# Patient Record
Sex: Female | Born: 1950 | Race: White | Hispanic: No | Marital: Single | Smoking: Never smoker
Health system: Southern US, Community
[De-identification: ages and names within clinical notes are randomized; demographics above are authoritative.]

## PROBLEM LIST (undated history)

## (undated) DIAGNOSIS — E785 Hyperlipidemia, unspecified: Secondary | ICD-10-CM

## (undated) DIAGNOSIS — R079 Chest pain, unspecified: Secondary | ICD-10-CM

## (undated) DIAGNOSIS — F79 Unspecified intellectual disabilities: Secondary | ICD-10-CM

## (undated) DIAGNOSIS — F419 Anxiety disorder, unspecified: Secondary | ICD-10-CM

## (undated) DIAGNOSIS — K2100 Gastro-esophageal reflux disease with esophagitis, without bleeding: Secondary | ICD-10-CM

## (undated) DIAGNOSIS — D649 Anemia, unspecified: Secondary | ICD-10-CM

## (undated) DIAGNOSIS — B1920 Unspecified viral hepatitis C without hepatic coma: Secondary | ICD-10-CM

## (undated) DIAGNOSIS — R0609 Other forms of dyspnea: Secondary | ICD-10-CM

## (undated) DIAGNOSIS — F410 Panic disorder [episodic paroxysmal anxiety] without agoraphobia: Secondary | ICD-10-CM

## (undated) DIAGNOSIS — N19 Unspecified kidney failure: Secondary | ICD-10-CM

## (undated) DIAGNOSIS — H269 Unspecified cataract: Secondary | ICD-10-CM

## (undated) DIAGNOSIS — F840 Autistic disorder: Secondary | ICD-10-CM

## (undated) DIAGNOSIS — K209 Esophagitis, unspecified without bleeding: Secondary | ICD-10-CM

## (undated) DIAGNOSIS — R0600 Dyspnea, unspecified: Secondary | ICD-10-CM

## (undated) DIAGNOSIS — I10 Essential (primary) hypertension: Secondary | ICD-10-CM

## (undated) DIAGNOSIS — F0390 Unspecified dementia without behavioral disturbance: Secondary | ICD-10-CM

## (undated) DIAGNOSIS — I351 Nonrheumatic aortic (valve) insufficiency: Secondary | ICD-10-CM

## (undated) DIAGNOSIS — F6381 Intermittent explosive disorder: Secondary | ICD-10-CM

## (undated) HISTORY — DX: Hyperlipidemia, unspecified: E78.5

## (undated) HISTORY — PX: ABDOMINAL HYSTERECTOMY: SHX81

## (undated) HISTORY — DX: Nonrheumatic aortic (valve) insufficiency: I35.1

## (undated) HISTORY — DX: Dyspnea, unspecified: R06.00

## (undated) HISTORY — PX: APPENDECTOMY: SHX54

## (undated) HISTORY — DX: Unspecified viral hepatitis C without hepatic coma: B19.20

## (undated) HISTORY — DX: Other forms of dyspnea: R06.09

## (undated) HISTORY — DX: Unspecified intellectual disabilities: F79

## (undated) HISTORY — DX: Anemia, unspecified: D64.9

## (undated) HISTORY — DX: Gastro-esophageal reflux disease with esophagitis: K21.0

## (undated) HISTORY — DX: Chest pain, unspecified: R07.9

## (undated) HISTORY — DX: Gastro-esophageal reflux disease with esophagitis, without bleeding: K21.00

---

## 2004-11-10 ENCOUNTER — Ambulatory Visit: Payer: Self-pay | Admitting: Internal Medicine

## 2008-03-05 ENCOUNTER — Emergency Department: Payer: Self-pay | Admitting: Emergency Medicine

## 2008-07-18 ENCOUNTER — Ambulatory Visit: Payer: Self-pay | Admitting: Unknown Physician Specialty

## 2008-09-03 ENCOUNTER — Ambulatory Visit: Payer: Self-pay | Admitting: Unknown Physician Specialty

## 2008-09-11 ENCOUNTER — Inpatient Hospital Stay: Payer: Self-pay | Admitting: Unknown Physician Specialty

## 2009-06-02 ENCOUNTER — Ambulatory Visit: Payer: Self-pay | Admitting: Internal Medicine

## 2009-06-05 ENCOUNTER — Ambulatory Visit: Payer: Self-pay | Admitting: Internal Medicine

## 2009-07-17 ENCOUNTER — Observation Stay: Payer: Self-pay | Admitting: Internal Medicine

## 2009-12-08 ENCOUNTER — Ambulatory Visit: Payer: Self-pay | Admitting: Internal Medicine

## 2010-06-09 ENCOUNTER — Ambulatory Visit: Payer: Self-pay | Admitting: Internal Medicine

## 2010-12-27 ENCOUNTER — Emergency Department: Payer: Self-pay | Admitting: Emergency Medicine

## 2011-06-30 ENCOUNTER — Ambulatory Visit: Payer: Self-pay | Admitting: Internal Medicine

## 2012-01-18 ENCOUNTER — Ambulatory Visit: Payer: Self-pay | Admitting: Internal Medicine

## 2012-12-18 ENCOUNTER — Ambulatory Visit: Payer: Self-pay | Admitting: Internal Medicine

## 2014-05-20 ENCOUNTER — Ambulatory Visit: Payer: Self-pay | Admitting: Internal Medicine

## 2014-10-07 ENCOUNTER — Emergency Department: Payer: Self-pay | Admitting: Emergency Medicine

## 2014-11-24 ENCOUNTER — Ambulatory Visit: Payer: Self-pay | Admitting: Ophthalmology

## 2015-10-01 ENCOUNTER — Other Ambulatory Visit: Payer: Self-pay | Admitting: Internal Medicine

## 2015-10-01 ENCOUNTER — Ambulatory Visit
Admission: RE | Admit: 2015-10-01 | Discharge: 2015-10-01 | Disposition: A | Discharge status: Home / Self Care | Payer: Medicare Other | Source: Ambulatory Visit | Attending: Internal Medicine | Admitting: Internal Medicine

## 2015-10-01 DIAGNOSIS — R1084 Generalized abdominal pain: Secondary | ICD-10-CM

## 2015-10-02 ENCOUNTER — Ambulatory Visit
Admission: RE | Admit: 2015-10-02 | Discharge: 2015-10-02 | Disposition: A | Discharge status: Home / Self Care | Payer: Medicare Other | Source: Ambulatory Visit | Attending: Internal Medicine | Admitting: Internal Medicine

## 2015-10-02 ENCOUNTER — Other Ambulatory Visit: Payer: Self-pay | Admitting: Internal Medicine

## 2015-10-02 DIAGNOSIS — N281 Cyst of kidney, acquired: Secondary | ICD-10-CM | POA: Insufficient documentation

## 2015-10-02 DIAGNOSIS — K760 Fatty (change of) liver, not elsewhere classified: Secondary | ICD-10-CM | POA: Insufficient documentation

## 2015-10-02 DIAGNOSIS — R0609 Other forms of dyspnea: Principal | ICD-10-CM

## 2015-10-02 DIAGNOSIS — R1084 Generalized abdominal pain: Secondary | ICD-10-CM | POA: Insufficient documentation

## 2015-10-06 ENCOUNTER — Ambulatory Visit: Payer: Medicare Other

## 2015-10-09 ENCOUNTER — Ambulatory Visit
Admission: RE | Admit: 2015-10-09 | Discharge: 2015-10-09 | Disposition: A | Discharge status: Home / Self Care | Payer: Medicare Other | Source: Ambulatory Visit | Attending: Internal Medicine | Admitting: Internal Medicine

## 2015-10-09 DIAGNOSIS — R0609 Other forms of dyspnea: Secondary | ICD-10-CM | POA: Diagnosis not present

## 2015-10-09 DIAGNOSIS — R918 Other nonspecific abnormal finding of lung field: Secondary | ICD-10-CM | POA: Insufficient documentation

## 2015-10-09 DIAGNOSIS — K449 Diaphragmatic hernia without obstruction or gangrene: Secondary | ICD-10-CM | POA: Diagnosis not present

## 2015-10-09 HISTORY — DX: Essential (primary) hypertension: I10

## 2015-10-09 LAB — POCT I-STAT CREATININE: Creatinine, Ser: 0.8 mg/dL (ref 0.44–1.00)

## 2015-10-09 MED ORDER — IOHEXOL 350 MG/ML SOLN
75.0000 mL | Freq: Once | INTRAVENOUS | Status: AC | PRN
  Administered 2015-10-09: 75 mL via INTRAVENOUS

## 2015-10-22 ENCOUNTER — Inpatient Hospital Stay: Payer: Medicare Other | Admitting: Cardiothoracic Surgery

## 2015-10-23 ENCOUNTER — Inpatient Hospital Stay: Payer: Medicare Other | Admitting: Cardiothoracic Surgery

## 2015-10-27 DIAGNOSIS — R0681 Apnea, not elsewhere classified: Secondary | ICD-10-CM | POA: Insufficient documentation

## 2015-10-29 ENCOUNTER — Inpatient Hospital Stay: Payer: Medicare Other | Attending: Cardiothoracic Surgery | Admitting: Cardiothoracic Surgery

## 2015-10-29 ENCOUNTER — Encounter: Payer: Self-pay | Admitting: *Deleted

## 2015-10-29 ENCOUNTER — Encounter: Payer: Self-pay | Admitting: Cardiothoracic Surgery

## 2015-10-29 VITALS — BP 149/73 | HR 62 | Temp 96.8°F | Resp 18 | Ht 65.35 in | Wt 180.1 lb

## 2015-10-29 DIAGNOSIS — R0602 Shortness of breath: Secondary | ICD-10-CM | POA: Insufficient documentation

## 2015-10-29 DIAGNOSIS — R635 Abnormal weight gain: Secondary | ICD-10-CM | POA: Insufficient documentation

## 2015-10-29 DIAGNOSIS — Z809 Family history of malignant neoplasm, unspecified: Secondary | ICD-10-CM | POA: Insufficient documentation

## 2015-10-29 DIAGNOSIS — K210 Gastro-esophageal reflux disease with esophagitis: Secondary | ICD-10-CM | POA: Diagnosis not present

## 2015-10-29 DIAGNOSIS — R918 Other nonspecific abnormal finding of lung field: Secondary | ICD-10-CM | POA: Insufficient documentation

## 2015-10-29 DIAGNOSIS — Z79899 Other long term (current) drug therapy: Secondary | ICD-10-CM | POA: Insufficient documentation

## 2015-10-29 DIAGNOSIS — J029 Acute pharyngitis, unspecified: Secondary | ICD-10-CM | POA: Insufficient documentation

## 2015-10-29 DIAGNOSIS — H9190 Unspecified hearing loss, unspecified ear: Secondary | ICD-10-CM | POA: Insufficient documentation

## 2015-10-29 DIAGNOSIS — Z8619 Personal history of other infectious and parasitic diseases: Secondary | ICD-10-CM | POA: Insufficient documentation

## 2015-10-29 DIAGNOSIS — F419 Anxiety disorder, unspecified: Secondary | ICD-10-CM | POA: Diagnosis not present

## 2015-10-29 DIAGNOSIS — I10 Essential (primary) hypertension: Secondary | ICD-10-CM | POA: Insufficient documentation

## 2015-10-29 DIAGNOSIS — F79 Unspecified intellectual disabilities: Secondary | ICD-10-CM | POA: Diagnosis not present

## 2015-10-29 DIAGNOSIS — Z862 Personal history of diseases of the blood and blood-forming organs and certain disorders involving the immune mechanism: Secondary | ICD-10-CM | POA: Diagnosis not present

## 2015-10-29 DIAGNOSIS — E785 Hyperlipidemia, unspecified: Secondary | ICD-10-CM | POA: Insufficient documentation

## 2015-10-29 NOTE — Progress Notes (Signed)
Patient ID: Melody Stone, female   DOB: 1950-10-24, 65 y.o.   MRN: [id number]  Chief Complaint  Patient presents with  . New Evaluation    abnormal chest CT    Referred By Dr. Bethann Punches Reason for Referral right lung mass  HPI Location, Quality, Duration, Severity, Timing, Context, Modifying Factors, Associated Signs and Symptoms.  Melody Stone is a 65 y.o. female.  Melody Stone is a very pleasant 65 year old woman with a history of developmental delay. She has been living in a assisted living facility and has noticed enlarging abdominal girth. She is accompanied today by one of the caregivers who endorses an enlarging abdominal girth for which she sought attention with Dr. Bethann Punches. The patient is weight every month or so at the facility and although there is been no significant weight gain that has been noticed that her abdominal girth appears to be increasing. A CT scan was performed by Dr. Hyacinth Meeker and this revealed some borderline mediastinal lymph nodes as well as a irregularly shaped lesion in the right lower lobe that appeared more like atelectasis than mass. The patient does not smoke and has never been exposed to secondhand smoke. There is no family history of lung cancer as we can tell.   Past Medical History  Diagnosis Date  . Hypertension   . Mental retardation   . HLD (hyperlipidemia)   . Anemia   . Reflux esophagitis   . Hepatitis C     Past Surgical History  Procedure Laterality Date  . Abdominal hysterectomy    . Appendectomy      Family History  Problem Relation Age of Onset  . Heart attack Mother   . Cancer Mother     Social History Social History  Substance Use Topics  . Smoking status: Never Smoker   . Smokeless tobacco: Never Used  . Alcohol Use: No    Allergies  Allergen Reactions  . Other Other (See Comments)    Psychotropic drugs cause kidney failure. Psychotropic drug cause renal failure. Psychotropic drugs cause kidney failure.     Current Outpatient Prescriptions  Medication Sig Dispense Refill  . Cholecalciferol (VITAMIN D3) 1000 units CAPS Take by mouth.    . clonazePAM (KLONOPIN) 0.5 MG tablet Take by mouth.    Marland Kitchen DM-APAP-CPM 15-500-2 MG TABS Take by mouth.    . fluticasone (FLONASE) 50 MCG/ACT nasal spray Place into the nose.    . hydrocortisone (ANUSOL-HC) 2.5 % rectal cream Apply topically.    Marland Kitchen labetalol (NORMODYNE) 200 MG tablet Take by mouth.    Marland Kitchen PARoxetine (PAXIL) 20 MG tablet Take by mouth.    . ranitidine (ZANTAC) 150 MG tablet Take by mouth.    . senna-docusate (SENOKOT-S) 8.6-50 MG tablet Take by mouth.     No current facility-administered medications for this visit.      Review of Systems A complete review of systems was asked and was negative except for the following positive findingsWeight gain, hearing difficulty, shortness of breath, sore throat, anxiety.  Blood pressure 149/73, pulse 62, temperature 96.8 F (36 C), temperature source Tympanic, resp. rate 18, height 5' 5.35" (1.66 m), weight 180 lb 1.9 oz (81.7 kg), SpO2 99 %.  Physical Exam CONSTITUTIONAL:  Pleasant, well-developed, well-nourished, and in no acute distress. EYES: Pupils equal and reactive to light, Sclera non-icteric EARS, NOSE, MOUTH AND THROAT:  The oropharynx was clear.  Dentition is poor repair.  Oral mucosa pink and moist. LYMPH NODES:  Lymph nodes in  the neck and axillae were normal RESPIRATORY:  Lungs were clear.  Normal respiratory effort without pathologic use of accessory muscles of respiration CARDIOVASCULAR: Heart was regular without murmurs.  There were no carotid bruits. GI: The abdomen was soft, nontender, and nondistended. There were no palpable masses. There was no hepatosplenomegaly. There were normal bowel sounds in all quadrants. GU:  Rectal deferred.   MUSCULOSKELETAL:  Normal muscle strength and tone.  No clubbing or cyanosis.   SKIN:  There were no pathologic skin lesions.  There were no nodules  on palpation.    Data Reviewed CT scan  I have personally reviewed the patient's imaging, laboratory findings and medical records.    Assessment    I have independently reviewed the patient's CT scan. There are some borderline mediastinal lymph nodes as well as a irregular right middle lobe mass. My index of suspicion is quite low for any significant pathology.    Plan    I would like to repeat a chest CT in 3 months time. We've ordered that and she will come back to see me and further follow-up of her mediastinal lymph nodes and her right lung mass. Did discuss this with the caregiver who is present and she is in agreement.      Hulda Marin, MD 10/29/2015, 5:41 PM

## 2015-10-29 NOTE — Progress Notes (Signed)
Patient is accompanied Kriste Basque a caretaker at the group home, [facility] where she lives.  Patient has new SOBr and abdominal bloating so she is receiving a work up from PCP.  During this work-up she had a chest CT performed that did show an abnormality that her PCP would like her to see Dr. Thelma Barge regarding.

## 2015-10-30 NOTE — Progress Notes (Signed)
Met with patient at initial thoracic surgery appointment. Introduced navigation program and reviewed plan of care. Will follow. 

## 2016-01-28 ENCOUNTER — Ambulatory Visit: Payer: Medicare Other

## 2016-01-28 ENCOUNTER — Ambulatory Visit: Payer: Medicare Other | Admitting: Cardiothoracic Surgery

## 2016-01-29 ENCOUNTER — Ambulatory Visit: Payer: Self-pay | Admitting: Cardiothoracic Surgery

## 2016-02-04 ENCOUNTER — Ambulatory Visit
Admission: RE | Admit: 2016-02-04 | Discharge: 2016-02-04 | Disposition: A | Discharge status: Home / Self Care | Payer: Medicare Other | Source: Ambulatory Visit | Attending: Cardiothoracic Surgery | Admitting: Cardiothoracic Surgery

## 2016-02-04 DIAGNOSIS — R918 Other nonspecific abnormal finding of lung field: Secondary | ICD-10-CM | POA: Diagnosis present

## 2016-02-04 DIAGNOSIS — K449 Diaphragmatic hernia without obstruction or gangrene: Secondary | ICD-10-CM | POA: Diagnosis not present

## 2016-02-04 DIAGNOSIS — R911 Solitary pulmonary nodule: Secondary | ICD-10-CM | POA: Diagnosis not present

## 2016-02-04 DIAGNOSIS — I2510 Atherosclerotic heart disease of native coronary artery without angina pectoris: Secondary | ICD-10-CM | POA: Diagnosis not present

## 2016-02-19 ENCOUNTER — Ambulatory Visit (INDEPENDENT_AMBULATORY_CARE_PROVIDER_SITE_OTHER): Payer: Medicare Other | Admitting: Cardiothoracic Surgery

## 2016-02-19 ENCOUNTER — Encounter: Payer: Self-pay | Admitting: Cardiothoracic Surgery

## 2016-02-19 ENCOUNTER — Telehealth: Payer: Self-pay

## 2016-02-19 VITALS — BP 130/52 | HR 72 | Temp 97.5°F | Wt 181.0 lb

## 2016-02-19 DIAGNOSIS — B1920 Unspecified viral hepatitis C without hepatic coma: Secondary | ICD-10-CM | POA: Insufficient documentation

## 2016-02-19 DIAGNOSIS — I10 Essential (primary) hypertension: Secondary | ICD-10-CM | POA: Insufficient documentation

## 2016-02-19 DIAGNOSIS — R911 Solitary pulmonary nodule: Secondary | ICD-10-CM

## 2016-02-19 DIAGNOSIS — K2100 Gastro-esophageal reflux disease with esophagitis, without bleeding: Secondary | ICD-10-CM | POA: Insufficient documentation

## 2016-02-19 DIAGNOSIS — F79 Unspecified intellectual disabilities: Secondary | ICD-10-CM | POA: Insufficient documentation

## 2016-02-19 DIAGNOSIS — E785 Hyperlipidemia, unspecified: Secondary | ICD-10-CM | POA: Insufficient documentation

## 2016-02-19 NOTE — Telephone Encounter (Signed)
I sent a letter to patient's caregiver with patient's CT Scan appointment and her lab slip. I will call patient back once it gets closer to mid December so she could follow up with Dr. Thelma Bargeaks.

## 2016-02-19 NOTE — Addendum Note (Signed)
Addended by: Adela PortsBONICHE, Currie Dennin on: 02/19/2016 09:29 AM   Modules accepted: Orders

## 2016-02-19 NOTE — Progress Notes (Signed)
[facility] Inpatient Post-Op Note  Patient ID: Melody Stone, female   DOB: 1951-03-06, 66 y.o.   MRN: [id number]  HISTORY: Patient is a 65 year old woman who is accompanied today by her caregiver from the nursing facility. She is mentally handicapped and is unable to provide a meaningful history. According to the caregiver who I have met in the past and has an excellent fund of knowledge regarding her care she has been complaining of more shortness of breath and also some abdominal distention. She sees gastroenterology and cardiology and no further workup or evaluation has been entertained. An attempt a pulmonary function studies were unsuccessful due to the patient's inability to cooperate. She did have a CT scan the chest made 2 weeks ago. And I have independently reviewed that and shared that with the caregiver.   Filed Vitals:   02/19/16 0900  BP: 130/52  Pulse: 72  Temp: 97.5 F (36.4 C)     EXAM: Resp: Lungs are clear bilaterally.  No respiratory distress, normal effort. Heart:  Regular without murmurs Abd:  Abdomen is soft, non distended and non tender. No masses are palpable.  There is no rebound and no guarding.   Skin: Skin is warm and dry. No rash noted. No diaphoretic. No erythema. No pallor.     ASSESSMENT: I have independently reviewed the patient's CT scan. The nodule in the right lower lobe is stable from February. I do not see any other additional pathology within the chest or upper abdomen to explain the patient's symptoms of shortness of breath.   PLAN:   We will bring the patient back again in 6 months with a repeat CT of chest. I have also asked the caregiver to record the patient's abdominal girth every day and to keep a diary of this. 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 She is scheduled to follow-up with her primary caregiver Dr. Emily Filbert.    Nestor Lewandowsky, MD

## 2016-02-19 NOTE — Patient Instructions (Signed)
Can you please measure her abdomen regularly so you see if it shows any difference through time. Please try to measure it at either the beginning of the day or the end of the day. Please show this log to Dr. Hyacinth MeekerMiller.  We will ask for you to have a repeat of your CT Scan Chest in 6 months.  We will see you in six months.

## 2016-03-30 DIAGNOSIS — I351 Nonrheumatic aortic (valve) insufficiency: Secondary | ICD-10-CM | POA: Insufficient documentation

## 2016-03-30 DIAGNOSIS — R911 Solitary pulmonary nodule: Secondary | ICD-10-CM | POA: Insufficient documentation

## 2016-06-03 ENCOUNTER — Other Ambulatory Visit: Payer: Self-pay | Admitting: Family Medicine

## 2016-06-03 DIAGNOSIS — R1310 Dysphagia, unspecified: Secondary | ICD-10-CM

## 2016-06-21 ENCOUNTER — Ambulatory Visit: Payer: Medicare Other

## 2016-07-04 ENCOUNTER — Ambulatory Visit
Admission: RE | Admit: 2016-07-04 | Discharge: 2016-07-04 | Disposition: A | Discharge status: Home / Self Care | Payer: Medicare Other | Source: Ambulatory Visit | Attending: Family Medicine | Admitting: Family Medicine

## 2016-07-04 DIAGNOSIS — R1310 Dysphagia, unspecified: Secondary | ICD-10-CM | POA: Insufficient documentation

## 2016-07-04 DIAGNOSIS — R1311 Dysphagia, oral phase: Secondary | ICD-10-CM

## 2016-07-04 NOTE — Therapy (Signed)
[facility] DI[facility]STIC RADIOLOGY [address] [place], [place], [address] Phone: [phone]   Fax:     Modified Barium Swallow  Patient Details  Name: Melody BatmanSandra Kay Radu MRN: [id number] Date of Birth: Nov 15, 1950 No Data Recorded  Encounter Date: 07/04/2016      End of Session - 07/04/16 1350    Visit Number 1   Number of Visits 1   Date for SLP Re-Evaluation 07/04/16   SLP Start Time 1300   SLP Stop Time  1345   SLP Time Calculation (min) 45 min   Activity Tolerance Patient tolerated treatment well      Past Medical History:  Diagnosis Date  . Anemia   . Hepatitis C   . HLD (hyperlipidemia)   . Hypertension   . Mental retardation   . Reflux esophagitis     Past Surgical History:  Procedure Laterality Date  . ABDOMINAL HYSTERECTOMY    . APPENDECTOMY      There were no vitals filed for this visit.   Subjective: Patient behavior: (alertness, ability to follow instructions, etc.): Patient is intellectually disabled but able to follow simple directions  Chief complaint" "she does not chew her food, just swallows it"   Objective:  Radiological Procedure: A videoflouroscopic evaluation of oral-preparatory, reflex initiation, and pharyngeal phases of the swallow was performed; as well as a screening of the upper esophageal phase.  I. POSTURE: Upright in MBS chair; patient is restless and moves a lot.  Most of each swallow was successfully captured  II. VIEW: Lateral  III. COMPENSATORY STRATEGIES: N/A  IV. BOLUSES ADMINISTERED:   Thin Liquid: 3 rapid consecutive   Nectar-thick Liquid: 1 moderate size    Puree: 2 teaspoon presentations   Mechanical Soft: 1/4 graham cracker in applesauce  V. RESULTS OF EVALUATION: A. ORAL PREPARATORY PHASE: (The lips, tongue, and velum are observed for strength and coordination)       **Overall Severity Rating: Mild; disorganized oral management and no chewing.  Patient used tongue to  push cracker in applesauce to roof of her mouth.  The applesauce softened the cracker and the tongue movement broke it into 2 pieces, which were swallowed whole.  B. SWALLOW INITIATION/REFLEX: (The reflex is normal if "triggered" by the time the bolus reached the base of the tongue)  **Overall Severity Rating: WNL  C. PHARYNGEAL PHASE: (Pharyngeal function is normal if the bolus shows rapid, smooth, and continuous transit through the pharynx and there is no pharyngeal residue after the swallow)  **Overall Severity Rating: WNL  D. LARYNGEAL PENETRATION: (Material entering into the laryngeal inlet/vestibule but not aspirated) None  E. ASPIRATION: None  F. ESOPHAGEAL PHASE: (Screening of the upper esophagus): Aerophagia, otherwise no abnormality within the observable esopohagus  ASSESSMENT: This 65 year old woman, with intellectual disability and observation that "she does not chew her food, she just swallows it", is presenting with mild oral dysphagia characterized by disorganized oral management and absent mastication of a graham cracker.  Timing of the pharyngeal swallow is within normal limits.  Aspects of the pharyngeal stage of swallowing including tongue base retraction, hyolaryngeal excursion, epiglottic inversion, duration/amplitude of UES opening, and laryngeal vestibule closure at the height of the swallow are within normal limits.  There is no vallecular residue.  There was no observed laryngeal penetration or aspiration.  In view of lack of mastication, the patient is at risk for choking.  Recommend home health speech therapy consult to determine appropriate diet consistency and feeding  guidelines.  In the meantime, offer foods that do not require chewing and are inherently cohesive.  PLAN/RECOMMENDATIONS:   A. Diet: Dysphagia III with extra sauces/gravies; thin liquid   B. Swallowing Precautions: offer foods that do not require chewing and are inherently cohesive   C. Recommended  consultation to: behavioral specialist to implement appropriate feeding program   D. Therapy recommendations: home health speech therapy consult to determine appropriate diet consistency and feeding guidelines   E. Results and recommendations were discussed with the patient's caregiver and final report routed to Dr. Hyacinth MeekerMiller.  Patient will benefit from skilled therapeutic intervention in order to improve the following deficits and impairments:   Oral phase dysphagia - Plan: DG OP Swallowing Func-Medicare/Speech Path, DG OP Swallowing Func-Medicare/Speech Path      G-Codes - 07/04/16 1351    Functional Assessment Tool Used MBS, clinical judgment   Functional Limitations Swallowing   Swallow Current Status ([id number](G8996) At least 20 percent but less than 40 percent impaired, limited or restricted   Swallow Goal Status (U0454(G8997) At least 20 percent but less than 40 percent impaired, limited or restricted   Swallow Discharge Status [phone]([id number]) At least 20 percent but less than 40 percent impaired, limited or restricted          Problem List Patient Active Problem List   Diagnosis Date Noted  . HCV (hepatitis C virus) 02/19/2016  . HLD (hyperlipidemia) 02/19/2016  . BP (high blood pressure) 02/19/2016  . Intellectual disability 02/19/2016  . Esophagitis, reflux 02/19/2016  . Breathlessness on exertion 10/27/2015   Dollene PrimroseSusan G Kaelyn Nauta, MS/CCC- SLP  Leandrew KoyanagiAbernathy, Susie 07/04/2016, 1:52 PM  [facility] [address] [place], [place], [address] Phone: [phone]   Fax:     Name: Melody BatmanSandra Kay Wild MRN: [id number] Date of Birth: October 26, 1950

## 2016-07-08 DIAGNOSIS — R1312 Dysphagia, oropharyngeal phase: Secondary | ICD-10-CM | POA: Insufficient documentation

## 2016-07-14 ENCOUNTER — Other Ambulatory Visit: Payer: Self-pay | Admitting: Internal Medicine

## 2016-07-14 DIAGNOSIS — Z1231 Encounter for screening mammogram for malignant neoplasm of breast: Secondary | ICD-10-CM

## 2016-08-10 ENCOUNTER — Telehealth: Payer: Self-pay

## 2016-08-10 NOTE — Telephone Encounter (Signed)
Called patient and spoke to her caregiver. She wanted to make sure of all the dates that the patient had for appointments. I gave her the appointment for CT Scan and follow up appointment with Dr. Thelma Bargeaks. I also reminded her of her lab. They did not have any further questions.

## 2016-08-10 NOTE — Telephone Encounter (Signed)
Spoke with patients caregiver at this time in regards to Labs, Ct and [facility]  Caregiver stated someone was going to notify them and they haven't received a phone call. I told patient there is  A note in the chart regarding Ct and labs and that a letter was mailed with the appointment times and dates. I let the caregiver know  would call Meritza so she could reach out to the patient and caregiver to confirm appointments.   Spoke with Meritza at this time and Meritza stated she would call patient to confirm everything.

## 2016-08-12 ENCOUNTER — Other Ambulatory Visit
Admission: RE | Admit: 2016-08-12 | Discharge: 2016-08-12 | Disposition: A | Discharge status: Home / Self Care | Payer: Medicare Other | Source: Ambulatory Visit | Attending: Cardiothoracic Surgery | Admitting: Cardiothoracic Surgery

## 2016-08-12 DIAGNOSIS — R911 Solitary pulmonary nodule: Secondary | ICD-10-CM | POA: Diagnosis present

## 2016-08-12 LAB — BASIC METABOLIC PANEL
ANION GAP: 3 — AB (ref 5–15)
BUN: 15 mg/dL (ref 6–20)
CHLORIDE: 108 mmol/L (ref 101–111)
CO2: 24 mmol/L (ref 22–32)
Calcium: 9.7 mg/dL (ref 8.9–10.3)
Creatinine, Ser: 0.79 mg/dL (ref 0.44–1.00)
GFR calc non Af Amer: >60 mL/min (ref >60)
Glucose, Bld: 119 mg/dL — ABNORMAL HIGH (ref 65–99)
POTASSIUM: 3.8 mmol/L (ref 3.5–5.1)
Sodium: 135 mmol/L (ref 135–145)

## 2016-08-15 ENCOUNTER — Ambulatory Visit: Payer: Medicare Other

## 2016-08-19 ENCOUNTER — Ambulatory Visit: Payer: Medicare Other | Admitting: Cardiothoracic Surgery

## 2016-08-26 ENCOUNTER — Ambulatory Visit: Payer: Medicare Other | Attending: Internal Medicine

## 2016-08-31 ENCOUNTER — Telehealth: Payer: Self-pay

## 2016-08-31 NOTE — Telephone Encounter (Signed)
Called patient's guardian and was not able to leave a voicemail.  Patient was to have a CT Scan and then needs to follow up with Dr. Thelma Bargeaks. Patient's caregiver had called and cancelled patient's CT Scan and appointment due to patient being sick. These two things have to be rescheduled in case caregiver calls us back.  For CT Scan, she will have to call [phone].

## 2016-09-05 NOTE — Telephone Encounter (Signed)
Called patient and spoke with her caregiver Kriste BasqueBecky at [phone]. I told Kriste BasqueBecky that I was calling her to reschedule Ardeth's CT Scan appointment and appointment with Dr. Thelma Bargeaks. Becky wanted me to give her the number for Central Scheduling so she could call. This was provided. I then told her to call me once she had scheduled it so I could schedule an appointment with Dr. Thelma Bargeaks. I told Kriste BasqueBecky that Dr. Thelma Bargeaks only worked on Friday's. She understood and stated that she would call me back.

## 2016-09-05 NOTE — Telephone Encounter (Signed)
-----   Message from StuartMaritza Zachory [place], [place] sent at 08/26/2016  9:05 AM EST ----- Regarding: RE: CT and Appt. Look and see if patient will reschedule to have her CT Scan done and then reschedule her appointment with Dr. Thelma Bargeaks after CT Scan.   ----- Message ----- From: Adela PortsMaritza Guiseppe Flanagan, CMA Sent: 08/15/2016 To: Adela PortsMaritza Shalayna Ornstein, CMA Subject: CT and Appt.                                   Schedule patient a CT Scan Chest and then schedule a follow up with Dr. Thelma Bargeaks to go over her results.

## 2016-09-09 NOTE — Telephone Encounter (Signed)
Looked into the patient's chart and saw that Kriste BasqueBecky (patient's caregiver) had not scheduled patient's CT Scan and follow up appointment with Dr. Thelma Bargeaks. I will send Kriste BasqueBecky a letter to remind her again and hopefully she would call to reschedule both appointments.

## 2016-09-12 ENCOUNTER — Ambulatory Visit
Admission: RE | Admit: 2016-09-12 | Discharge: 2016-09-12 | Disposition: A | Discharge status: Home / Self Care | Payer: Medicare Other | Source: Ambulatory Visit | Attending: Cardiothoracic Surgery | Admitting: Cardiothoracic Surgery

## 2016-09-12 DIAGNOSIS — R911 Solitary pulmonary nodule: Secondary | ICD-10-CM | POA: Diagnosis not present

## 2016-09-12 DIAGNOSIS — I700 Atherosclerosis of aorta: Secondary | ICD-10-CM | POA: Insufficient documentation

## 2016-09-12 DIAGNOSIS — I2510 Atherosclerotic heart disease of native coronary artery without angina pectoris: Secondary | ICD-10-CM | POA: Insufficient documentation

## 2016-09-16 ENCOUNTER — Ambulatory Visit: Payer: Medicare Other | Admitting: Cardiothoracic Surgery

## 2016-09-22 ENCOUNTER — Ambulatory Visit: Payer: Self-pay | Admitting: Cardiothoracic Surgery

## 2016-10-12 ENCOUNTER — Ambulatory Visit
Admission: RE | Admit: 2016-10-12 | Discharge: 2016-10-12 | Disposition: A | Discharge status: Home / Self Care | Payer: Medicare Other | Source: Ambulatory Visit | Attending: Internal Medicine | Admitting: Internal Medicine

## 2016-10-12 DIAGNOSIS — Z1231 Encounter for screening mammogram for malignant neoplasm of breast: Secondary | ICD-10-CM

## 2017-01-05 ENCOUNTER — Encounter: Payer: Self-pay | Admitting: *Deleted

## 2017-01-05 ENCOUNTER — Emergency Department: Payer: Medicare Other

## 2017-01-05 ENCOUNTER — Emergency Department
Admission: EM | Admit: 2017-01-05 | Discharge: 2017-01-05 | Disposition: A | Discharge status: Home / Self Care | Payer: Medicare Other | Attending: Emergency Medicine | Admitting: Emergency Medicine

## 2017-01-05 DIAGNOSIS — I10 Essential (primary) hypertension: Secondary | ICD-10-CM | POA: Insufficient documentation

## 2017-01-05 DIAGNOSIS — Z79899 Other long term (current) drug therapy: Secondary | ICD-10-CM | POA: Insufficient documentation

## 2017-01-05 DIAGNOSIS — R079 Chest pain, unspecified: Secondary | ICD-10-CM | POA: Diagnosis present

## 2017-01-05 LAB — BASIC METABOLIC PANEL
ANION GAP: 6 (ref 5–15)
BUN: 17 mg/dL (ref 6–20)
CALCIUM: 9.5 mg/dL (ref 8.9–10.3)
CO2: 25 mmol/L (ref 22–32)
Chloride: 107 mmol/L (ref 101–111)
Creatinine, Ser: 0.68 mg/dL (ref 0.44–1.00)
GFR calc Af Amer: >60 mL/min (ref >60)
GLUCOSE: 106 mg/dL — AB (ref 65–99)
Potassium: 3.9 mmol/L (ref 3.5–5.1)
Sodium: 138 mmol/L (ref 135–145)

## 2017-01-05 LAB — CBC
HCT: 32.9 % — ABNORMAL LOW (ref 35.0–47.0)
HEMOGLOBIN: 11.8 g/dL — AB (ref 12.0–16.0)
MCH: 30.8 pg (ref 26.0–34.0)
MCHC: 35.7 g/dL (ref 32.0–36.0)
MCV: 86.1 fL (ref 80.0–100.0)
Platelets: 250 10*3/uL (ref 150–440)
RBC: 3.83 MIL/uL (ref 3.80–5.20)
RDW: 15.2 % — AB (ref 11.5–14.5)
WBC: 7.8 10*3/uL (ref 3.6–11.0)

## 2017-01-05 LAB — TROPONIN I

## 2017-01-05 MED ORDER — ACETAMINOPHEN 500 MG PO TABS
1000.0000 mg | ORAL_TABLET | Freq: Once | ORAL | Status: AC
  Administered 2017-01-05: 1000 mg via ORAL
  Filled 2017-01-05: qty 2

## 2017-01-05 NOTE — ED Provider Notes (Signed)
[facility] Emergency Department Provider Note       Time seen: ----------------------------------------- 1:25 PM on 01/05/2017 -----------------------------------------  L5 caveat: Review of systems and history is limited by MR.   I have reviewed the triage vital signs and the nursing notes.   HISTORY   Chief Complaint Chest Pain    HPI Melody Stone is a 66 y.o. female who presents to the ED for chest pain. Patient arrived via EMS from her work while doing a crossword, she reports she was having some chest pain. She is unable to describe the pain as she has a history of mental retardation. No other symptoms are known. She did describe it as an ache.   Past Medical History:  Diagnosis Date  . Anemia   . Hepatitis C   . HLD (hyperlipidemia)   . Hypertension   . Mental retardation   . Reflux esophagitis     Patient Active Problem List   Diagnosis Date Noted  . HCV (hepatitis C virus) 02/19/2016  . HLD (hyperlipidemia) 02/19/2016  . BP (high blood pressure) 02/19/2016  . Intellectual disability 02/19/2016  . Esophagitis, reflux 02/19/2016  . Breathlessness on exertion 10/27/2015    Past Surgical History:  Procedure Laterality Date  . ABDOMINAL HYSTERECTOMY    . APPENDECTOMY      Allergies Other  Social History Social History  Substance Use Topics  . Smoking status: Never Smoker  . Smokeless tobacco: Never Used  . Alcohol use No    Review of Systems Constitutional: Negative for fever. Eyes: Negative for vision changes ENT:  Negative for congestion, sore throat Cardiovascular: Positive for chest pain Respiratory: Negative for shortness of breath. Gastrointestinal: Negative for abdominal pain, vomiting and diarrhea. Genitourinary: Negative for dysuria. Musculoskeletal: Negative for back pain. Skin: Negative for rash. Neurological: Negative for headaches, focal weakness or numbness.  All systems negative/normal/unremarkable  except as stated in the HPI  ____________________________________________   PHYSICAL EXAM:  VITAL SIGNS: ED Triage Vitals  Enc Vitals Group     BP 01/05/17 1319 (!) 154/62     Pulse Rate 01/05/17 1319 (!) 58     Resp 01/05/17 1319 16     Temp 01/05/17 1319 98.2 F (36.8 C)     Temp Source 01/05/17 1319 Oral     SpO2 01/05/17 1319 100 %     Weight 01/05/17 1320 200 lb (90.7 kg)     Height 01/05/17 1320 5\' 3"  (1.6 m)     Head Circumference --      Peak Flow --      Pain Score --      Pain Loc --      Pain Edu? --      Excl. in GC? --     Constitutional: Alert, Well appearing and in no distress. Eyes: Conjunctivae are normal. Normal extraocular movements. ENT   Head: Normocephalic and atraumatic.   Nose: No congestion/rhinnorhea.   Mouth/Throat: Mucous membranes are moist.   Neck: No stridor. Cardiovascular: Normal rate, regular rhythm. No murmurs, rubs, or gallops. Respiratory: Normal respiratory effort without tachypnea nor retractions. Breath sounds are clear and equal bilaterally. No wheezes/rales/rhonchi. Gastrointestinal: Soft and nontender. Normal bowel sounds Musculoskeletal: Nontender with normal range of motion in extremities. No lower extremity tenderness nor edema. Neurologic:  Normal speech and language. No gross focal neurologic deficits are appreciated.  Skin:  Skin is warm, dry and intact. No rash noted. Psychiatric: Mood is elevated but at her baseline according to family  ____________________________________________  EKG: Interpreted by me. Sinus rhythm with rate of 56 bpm, normal PR interval, normal QRS, borderline long QT  ____________________________________________  ED COURSE:  Pertinent labs & imaging results that were available during my care of the patient were reviewed by me and considered in my medical decision making (see chart for details). Patient presents for chest pain, we will assess with labs and imaging as indicated.    Procedures ____________________________________________   LABS (pertinent positives/negatives)  Labs Reviewed  BASIC METABOLIC PANEL - Abnormal; Notable for the following:       Result Value   Glucose, Bld 106 (*)    All other components within normal limits  CBC - Abnormal; Notable for the following:    Hemoglobin 11.8 (*)    HCT 32.9 (*)    RDW 15.2 (*)    All other components within normal limits  TROPONIN I  TROPONIN I    RADIOLOGY Images were viewed by me Chest x-ray IMPRESSION: No active disease.  ____________________________________________  FINAL ASSESSMENT AND PLAN  Chest pain  Plan: Patient's labs and imaging were dictated above. Patient had presented for Chest pain of uncertain etiology. Labs and EKG were reassuring. This likely noncardiac in origin. He'll be referred to cardiology for outpatient follow-up.   Emily Filbert, MD   Note: This note was generated in part or whole with voice recognition software. Voice recognition is usually quite accurate but there are transcription errors that can and very often do occur. I apologize for any typographical errors that were not detected and corrected.     Emily Filbert, MD 01/05/17 [phone]

## 2017-01-05 NOTE — ED Triage Notes (Signed)
Pt arrives via EMS from her work study when while doing a crossoword she told them her chest was hurting, pt arrives awake and alert, lives in a group home, pt has hx of MR and is unable to describe pain, caregivers at bedside

## 2017-01-09 ENCOUNTER — Emergency Department: Payer: Medicare Other

## 2017-01-09 ENCOUNTER — Emergency Department
Admission: EM | Admit: 2017-01-09 | Discharge: 2017-01-09 | Disposition: A | Discharge status: Home / Self Care | Payer: Medicare Other | Attending: Emergency Medicine | Admitting: Emergency Medicine

## 2017-01-09 ENCOUNTER — Telehealth: Payer: Self-pay

## 2017-01-09 ENCOUNTER — Encounter: Payer: Self-pay | Admitting: Emergency Medicine

## 2017-01-09 DIAGNOSIS — I10 Essential (primary) hypertension: Secondary | ICD-10-CM | POA: Diagnosis not present

## 2017-01-09 DIAGNOSIS — Z79899 Other long term (current) drug therapy: Secondary | ICD-10-CM | POA: Diagnosis not present

## 2017-01-09 DIAGNOSIS — H109 Unspecified conjunctivitis: Secondary | ICD-10-CM | POA: Insufficient documentation

## 2017-01-09 DIAGNOSIS — R42 Dizziness and giddiness: Secondary | ICD-10-CM | POA: Diagnosis present

## 2017-01-09 LAB — CBC
HCT: 33.7 % — ABNORMAL LOW (ref 35.0–47.0)
Hemoglobin: 12.2 g/dL (ref 12.0–16.0)
MCH: 31.4 pg (ref 26.0–34.0)
MCHC: 36.2 g/dL — AB (ref 32.0–36.0)
MCV: 86.8 fL (ref 80.0–100.0)
PLATELETS: 245 10*3/uL (ref 150–440)
RBC: 3.88 MIL/uL (ref 3.80–5.20)
RDW: 14.9 % — AB (ref 11.5–14.5)
WBC: 9.5 10*3/uL (ref 3.6–11.0)

## 2017-01-09 LAB — URINALYSIS, COMPLETE (UACMP) WITH MICROSCOPIC
BILIRUBIN URINE: NEGATIVE
Bacteria, UA: NONE SEEN
GLUCOSE, UA: NEGATIVE mg/dL
HGB URINE DIPSTICK: NEGATIVE
KETONES UR: NEGATIVE mg/dL
Leukocytes, UA: NEGATIVE
Nitrite: NEGATIVE
PH: 5 (ref 5.0–8.0)
Protein, ur: NEGATIVE mg/dL
SPECIFIC GRAVITY, URINE: 1.008 (ref 1.005–1.030)
SQUAMOUS EPITHELIAL / LPF: NONE SEEN

## 2017-01-09 LAB — COMPREHENSIVE METABOLIC PANEL
ALK PHOS: 93 U/L (ref 38–126)
ALT: 14 U/L (ref 14–54)
ANION GAP: 10 (ref 5–15)
AST: 21 U/L (ref 15–41)
Albumin: 4.1 g/dL (ref 3.5–5.0)
BILIRUBIN TOTAL: 0.7 mg/dL (ref 0.3–1.2)
BUN: 20 mg/dL (ref 6–20)
CALCIUM: 9.6 mg/dL (ref 8.9–10.3)
CO2: 22 mmol/L (ref 22–32)
Chloride: 104 mmol/L (ref 101–111)
Creatinine, Ser: 0.65 mg/dL (ref 0.44–1.00)
GFR calc non Af Amer: >60 mL/min (ref >60)
Glucose, Bld: 122 mg/dL — ABNORMAL HIGH (ref 65–99)
Potassium: 3.8 mmol/L (ref 3.5–5.1)
Sodium: 136 mmol/L (ref 135–145)
TOTAL PROTEIN: 7.6 g/dL (ref 6.5–8.1)

## 2017-01-09 LAB — TROPONIN I: Troponin I: <0.03 ng/mL (ref <0.03)

## 2017-01-09 MED ORDER — MECLIZINE HCL 25 MG PO TABS: 25.0000 mg | ORAL_TABLET | Freq: Three times a day (TID) | ORAL | 0 refills | Status: DC | PRN

## 2017-01-09 MED ORDER — ERYTHROMYCIN 5 MG/GM OP OINT: TOPICAL_OINTMENT | Freq: Three times a day (TID) | OPHTHALMIC | 0 refills | Status: AC

## 2017-01-09 NOTE — ED Notes (Signed)
Pt ambulated without difficulty, denies dizziness

## 2017-01-09 NOTE — Discharge Instructions (Signed)
Please seek medical attention for any high fevers, chest pain, shortness of breath, change in behavior, persistent vomiting, bloody stool or any other new or concerning symptoms.  

## 2017-01-09 NOTE — ED Triage Notes (Signed)
[facility] arrives with caregiver. States patient has complained of dizziness x 4 days. During triage when questioned if she has any pain patient points to chest.

## 2017-01-09 NOTE — ED Provider Notes (Signed)
[facility] Emergency Department Provider Note   ____________________________________________   I have reviewed the triage vital signs and the nursing notes.   HISTORY  Chief Complaint Dizziness   History limited by: Intelectual disability - most history obtained from caregiver   HPI Melody Stone is a 66 y.o. female who presents to the emergency department today accompanied by caregiver because of concern for instability with ambulation. Caregiver states that it was first noticed about four days ago. She thinks it started shortly after the patient was seen in the emergency department for chest pain. That work up was negative and caregiver states that she has not been complaining of any chest pain since then. The patient however has been unsteady on her feet. The caregiver states she has been leaning to her left. The caregiver denies any recent fevers, nausea, vomiting or change in urinary habbits. The patient cannot give any meaningful history.   Past Medical History:  Diagnosis Date  . Anemia   . Hepatitis C   . HLD (hyperlipidemia)   . Hypertension   . Mental retardation   . Reflux esophagitis     Patient Active Problem List   Diagnosis Date Noted  . HCV (hepatitis C virus) 02/19/2016  . HLD (hyperlipidemia) 02/19/2016  . BP (high blood pressure) 02/19/2016  . Intellectual disability 02/19/2016  . Esophagitis, reflux 02/19/2016  . Breathlessness on exertion 10/27/2015    Past Surgical History:  Procedure Laterality Date  . ABDOMINAL HYSTERECTOMY    . APPENDECTOMY      Prior to Admission medications   Medication Sig Start Date End Date Taking? Authorizing Provider  Cholecalciferol (VITAMIN D3) 1000 units CAPS Take 2 capsules by mouth daily.  03/20/15   [provider]  clonazePAM (KLONOPIN) 0.5 MG tablet Take 0.25 mg by mouth daily.     [provider]  hydrocortisone (ANUSOL-HC) 2.5 % rectal cream Apply topically.     [provider]  PARoxetine (PAXIL) 30 MG tablet Take 30 mg by mouth at bedtime.     [provider]  ranitidine (ZANTAC) 150 MG tablet Take 150 mg by mouth 2 (two) times daily.     [provider]  senna (SENOKOT) 8.6 MG TABS tablet Take 1 tablet by mouth.    [provider]  verapamil (CALAN-SR) 180 MG CR tablet Take 1 tablet by mouth 2 (two) times daily.    [provider]    Allergies Other  Family History  Problem Relation Age of Onset  . Heart attack Mother   . Cancer Mother     Social History Social History  Substance Use Topics  . Smoking status: Never Smoker  . Smokeless tobacco: Never Used  . Alcohol use No    Review of Systems Unable to obtain secondary to intellectual disability  ____________________________________________   PHYSICAL EXAM:  VITAL SIGNS: ED Triage Vitals [01/09/17 1010]  Enc Vitals Group     BP 105/81     Pulse Rate 60     Resp 18     Temp 98.4 F (36.9 C)     Temp Source Oral     SpO2 96 %     Weight 200 lb (90.7 kg)     Height 5\' 3"  (1.6 m)   Constitutional: Awake and alert. No acute distress.  Eyes: Left eye with injection and some discharge. Right eye with mild injection. ENT   Head: Normocephalic and atraumatic.   Nose: No congestion/rhinnorhea.   Mouth/Throat:  Mucous membranes are moist.   Neck: No stridor. Hematological/Lymphatic/Immunilogical: No cervical lymphadenopathy. Cardiovascular: Normal rate, regular rhythm.  No murmurs, rubs, or gallops. Respiratory: Normal respiratory effort without tachypnea nor retractions. Breath sounds are clear and equal bilaterally. No wheezes/rales/rhonchi. Gastrointestinal: Soft and non tender. No rebound. No guarding.  Genitourinary: Deferred Musculoskeletal: Normal range of motion in all extremities. No lower extremity edema. Neurologic:  Awake and alert. [place] of disability.  No gross focal neurologic deficits are appreciated.   Skin:  Skin is warm, dry and intact. No rash noted. Psychiatric: Mood and affect are normal. Speech and behavior are normal. Patient exhibits appropriate insight and judgment.  ____________________________________________    LABS (pertinent positives/negatives)  Labs Reviewed  CBC - Abnormal; Notable for the following:       Result Value   HCT 33.7 (*)    MCHC 36.2 (*)    RDW 14.9 (*)    All other components within normal limits  URINALYSIS, COMPLETE (UACMP) WITH MICROSCOPIC - Abnormal; Notable for the following:    Color, Urine YELLOW (*)    APPearance CLEAR (*)    All other components within normal limits  COMPREHENSIVE METABOLIC PANEL - Abnormal; Notable for the following:    Glucose, Bld 122 (*)    All other components within normal limits  TROPONIN I     ____________________________________________   EKG  I, Phineas Semen, attending physician, personally viewed and interpreted this EKG  EKG Time: 1024 Rate: 60 Rhythm: sinus rhythm with 1st degree av block Axis: normal Intervals: qtc 426, 1st degree av block QRS: narrow ST changes: no st elevation Impression: sinus rhythm with 1st degree av block, otherwise normal   ____________________________________________    RADIOLOGY  CT head IMPRESSION:  1. Atrophy and small vessel disease.  2. No evidence for acute intracranial abnormality.     ___________________________________________   PROCEDURES  Procedures  ____________________________________________   INITIAL IMPRESSION / ASSESSMENT AND PLAN / ED COURSE  Pertinent labs & imaging results that were available during my care of the patient were reviewed by me and considered in my medical decision making (see chart for details).  Patient presented to the emergency department today because of concerns for dizziness and instability with walking. On exam patient in no acute distress. We did try to ambulate the Patient and she is able to ambulate  with only minimal help from the nursing staff. She appeared slightly unsteady. Blood work and urinalysis here under concerning. We'll get MRI to evaluate for posterior cervical relation stroke. ----------------------------------------- 7:15 PM on 01/09/2017 -----------------------------------------  Talked to the patient's brother and power of attorney. Unfortunately the patient's medical history is not well known and thus MRI would not be able to proceed without further testing. At this point the patient's brother felt comfortable treating the patient for peripheral cause of dizziness. We discussed that we would get a CT scan to evaluate for large bleed or mass however it would likely miss a small posterior stroke. Patient's brother felt comfortable with this.   CT head without any acute findings. We'll try outpatient on meclizine. In addition we'll give patient erythromycin ointment for left eye conjunctivitis. ____________________________________________   FINAL CLINICAL IMPRESSION(S) / ED DIAGNOSES  Final diagnoses:  Dizziness  Conjunctivitis, unspecified conjunctivitis type, unspecified laterality     Note: This dictation was prepared with Dragon dictation. Any transcriptional errors that result from this process are unintentional     Phineas Semen, MD 01/09/17 2028

## 2017-01-09 NOTE — ED Notes (Signed)
Assisted with in and out cath. 

## 2017-01-09 NOTE — ED Notes (Signed)
Patient asked to try to ambulate and patient refused.

## 2017-01-09 NOTE — ED Notes (Signed)
Patient transported to MRI 

## 2017-01-09 NOTE — Telephone Encounter (Signed)
Lmov for patient to call back seen in ED for CP on 01/05/17 °Will try again at a later time  °

## 2017-01-10 NOTE — Telephone Encounter (Signed)
Lmov for patient to call back seen in ED for CP on 01/05/17 Will try again at a later time

## 2017-01-16 NOTE — Telephone Encounter (Signed)
Call number on file number was not correct Will send letter to patient

## 2017-01-17 NOTE — Telephone Encounter (Signed)
Unable to contact  Sent letter to patient to contact us

## 2017-02-14 ENCOUNTER — Telehealth: Payer: Self-pay | Admitting: *Deleted

## 2017-02-14 ENCOUNTER — Telehealth: Payer: Self-pay | Admitting: Nurse Practitioner

## 2017-02-14 ENCOUNTER — Encounter: Payer: Self-pay | Admitting: Nurse Practitioner

## 2017-02-14 ENCOUNTER — Ambulatory Visit (INDEPENDENT_AMBULATORY_CARE_PROVIDER_SITE_OTHER): Payer: Medicare Other | Admitting: Nurse Practitioner

## 2017-02-14 VITALS — BP 128/54 | HR 53 | Ht 66.0 in | Wt 181.8 lb

## 2017-02-14 DIAGNOSIS — I10 Essential (primary) hypertension: Secondary | ICD-10-CM | POA: Diagnosis not present

## 2017-02-14 DIAGNOSIS — R0609 Other forms of dyspnea: Secondary | ICD-10-CM

## 2017-02-14 DIAGNOSIS — I351 Nonrheumatic aortic (valve) insufficiency: Secondary | ICD-10-CM

## 2017-02-14 DIAGNOSIS — R0789 Other chest pain: Secondary | ICD-10-CM

## 2017-02-14 NOTE — Telephone Encounter (Signed)
Patient lives in group home. Notified patient's brother, ok per DPR, that patient scheduled for Lexiscan Myoview on 02/17/17 at 0800am. He verbalized understanding of procedure and pre-procedural instructions. He will contact group home to make sure transportation is arranged.

## 2017-02-14 NOTE — Patient Instructions (Addendum)
Medication Instructions:  Your physician recommends that you continue on your current medications as directed. Please refer to the Current Medication list given to you today.   Labwork: none  Testing/Procedures: Your physician has requested that you have an echocardiogram. Echocardiography is a painless test that uses sound waves to create images of your heart. It provides your doctor with information about the size and shape of your heart and how well your heart's chambers and valves are working. This procedure takes approximately one hour. There are no restrictions for this procedure.   Your physician has requested that you have a lexiscan myoview. For further information please visit [email or web address]. Please follow instruction sheet, as given.  [facility] LEXISCAN MYOVIEW  Your caregiver has ordered a Stress Test with nuclear imaging. The purpose of this test is to evaluate the blood supply to your heart muscle. This procedure is referred to as a "Non-Invasive Stress Test." This is because other than having an IV started in your vein, nothing is inserted or "invades" your body. Cardiac stress tests are done to find areas of poor blood flow to the heart by determining the extent of coronary artery disease (CAD). Some patients exercise on a treadmill, which naturally increases the blood flow to your heart, while others who are  unable to walk on a treadmill due to physical limitations have a pharmacologic/chemical stress agent called Lexiscan . This medicine will mimic walking on a treadmill by temporarily increasing your coronary blood flow.   Please note: these test may take anywhere between 2-4 hours to complete  [facility]  THE VOLUNTEERS AT THE FIRST DESK WILL DIRECT YOU WHERE TO GO  Date of Procedure:_____06/22/18_____________  Arrival Time for Procedure:_____07:45 am_______  Instructions regarding medication:   You may take your usual morning  medications with a small sip of water.    PLEASE NOTIFY THE OFFICE AT LEAST 24 HOURS IN ADVANCE IF YOU ARE UNABLE TO KEEP YOUR APPOINTMENT.  [phone] AND  PLEASE NOTIFY NUCLEAR MEDICINE AT [facility] AT LEAST 24 HOURS IN ADVANCE IF YOU ARE UNABLE TO KEEP YOUR APPOINTMENT. [phone]  How to prepare for your Myoview test:  1. Do not eat or drink after midnight 2. No caffeine for 24 hours prior to test 3. No smoking 24 hours prior to test. 4. Your medication may be taken with water.  If your doctor stopped a medication because of this test, do not take that medication. 5. Ladies, please do not wear dresses.  Skirts or pants are appropriate. Please wear a short sleeve shirt. 6. No perfume, cologne or lotion. 7. Wear comfortable walking shoes. No heels!    Follow-Up: Your physician recommends that you schedule a follow-up appointment in: 1 MONTH WITH CHRIS BERGE.    If you need a refill on your cardiac medications before your next appointment, please call your pharmacy.   Cardiac Nuclear Scan A cardiac nuclear scan is a test that measures blood flow to the heart when a person is resting and when he or she is exercising. The test looks for problems such as:  Not enough blood reaching a portion of the heart.  The heart muscle not working normally.  You may need this test if:  You have heart disease.  You have had abnormal lab results.  You have had heart surgery or angioplasty.  You have chest pain.  You have shortness of breath.  In this test, a radioactive dye (tracer) is injected into your bloodstream. After  the tracer has traveled to your heart, an imaging device is used to measure how much of the tracer is absorbed by or distributed to various areas of your heart. This procedure is usually done at a hospital and takes 2-4 hours. Tell a health care provider about:  Any allergies you have.  All medicines you are taking, including vitamins, herbs, eye drops, creams,  and over-the-counter medicines.  Any problems you or family members have had with the use of anesthetic medicines.  Any blood disorders you have.  Any surgeries you have had.  Any medical conditions you have.  Whether you are pregnant or may be pregnant. What are the risks? Generally, this is a safe procedure. However, problems may occur, including:  Serious chest pain and heart attack. This is only a risk if the stress portion of the test is done.  Rapid heartbeat.  Sensation of warmth in your chest. This usually passes quickly.  What happens before the procedure?  Ask your health care provider about changing or stopping your regular medicines. This is especially important if you are taking diabetes medicines or blood thinners.  Remove your jewelry on the day of the procedure. What happens during the procedure?  An IV tube will be inserted into one of your veins.  Your health care provider will inject a small amount of radioactive tracer through the tube.  You will wait for 20-40 minutes while the tracer travels through your bloodstream.  Your heart activity will be monitored with an electrocardiogram (ECG).  You will lie down on an exam table.  Images of your heart will be taken for about 15-20 minutes.  You may be asked to exercise on a treadmill or stationary bike. While you exercise, your heart's activity will be monitored with an ECG, and your blood pressure will be checked. If you are unable to exercise, you may be given a medicine to increase blood flow to parts of your heart.  When blood flow to your heart has peaked, a tracer will again be injected through the IV tube.  After 20-40 minutes, you will get back on the exam table and have more images taken of your heart.  When the procedure is over, your IV tube will be removed. The procedure may vary among health care providers and hospitals. Depending on the type of tracer used, scans may need to be repeated 3-4  hours later. What happens after the procedure?  Unless your health care provider tells you otherwise, you may return to your normal schedule, including diet, activities, and medicines.  Unless your health care provider tells you otherwise, you may increase your fluid intake. This will help flush the contrast dye from your body. Drink enough fluid to keep your urine clear or pale yellow.  It is up to you to get your test results. Ask your health care provider, or the department that is doing the test, when your results will be ready. Summary  A cardiac nuclear scan measures the blood flow to the heart when a person is resting and when he or she is exercising.  You may need this test if you are at risk for heart disease.  Tell your health care provider if you are pregnant.  Unless your health care provider tells you otherwise, increase your fluid intake. This will help flush the contrast dye from your body. Drink enough fluid to keep your urine clear or pale yellow. This information is not intended to replace advice given to you by  your health care provider. Make sure you discuss any questions you have with your health care provider. Document Released: 09/09/2004 Document Revised: 08/17/2016 Document Reviewed: 07/24/2013 Elsevier Interactive Patient Education  2017 [employer]   Echocardiogram An echocardiogram, or echocardiography, uses sound waves (ultrasound) to produce an image of your heart. The echocardiogram is simple, painless, obtained within a short period of time, and offers valuable information to your health care provider. The images from an echocardiogram can provide information such as:  Evidence of coronary artery disease (CAD).  Heart size.  Heart muscle function.  Heart valve function.  Aneurysm detection.  Evidence of a past heart attack.  Fluid buildup around the heart.  Heart muscle thickening.  Assess heart valve function.  Tell a health care provider  about:  Any allergies you have.  All medicines you are taking, including vitamins, herbs, eye drops, creams, and over-the-counter medicines.  Any problems you or family members have had with anesthetic medicines.  Any blood disorders you have.  Any surgeries you have had.  Any medical conditions you have.  Whether you are pregnant or may be pregnant. What happens before the procedure? No special preparation is needed. Eat and drink normally. What happens during the procedure?  In order to produce an image of your heart, gel will be applied to your chest and a wand-like tool (transducer) will be moved over your chest. The gel will help transmit the sound waves from the transducer. The sound waves will harmlessly bounce off your heart to allow the heart images to be captured in real-time motion. These images will then be recorded.  You may need an IV to receive a medicine that improves the quality of the pictures. What happens after the procedure? You may return to your normal schedule including diet, activities, and medicines, unless your health care provider tells you otherwise. This information is not intended to replace advice given to you by your health care provider. Make sure you discuss any questions you have with your health care provider. Document Released: 08/12/2000 Document Revised: 04/02/2016 Document Reviewed: 04/22/2013 Elsevier Interactive Patient Education  2017 [employer].

## 2017-02-14 NOTE — Progress Notes (Signed)
Cardiology Clinic Note   Patient Name: Melody Stone Date of Encounter: 02/14/2017  Primary Care Provider:  Danella PentonMiller, Mark Stone, Stone Primary Cardiologist:  Melody Stone   Patient Profile    66 year old female with a prior history of hypertension, hyperlipidemia, mental retardation, GERD, chest pain, dyspnea on exertion, and mild to moderate aortic insufficiency, who presents for evaluation related to recurrent chest pain and ongoing dyspnea.  Past Medical History    Past Medical History:  Diagnosis Date  . Anemia   . Aortic insufficiency    a. 10/2015 Echo: Nl LV fxn, mild to moderate AI.  Marland Kitchen. Chest pain    a. 10/2015 Ex MV Melody Stone(Melody Stone): EF 69%, no ischemia.  Marland Kitchen. Dyspnea on exertion   . Hepatitis C   . HLD (hyperlipidemia)   . Hypertension   . Mental retardation   . Reflux esophagitis    Past Surgical History:  Procedure Laterality Date  . ABDOMINAL HYSTERECTOMY    . APPENDECTOMY      Allergies  Allergies  Allergen Reactions  . Other Other (See Comments)    Psychotropic drugs cause kidney failure. Psychotropic drug cause renal failure. Psychotropic drugs cause kidney failure.    History of Present Illness    66 year old female with the above past medical history including mental retardation since birth, hypertension, hyperlipidemia, hepatitis C, and GERD. Patient stays at a group home locally and does participate in activities there. Her caregiver is with her today. The patient is unable to provide any significant history. I also spoke with her brother, who is her power of attorney, over the phone today. Per the patient's caregiver, Melody Stone has always previously been active. A year ago, she would walk regularly without any significant limitations. In February 2014, she had an episode of chest pain and also dyspnea. She was evaluated with stress testing, which was low risk. Echo showed normal LV function with mild to moderate aortic insufficiency. Over the past year, she has had  progression of dyspnea on exertion and per her caregiver, she currently now walk short distances prior to being quite winded. She has not been experiencing, or at least not reporting PND, orthopnea, edema, or early satiety. In early May, she developed chest pain during an activity at the group home and was taken to the [facility] ED. There, ECG was nonacute and labs were within normal limits. She was discharged home with recommendation for cardiology follow-up. 4 days later, due to unsteadiness on her feet, she was taken back to the emergency department. After discussion with her brother, a CT scan was performed and did not show any acute issues. She was discharged and prescribed meclizine.  Since then, unsteadiness has improved some. Her caregiver continues to note dyspnea on exertion. She has not been complaining of any chest pain.  Home Medications    Prior to Admission medications   Medication Sig Start Date End Date Taking? Authorizing Provider  Cholecalciferol (VITAMIN D3) 1000 units CAPS Take 2 capsules by mouth daily.  03/20/15  Yes Melody Stone  clonazePAM (KLONOPIN) 0.5 MG tablet Take 0.25-0.5 mg by mouth 2 (two) times daily. Take 0.25 mg (1/2 tablet) by mouth in the morning and take 0.5 mg (1 tablet) by mouth in the evening.   Yes Melody Stone  ibuprofen (ADVIL,MOTRIN) 200 MG tablet Take 500 mg by mouth every 6 (six) hours as needed.   Yes Melody Stone  meclizine (ANTIVERT) 25 MG tablet Take 1 tablet (25 mg total) by mouth  3 (three) times daily as needed for dizziness. 01/09/17  Yes Melody Semen, Stone  PARoxetine (PAXIL) 30 MG tablet Take 30 mg by mouth at bedtime.    Yes Melody Stone  ranitidine (ZANTAC) 150 MG tablet Take 150 mg by mouth 2 (two) times daily.    Yes Melody Stone  verapamil (CALAN-SR) 180 MG CR tablet Take 1 tablet by mouth 2 (two) times daily.   Yes Melody Stone    Family History    Family History    Problem Relation Age of Onset  . Heart attack Mother        CABG in her 31's  . Cancer Mother   . CAD Brother        51  . Hypertension Brother        67    Social History    Social History   Social History  . Marital status: Single    Spouse name: N/A  . Number of children: N/A  . Years of education: N/A   Occupational History  . Not on file.   Social History Main Topics  . Smoking status: Never Smoker  . Smokeless tobacco: Never Used  . Alcohol use No  . Drug use: No  . Sexual activity: Not on file   Other Topics Concern  . Not on file   Social History Narrative   Patient lives in a group home.  She participates in activities regularly.  Her exercise tolerance has fallen off over the past year.  She is no longer routinely exercising/going for walks.     Review of Systems    General:  No chills, fever, night sweats or weight changes.  Cardiovascular:  +++ one episode of chest pain last month, +++ dyspnea on exertion, no edema, orthopnea, palpitations, paroxysmal nocturnal dyspnea. Dermatological: No rash, lesions/masses Respiratory: No cough, +++ dyspnea Urologic: No hematuria, dysuria Abdominal:   No nausea, vomiting, diarrhea, bright red blood per rectum, melena, or hematemesis Neurologic:  No visual changes, wkns, changes in mental status. All other systems reviewed and are otherwise negative except as noted above.  Physical Exam    VS:  BP (!) 128/54 (BP Location: Left Arm, Patient Position: Sitting, Cuff Size: Normal)   Pulse (!) 53   Ht 5\' 6"  (1.676 m)   Wt 181 lb 12 oz (82.4 kg)   BMI 29.34 kg/m  , BMI Body mass index is 29.34 kg/m. GEN: Well nourished, well developed, in no acute distress.  HEENT: normal.  Neck: Supple, no JVD, carotid bruits, or masses. Cardiac: RRR, 2/6 syst murmur. I do not appreciate a diastolic murmur.  No rubs, or gallops. No clubbing, cyanosis, edema.  Radials/DP/PT 2+ and equal bilaterally.  Respiratory:  Respirations  regular and unlabored, clear to auscultation bilaterally. GI: Soft, nontender, nondistended, BS + x 4. MS: no deformity or atrophy. Skin: warm and dry, no rash. Neuro:  Strength and sensation are intact. Pt with limited yes/no responses.  Unable to ascertain orientation. Psych: Normal affect.  Accessory Clinical Findings    ECG - Sinus bradycardia, 53, no acute ST or T changes.  Assessment & Plan   1.  Midsternal chest pain: Patient had an episode of chest discomfort prompting ER evaluation on May 10. EKG and troponins were normal at the time. She has not had any recurrence of chest discomfort. She has had ongoing dyspnea on exertion which has worsened over the past 6+ months. She had previous evaluation with stress testing and  echocardiogram in the winter/spring of 2017. Stress testing at that time was normal. I have discussed her case with her brother over the phone today. He is the power of attorney. Given progression of symptoms and risk factors for coronary disease including hypertension and family history, I will pursue a Lexiscan Myoview to rule out ischemia.  2. Dyspnea on exertion: Patient has had progressive dyspnea on exertion over the past 6+ months. Echocardiogram in March 2017 showed normal LV function with mild to moderate aortic insufficiency. I will plan on follow-up echocardiogram. Her brother is in agreement with this plan.  3. Essential hypertension: Blood pressure is stable at 120/54. She remains on verapamil therapy which she has been on for many years per her caregiver.  4. Mild to moderate aortic insufficiency: Question contribution to symptoms. Follow-up echocardiogram as above.  5. Disposition: Follow-up stress testing and echocardiography as outlined above. Follow up in clinic in one month or sooner if necessary.  Nicolasa Ducking, NP 02/14/2017, 11:30 AM

## 2017-02-14 NOTE — Telephone Encounter (Signed)
lmov to notify facility patient medicaid is ineligible.  Unable to verify via epic nor passport .

## 2017-02-17 ENCOUNTER — Encounter
Admission: RE | Admit: 2017-02-17 | Discharge: 2017-02-17 | Disposition: A | Discharge status: Home / Self Care | Payer: Medicare Other | Source: Ambulatory Visit | Attending: Nurse Practitioner | Admitting: Nurse Practitioner

## 2017-02-17 DIAGNOSIS — R0609 Other forms of dyspnea: Secondary | ICD-10-CM | POA: Diagnosis not present

## 2017-02-17 LAB — NM MYOCAR MULTI W/SPECT W/WALL MOTION / EF
CHL CUP NUCLEAR SSS: 5
CHL CUP RESTING HR STRESS: 70 {beats}/min
CSEPED: 0 min
CSEPEDS: 0 s
CSEPEW: 1 METS
CSEPHR: 48 %
CSEPPHR: 73 {beats}/min
LV dias vol: 74 mL (ref 46–106)
LV sys vol: 17 mL
MPHR: 152 {beats}/min
NUC STRESS TID: 1.33
SDS: 0
SRS: 5

## 2017-02-17 MED ORDER — TECHNETIUM TC 99M TETROFOSMIN IV KIT
12.0000 | PACK | Freq: Once | INTRAVENOUS | Status: AC | PRN
  Administered 2017-02-17: 12 via INTRAVENOUS

## 2017-02-17 MED ORDER — REGADENOSON 0.4 MG/5ML IV SOLN
0.4000 mg | Freq: Once | INTRAVENOUS | Status: AC
  Administered 2017-02-17: 0.4 mg via INTRAVENOUS

## 2017-02-17 MED ORDER — TECHNETIUM TC 99M TETROFOSMIN IV KIT
31.4100 | PACK | Freq: Once | INTRAVENOUS | Status: AC | PRN
  Administered 2017-02-17: 31.41 via INTRAVENOUS

## 2017-03-06 ENCOUNTER — Other Ambulatory Visit: Payer: Self-pay

## 2017-03-06 ENCOUNTER — Ambulatory Visit (INDEPENDENT_AMBULATORY_CARE_PROVIDER_SITE_OTHER): Payer: Medicare Other

## 2017-03-06 DIAGNOSIS — R0609 Other forms of dyspnea: Secondary | ICD-10-CM

## 2017-03-22 ENCOUNTER — Ambulatory Visit (INDEPENDENT_AMBULATORY_CARE_PROVIDER_SITE_OTHER): Payer: Medicare Other | Admitting: Nurse Practitioner

## 2017-03-22 ENCOUNTER — Encounter: Payer: Self-pay | Admitting: Nurse Practitioner

## 2017-03-22 VITALS — BP 143/68 | HR 61 | Ht 65.0 in | Wt 183.0 lb

## 2017-03-22 DIAGNOSIS — R072 Precordial pain: Secondary | ICD-10-CM

## 2017-03-22 DIAGNOSIS — I10 Essential (primary) hypertension: Secondary | ICD-10-CM

## 2017-03-22 NOTE — Patient Instructions (Signed)
Medication Instructions:  Please continue your current medications  Labwork: None  Testing/Procedures: None  Follow-Up: Call or return to clinic prn if these symptoms worsen or fail to improve as anticipated.  If you need a refill on your cardiac medications before your next appointment, please call your pharmacy.   

## 2017-03-22 NOTE — Progress Notes (Signed)
Office Visit    Patient Name: Melody RidgesSandra Kay Rogness Date of Encounter: 03/22/2017  Primary Care Provider:  Danella PentonMiller, Mark F, MD Primary Cardiologist:  unassigned  Chief Complaint    66 year old female with a history of hypertension, hyperlipidemia, mental retardation, GERD, chest pain, dyspnea on exertion, mild to moderate aortic insufficiency, who was recently evaluated for chest pain and presents for follow-up.  Past Medical History    Past Medical History:  Diagnosis Date  . Anemia   . Aortic insufficiency    a. 10/2015 Echo: Nl LV fxn, mild to moderate AI; b. 02/2017 Echo: EF 60-65%, no rwma, mild AI.  Marland Kitchen. Chest pain    a. 10/2015 Ex MV Gavin Potters([facility]): EF 69%, no ischemia; b. 01/2017 Lexiscan MV: EF 45%, no ischemia/infarct-->Low risk.  Marland Kitchen. Dyspnea on exertion   . Hepatitis C   . HLD (hyperlipidemia)   . Hypertension   . Mental retardation   . Reflux esophagitis    Past Surgical History:  Procedure Laterality Date  . ABDOMINAL HYSTERECTOMY    . APPENDECTOMY      Allergies  Allergies  Allergen Reactions  . Other Other (See Comments)    Psychotropic drugs cause kidney failure. Psychotropic drug cause renal failure. Psychotropic drugs cause kidney failure.    History of Present Illness    66 year old female with the above past medical history including mental retardation since birth, hypertension, hyperlipidemia, hepatitis C, and GERD. I recently saw her in June secondary to complaints of intermittent chest discomfort and dyspnea.  She was seen in the emergency department in early May for similar symptoms and evaluation was unrevealing. After discussion with her brother, who is her power of attorney, I arranged for stress testing. This took place in June and showed no evidence of ischemia or infarct and overall was a low risk study. That said, EF was mildly depressed. An echocardiogram was performed and this showed normal LV function.  Since her last visit, she has felt well. A  caregiver from the group home is present with her today and notes that she has not been complaining of chest pain or dyspnea. She has done all of her usual activities without limitations. The patient says she has been feeling well and has "no issues."  Home Medications    Prior to Admission medications   Medication Sig Start Date End Date Taking? Authorizing Provider  Cholecalciferol (VITAMIN D3) 1000 units CAPS Take 2 capsules by mouth daily.  03/20/15  Yes [provider]  clonazePAM (KLONOPIN) 0.5 MG tablet Take 0.25-0.5 mg by mouth 2 (two) times daily. Take 0.25 mg (1/2 tablet) by mouth in the morning and take 0.5 mg (1 tablet) by mouth in the evening.   Yes [provider]  meclizine (ANTIVERT) 25 MG tablet Take 1 tablet (25 mg total) by mouth 3 (three) times daily as needed for dizziness. 01/09/17  Yes Phineas SemenGoodman, Graydon, MD  PARoxetine (PAXIL) 30 MG tablet Take 30 mg by mouth at bedtime.    Yes [provider]  senna (SENOKOT) 8.6 MG tablet Take 2 tablets by mouth daily.   Yes [provider]  verapamil (CALAN-SR) 180 MG CR tablet Take 1 tablet by mouth 2 (two) times daily.   Yes [provider]    Review of Systems    As above, she has been feeling well without chest pain or dyspnea. Patient says she has "no issues.".  All other systems reviewed and are otherwise negative except as noted above.  Physical Exam    VS:  BP (!) 143/68 (BP Location: Right Arm, Patient Position: Sitting, Cuff Size: Large)   Pulse 61   Ht 5\' 5"  (1.651 m)   Wt 183 lb (83 kg)   BMI 30.45 kg/m  , BMI Body mass index is 30.45 kg/m.Repeat blood pressure 130/58.  GEN: Well nourished, well developed, in no acute distress.  HEENT: normal.  Neck: Supple, no JVD, carotid bruits, or masses. Cardiac: RRR 2/6 systolic murmur at the upper sternal border. No rubs, or gallops. No clubbing, cyanosis, edema.  Radials/DP/PT 2+ and equal bilaterally.  Respiratory:  Respirations  regular and unlabored, clear to auscultation bilaterally. GI: Soft, nontender, nondistended, BS + x 4. MS: no deformity or atrophy. Skin: warm and dry, no rash. Neuro:  Strength and sensation are intact. unable to ascertain orientation.  Psych: Normal affect.  Accessory Clinical Findings    Lexiscan Myoview in June showed no evidence of ischemia or infarct with an EF of 45%. This was followed by echocardiogram in July which showed normal LV function. Both tests are outlined above in the past medical history.  Assessment & Plan    1.  Midsternal chest pain: Patient was seen in June secondary to complaints of chest pain, prompting an ER visit. Stress testing was undertaken and showed no evidence of ischemia. EF was artifactually low and echocardiogram confirmed that she does have normal LV function. Her caregiver says she has not been complaining of any chest pain recently and patient says that she has "no issues." In that setting, no further workup is required.  2. Essential hypertension: Blood pressure was initially elevated at 143/68 but on repeat, she was at 130/58. Caregiver tells me that pressures are typically in the 120s when checked once weekly at the group home. Continue current dose of verapamil.  3. Mild aortic insufficiency: Echocardiogram in July showed only mild AI.  4. Disposition: Follow-up 1 year.  Nicolasa Duckinghristopher Odile Veloso, NP 03/22/2017, 12:35 PM

## 2017-08-14 ENCOUNTER — Other Ambulatory Visit: Payer: Self-pay

## 2017-08-14 DIAGNOSIS — R911 Solitary pulmonary nodule: Secondary | ICD-10-CM

## 2017-08-25 ENCOUNTER — Telehealth: Payer: Self-pay

## 2017-08-25 NOTE — Telephone Encounter (Signed)
Patient's appointments for CT Scan and follow up with Dr. Thelma Bargeaks were mailed to her facility.

## 2017-09-13 ENCOUNTER — Ambulatory Visit: Payer: Medicare Other | Attending: Cardiothoracic Surgery

## 2017-09-18 ENCOUNTER — Other Ambulatory Visit: Payer: Self-pay

## 2017-09-18 ENCOUNTER — Telehealth: Payer: Self-pay

## 2017-09-18 NOTE — Telephone Encounter (Signed)
Called patient's sister Okey DupreRose at [phone] and was not able to leave a message. Patient had an appointment to have her CT Scan done on 09/13/2017 and she did not show up. I will mail her another letter to remind her that she needs to contact central scheduling to reschedule her CT Scan and then to call us back to reschedule Dr. Thelma Bargeaks appointment.

## 2017-09-22 ENCOUNTER — Ambulatory Visit: Payer: Self-pay | Admitting: Cardiothoracic Surgery

## 2018-07-12 ENCOUNTER — Ambulatory Visit (INDEPENDENT_AMBULATORY_CARE_PROVIDER_SITE_OTHER): Payer: Medicare Other | Admitting: Podiatry

## 2018-07-12 ENCOUNTER — Encounter: Payer: Self-pay | Admitting: Podiatry

## 2018-07-12 DIAGNOSIS — B351 Tinea unguium: Secondary | ICD-10-CM

## 2018-07-12 DIAGNOSIS — M79675 Pain in left toe(s): Secondary | ICD-10-CM | POA: Diagnosis not present

## 2018-07-12 DIAGNOSIS — M79674 Pain in right toe(s): Secondary | ICD-10-CM | POA: Diagnosis not present

## 2018-07-12 NOTE — Progress Notes (Addendum)
Complaint:  Visit Type: Patient returns to my office for continued preventative foot care services. Complaint: Patient states" my nails have grown long and thick and become painful to walk and wear shoes" The patient presents for preventative foot care services. Patient presents to the office with representative from home.  Podiatric Exam: Vascular: dorsalis pedis and posterior tibial pulses are palpable bilateral. Capillary return is immediate. Temperature gradient is WNL. Skin turgor WNL  Sensorium: Normal Semmes Weinstein monofilament test. Normal tactile sensation bilaterally. Nail Exam: Pt has thick disfigured discolored nails with subungual debris noted bilateral entire nail second through fifth toenails Ulcer Exam: There is no evidence of ulcer or pre-ulcerative changes or infection. Orthopedic Exam: Muscle tone and strength are WNL. No limitations in general ROM. No crepitus or effusions noted. Foot type and digits show no abnormalities. Bony prominences are unremarkable. Skin: No Porokeratosis. No infection or ulcers  Diagnosis:  Onychomycosis, , Pain in right toe, pain in left toes  Treatment & Plan Procedures and Treatment: Consent by patient was obtained for treatment procedures.   Debridement of mycotic and hypertrophic toenails, 1 through 5 bilateral and clearing of subungual debris. No ulceration, no infection noted. ABN signed for 2019. Return Visit-Office Procedure: Patient instructed to return to the office for a follow up visit 4 months for continued evaluation and treatment.    Helane GuntherGregory Maisy Newport DPM

## 2018-08-14 DIAGNOSIS — I10 Essential (primary) hypertension: Secondary | ICD-10-CM | POA: Insufficient documentation

## 2018-09-29 ENCOUNTER — Encounter: Payer: Self-pay | Admitting: Emergency Medicine

## 2018-09-29 ENCOUNTER — Emergency Department: Payer: Medicare Other

## 2018-09-29 ENCOUNTER — Emergency Department
Admission: EM | Admit: 2018-09-29 | Discharge: 2018-09-29 | Disposition: A | Discharge status: Home / Self Care | Payer: Medicare Other | Attending: Student in an Organized Health Care Education/Training Program | Admitting: Student in an Organized Health Care Education/Training Program

## 2018-09-29 ENCOUNTER — Other Ambulatory Visit: Payer: Self-pay

## 2018-09-29 DIAGNOSIS — Z79899 Other long term (current) drug therapy: Secondary | ICD-10-CM | POA: Diagnosis not present

## 2018-09-29 DIAGNOSIS — W0110XA Fall on same level from slipping, tripping and stumbling with subsequent striking against unspecified object, initial encounter: Secondary | ICD-10-CM | POA: Diagnosis not present

## 2018-09-29 DIAGNOSIS — S0990XA Unspecified injury of head, initial encounter: Secondary | ICD-10-CM | POA: Insufficient documentation

## 2018-09-29 DIAGNOSIS — Y998 Other external cause status: Secondary | ICD-10-CM | POA: Insufficient documentation

## 2018-09-29 DIAGNOSIS — Y92121 Bathroom in nursing home as the place of occurrence of the external cause: Secondary | ICD-10-CM | POA: Insufficient documentation

## 2018-09-29 DIAGNOSIS — W19XXXA Unspecified fall, initial encounter: Secondary | ICD-10-CM

## 2018-09-29 DIAGNOSIS — F79 Unspecified intellectual disabilities: Secondary | ICD-10-CM | POA: Diagnosis not present

## 2018-09-29 DIAGNOSIS — Y9389 Activity, other specified: Secondary | ICD-10-CM | POA: Insufficient documentation

## 2018-09-29 DIAGNOSIS — I10 Essential (primary) hypertension: Secondary | ICD-10-CM | POA: Diagnosis not present

## 2018-09-29 DIAGNOSIS — I2510 Atherosclerotic heart disease of native coronary artery without angina pectoris: Secondary | ICD-10-CM | POA: Diagnosis not present

## 2018-09-29 NOTE — ED Provider Notes (Signed)
[facility] Emergency Department Provider Note    First MD Initiated Contact with Patient 09/29/18 0830     (approximate)  I have reviewed the triage vital signs and the nursing notes.   HISTORY  Chief Complaint Fall  Level V Caveat:  MR  HPI Melody Stone is a 68 y.o. female below listed past medical history presents the ER for evaluation after mechanical fall.  Patient unable to provide much additional history.  Denies any pain at this time.  There is no prolonged LOC.  Did reportedly hit the back of her head and was complaining of headache.  She denies any blood thinners.  Exam somewhat limited due to cooperation but patient pleasant moving all extremities and conversive with staff.  According to the group home staff that is with her right now she is currently acting at her baseline.   Past Medical History:  Diagnosis Date  . Anemia   . Aortic insufficiency    a. 10/2015 Echo: Nl LV fxn, mild to moderate AI; b. 02/2017 Echo: EF 60-65%, no rwma, mild AI.  Marland Kitchen Chest pain    a. 10/2015 Ex MV Gavin Potters): EF 69%, no ischemia; b. 01/2017 Lexiscan MV: EF 45%, no ischemia/infarct-->Low risk.  Marland Kitchen Dyspnea on exertion   . Hepatitis C   . HLD (hyperlipidemia)   . Hypertension   . Mental retardation   . Reflux esophagitis    Family History  Problem Relation Age of Onset  . Heart attack Mother        CABG in her 39's  . Cancer Mother   . CAD Brother        83  . Hypertension Brother        40   Past Surgical History:  Procedure Laterality Date  . ABDOMINAL HYSTERECTOMY    . APPENDECTOMY     Patient Active Problem List   Diagnosis Date Noted  . Dysphagia, oropharyngeal 07/08/2016  . Lung nodule, solitary 03/30/2016  . Moderate aortic insufficiency 03/30/2016  . HCV (hepatitis C virus) 02/19/2016  . HLD (hyperlipidemia) 02/19/2016  . BP (high blood pressure) 02/19/2016  . Intellectual disability 02/19/2016  . Esophagitis, reflux 02/19/2016  .  Breathlessness on exertion 10/27/2015      Prior to Admission medications   Medication Sig Start Date End Date Taking? Authorizing Provider  Cholecalciferol (VITAMIN D3) 1000 units CAPS Take 2,000 Units by mouth daily.    Yes [provider]  clonazePAM (KLONOPIN) 0.5 MG tablet Take 0.25-0.5 mg by mouth See admin instructions. Take  tablet (0.25mg ) by mouth every morning and 1 tablet (1mg ) by mouth every evening   Yes [provider]  ibuprofen (ADVIL,MOTRIN) 200 MG tablet Take 600 mg by mouth 2 (two) times daily.   Yes [provider]  PARoxetine (PAXIL) 30 MG tablet Take 30 mg by mouth at bedtime.    Yes [provider]  ranitidine (ZANTAC) 150 MG tablet Take 150 mg by mouth 2 (two) times daily.    Yes [provider]  senna (SENOKOT) 8.6 MG tablet Take 2 tablets by mouth daily.   Yes [provider]  verapamil (CALAN-SR) 180 MG CR tablet Take 180 mg by mouth 2 (two) times daily.    Yes [provider]  meclizine (ANTIVERT) 25 MG tablet Take 1 tablet (25 mg total) by mouth 3 (three) times daily as needed for dizziness. 01/09/17   Phineas Semen, MD    Allergies Other    Social  History Social History   Tobacco Use  . Smoking status: Never Smoker  . Smokeless tobacco: Never Used  Substance Use Topics  . Alcohol use: No  . Drug use: No    Review of Systems Patient denies headaches, rhinorrhea, blurry vision, numbness, shortness of breath, chest pain, edema, cough, abdominal pain, nausea, vomiting, diarrhea, dysuria, fevers, rashes or hallucinations unless otherwise stated above in HPI. ____________________________________________   PHYSICAL EXAM:  VITAL SIGNS: Vitals:   09/29/18 0821 09/29/18 0900  BP: (!) 105/51 135/76  Pulse: 74 (!) 59  Resp: 18 (!) 21  Temp: (!) 97.5 F (36.4 C)   SpO2: 100% 98%    Constitutional: Alert. Well appearing and in no acute distress. Eyes: Conjunctivae are normal.  Head:  Atraumatic. Nose: No congestion/rhinnorhea. Mouth/Throat: Mucous membranes are moist.   Neck: Painless ROM.  Cardiovascular:   Good peripheral circulation. Respiratory: Normal respiratory effort.  No retractions.  Gastrointestinal: Soft and nontender.  Musculoskeletal: No lower extremity tenderness .  No joint effusions. Neurologic: No gross focal neurologic deficits are appreciated.  Skin:  Skin is warm, dry and intact. No rash noted. Psychiatric: appropriate and at baseline per staff member ____________________________________________   LABS (all labs ordered are listed, but only abnormal results are displayed)  No results found for this or any previous visit (from the past 24 hour(s)). ____________________________________________  EKG My review and personal interpretation at Time: 8:27   Indication: fall  Rate: 60  Rhythm: sinus Axis: normal Other: normal intervals, no stemi ____________________________________________  RADIOLOGY  I personally reviewed all radiographic images ordered to evaluate for the above acute complaints and reviewed radiology reports and findings.  These findings were personally discussed with the patient.  Please see medical record for radiology report.  ____________________________________________   PROCEDURES  Procedure(s) performed:  Procedures    Critical Care performed: no ____________________________________________   INITIAL IMPRESSION / ASSESSMENT AND PLAN / ED COURSE  Pertinent labs & imaging results that were available during my care of the patient were reviewed by me and considered in my medical decision making (see chart for details).  DDX: contusion, concussion, dehydration, sah, sdh, iph  Melody Stone is a 68 y.o. who presents to the ED with minor head injury after fall as described above.  Patient otherwise asymptomatic.  Will reach out to patient's legal guardian regarding goals of care as she is otherwise asymptomatic  and hemodynamically stable.  Remainder of her exam is reassuring.  Clinical Course as of Sep 29 928  Sat Sep 29, 2018  5170 CT imaging fortunately shows no evidence of acute intracranial abnormality.  Discussed case with the patient's legal guardian, her brother Melody Stone "who agrees with plan as work-up thus far is reassuring does not want any invasive testing, needlesticks or any additional testing that would cause some discomfort to her she is otherwise well-appearing and asymptomatic.  At this point do believe she stable and appropriate for discharge back to facility.   [PR]    Clinical Course User Index [PR] Willy Eddy, MD     ____________________________________________   FINAL CLINICAL IMPRESSION(S) / ED DIAGNOSES  Final diagnoses:  Fall, initial encounter  Minor head injury, initial encounter      NEW MEDICATIONS STARTED DURING THIS VISIT:  New Prescriptions   No medications on file     Note:  This document was prepared using Dragon voice recognition software and may include unintentional dictation errors.     Willy Eddy, MD 09/29/18 0930

## 2018-09-29 NOTE — ED Triage Notes (Signed)
Pt here for unwitnessed fall.  Caregiver from [facility] thinks she got dizzy and fell in bathroom. Pt has MR and is unable to give accurate details of fall.  Did speak with legal guardian tommy.  Pt ambulatory to triage.

## 2018-09-29 NOTE — ED Notes (Signed)
Returned from CT.

## 2018-11-08 ENCOUNTER — Ambulatory Visit: Payer: Medicare Other | Admitting: Podiatry

## 2019-02-27 DIAGNOSIS — Z0000 Encounter for general adult medical examination without abnormal findings: Secondary | ICD-10-CM | POA: Insufficient documentation

## 2019-02-28 IMAGING — CT CT HEAD W/O CM
3 series · 16 of 47 positions shown, 19 images · non-contrast
Comparison: 12/28/2010

CLINICAL DATA: [facility] of [facility] arrives with caregiver.
States patient has complained of dizziness x 4 days.

EXAM:
CT HEAD WITHOUT CONTRAST
TECHNIQUE: Contiguous axial images were obtained from the base of the skull
through the vertex without intravenous contrast.

[Series 3: head wo · axial · 0.44mm/px · z∈[+24,+149]mm · 10 of 31 slices shown, 13 images]
[im 3/31  brain]
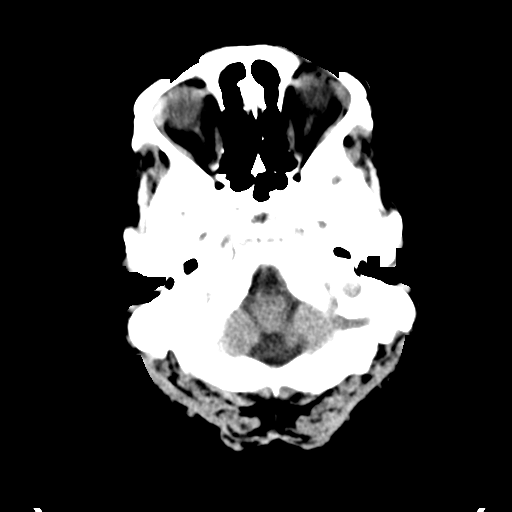
[im 3/31  bone]
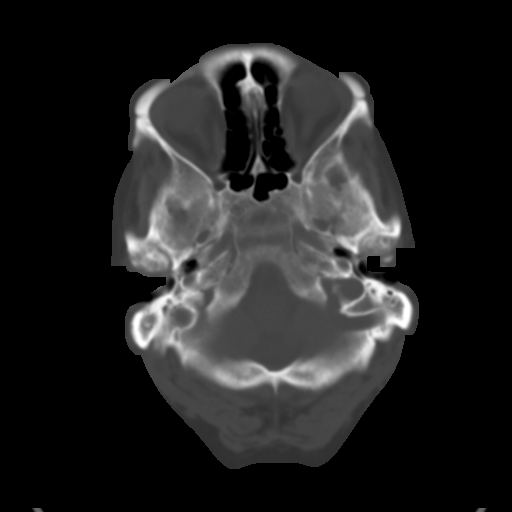
[im 6/31  brain]
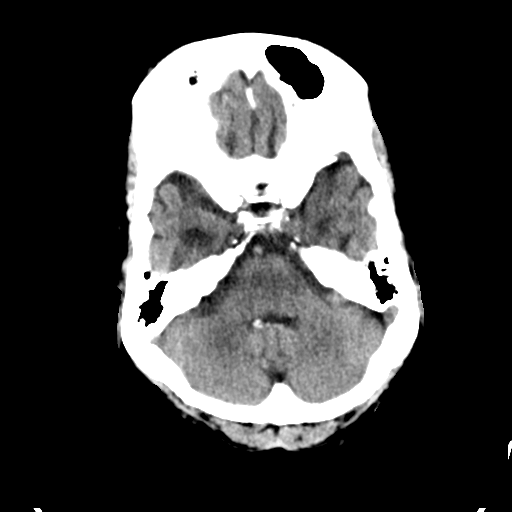
[im 9/31  brain]
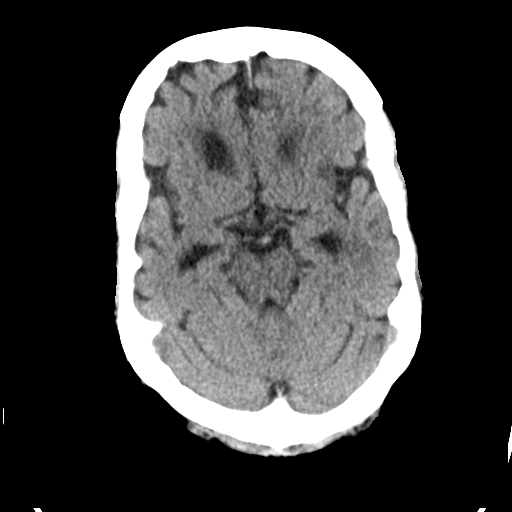
[im 11/31  brain]
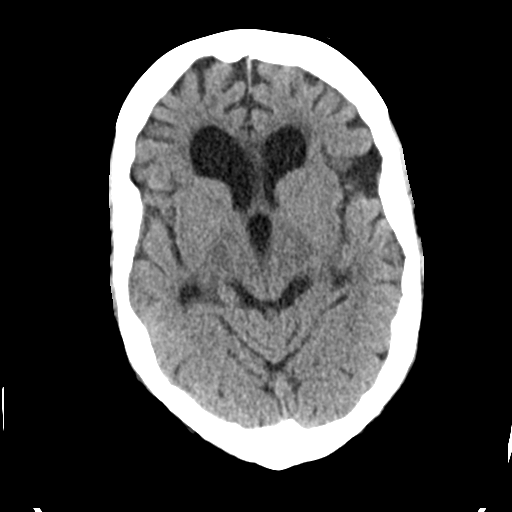
[im 14/31  brain]
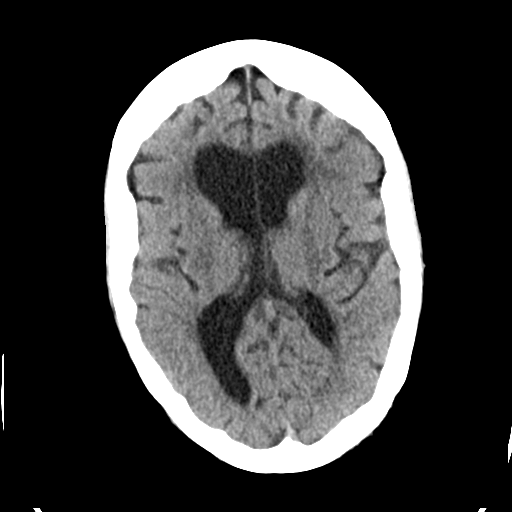
[im 14/31  bone]
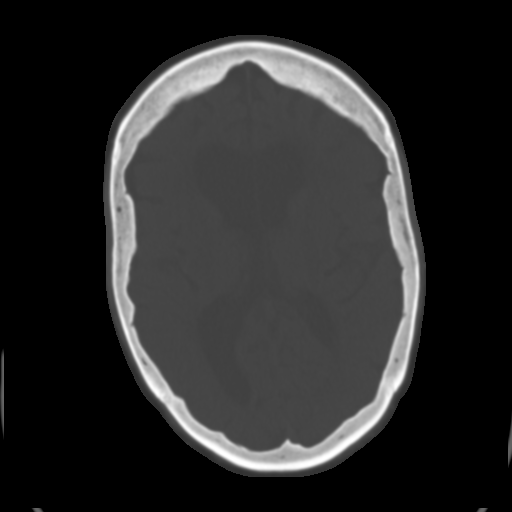
[im 17/31  brain]
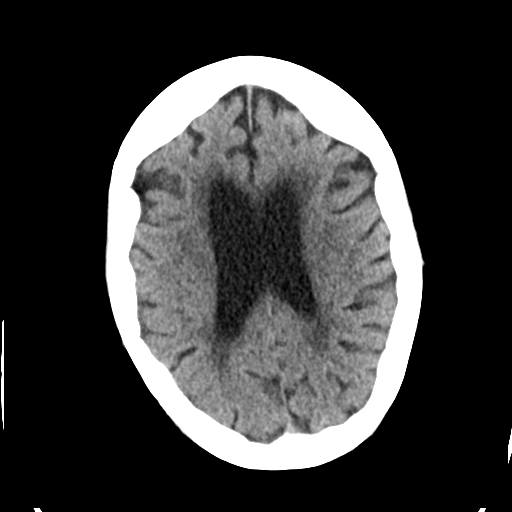
[im 20/31  brain]
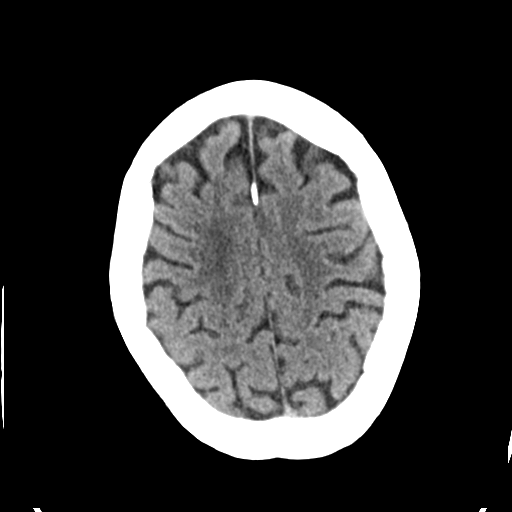
[im 23/31  brain]
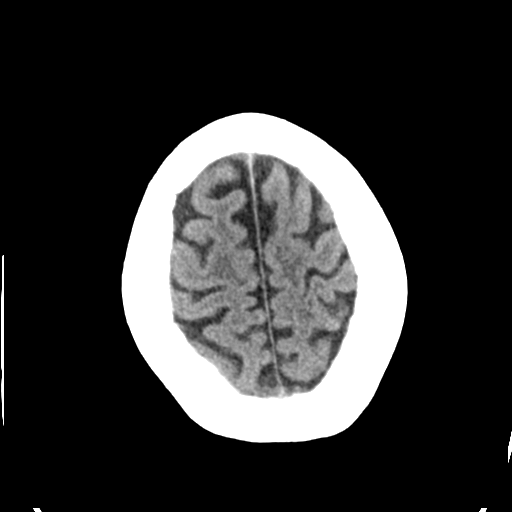
[im 25/31  brain]
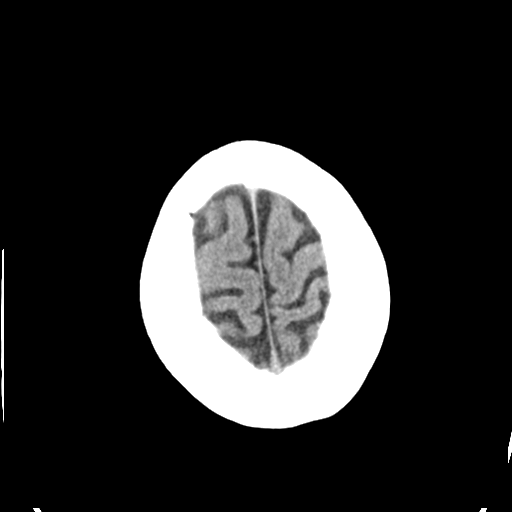
[im 25/31  bone]
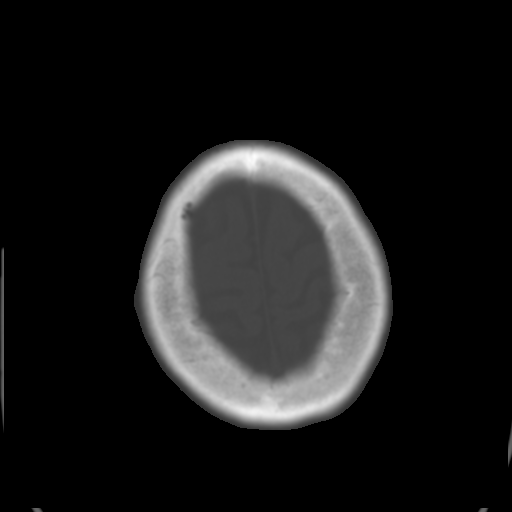
[im 28/31  brain]
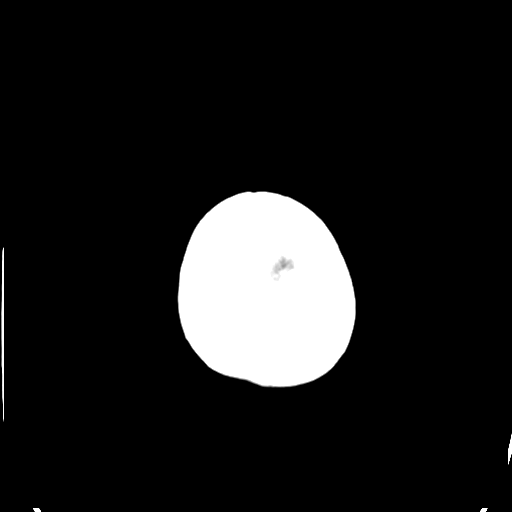

[Series 4: coronal soft tissue · coronal · 0.30mm/px · 3 of 67 slices shown]
[im 23/67  brain]
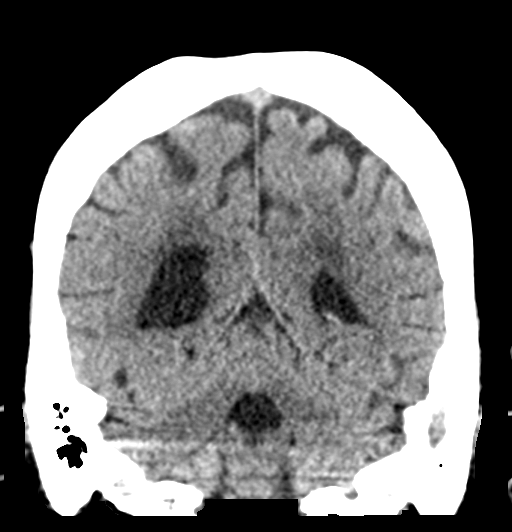
[im 30/67  brain]
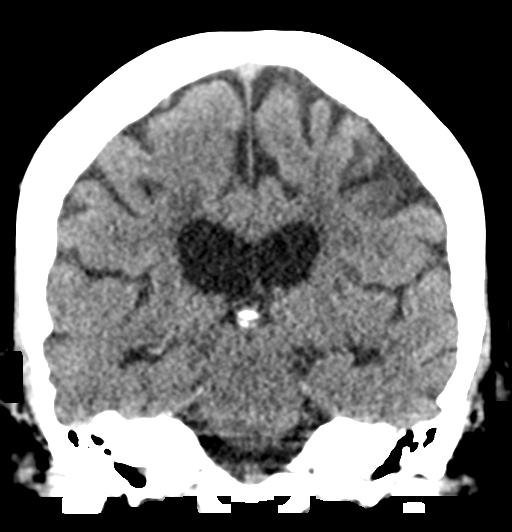
[im 37/67  brain]
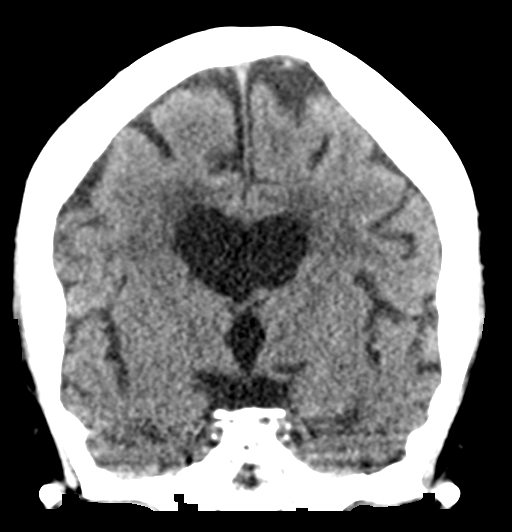

[Series 5: sagittal soft tissue · sagittal · 0.31mm/px · 3 of 51 slices shown]
[im 17/51  brain]
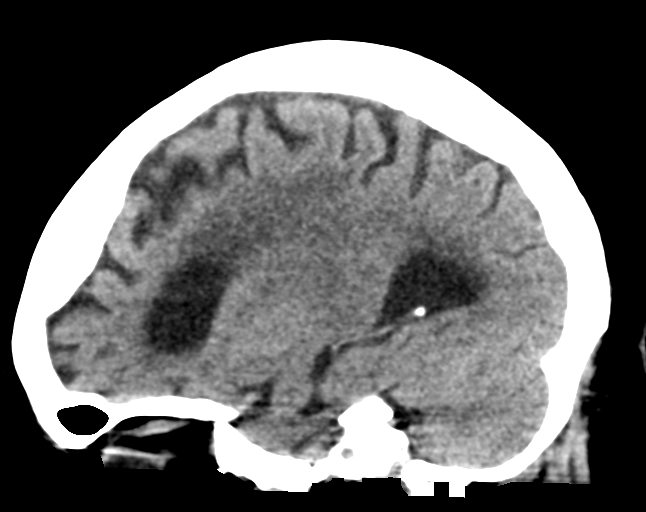
[im 26/51  brain]
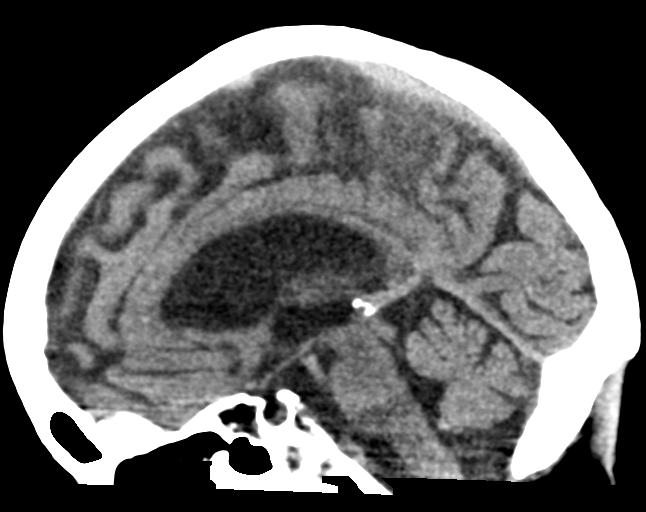
[im 34/51  brain]
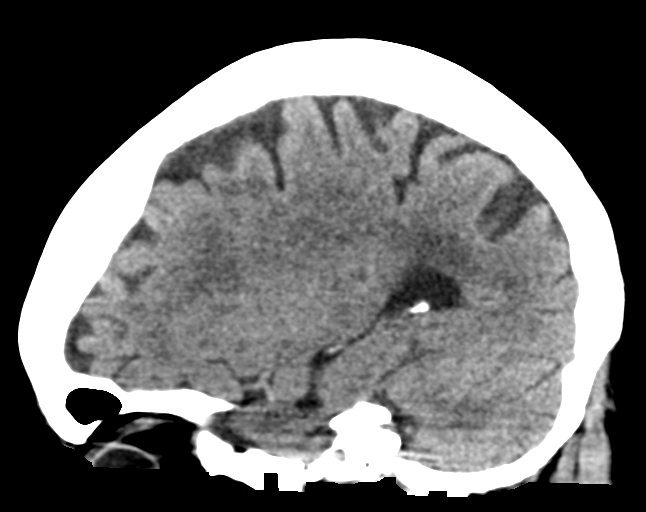

[16 of 47 positions shown; findings below may reference images not displayed]

FINDINGS: Brain: There is central and cortical atrophy. Periventricular white
matter changes are consistent with small vessel disease. There is no
intra or extra-axial fluid collection or mass lesion. The basilar
cisterns and ventricles have a normal appearance. There is no CT
evidence for acute infarction or hemorrhage.

Vascular: There is atherosclerotic calcification of the carotid
siphons.

Skull: Normal. Negative for fracture or focal lesion.

Sinuses/Orbits: No acute finding.

Other: None
IMPRESSION: 1. Atrophy and small vessel disease.
2.  No evidence for acute intracranial abnormality.

## 2019-08-09 ENCOUNTER — Emergency Department
Admission: EM | Admit: 2019-08-09 | Discharge: 2019-08-09 | Disposition: A | Discharge status: Home / Self Care | Payer: Medicare Other | Attending: Emergency Medicine | Admitting: Emergency Medicine

## 2019-08-09 ENCOUNTER — Encounter: Payer: Self-pay | Admitting: Emergency Medicine

## 2019-08-09 DIAGNOSIS — R0789 Other chest pain: Secondary | ICD-10-CM | POA: Diagnosis not present

## 2019-08-09 DIAGNOSIS — R079 Chest pain, unspecified: Secondary | ICD-10-CM

## 2019-08-09 DIAGNOSIS — I10 Essential (primary) hypertension: Secondary | ICD-10-CM | POA: Diagnosis not present

## 2019-08-09 LAB — CBC
HCT: 35.4 % — ABNORMAL LOW (ref 36.0–46.0)
Hemoglobin: 12.5 g/dL (ref 12.0–15.0)
MCH: 29.8 pg (ref 26.0–34.0)
MCHC: 35.3 g/dL (ref 30.0–36.0)
MCV: 84.3 fL (ref 80.0–100.0)
Platelets: 242 10*3/uL (ref 150–400)
RBC: 4.2 MIL/uL (ref 3.87–5.11)
RDW: 14.1 % (ref 11.5–15.5)
WBC: 9 10*3/uL (ref 4.0–10.5)
nRBC: 0 % (ref 0.0–0.2)

## 2019-08-09 LAB — BASIC METABOLIC PANEL
Anion gap: 12 (ref 5–15)
BUN: 18 mg/dL (ref 8–23)
CO2: 20 mmol/L — ABNORMAL LOW (ref 22–32)
Calcium: 9.6 mg/dL (ref 8.9–10.3)
Chloride: 108 mmol/L (ref 98–111)
Creatinine, Ser: 0.64 mg/dL (ref 0.44–1.00)
GFR calc Af Amer: >60 mL/min (ref >60)
GFR calc non Af Amer: >60 mL/min (ref >60)
Glucose, Bld: 103 mg/dL — ABNORMAL HIGH (ref 70–99)
Potassium: 3.6 mmol/L (ref 3.5–5.1)
Sodium: 140 mmol/L (ref 135–145)

## 2019-08-09 LAB — TROPONIN I (HIGH SENSITIVITY)
Troponin I (High Sensitivity): 3 ng/L (ref <18)
Troponin I (High Sensitivity): 4 ng/L (ref <18)

## 2019-08-09 NOTE — ED Provider Notes (Signed)
[facility] Emergency Department Provider Note ____________________________________________   First MD Initiated Contact with Patient 08/09/19 [phone]     (approximate)  I have reviewed the triage vital signs and the nursing notes.   HISTORY  Chief Complaint Chest Pain  Level 5 caveat: History of present illness limited due to cognitive impairment  HPI Melody Stone is a 68 y.o. female with PMH as noted below who presents with chest pain.  Per the caregiver, the patient awoke around 5 AM complaining of chest pain.  When I asked the patient whether she has any pain, she initially said no, but then when I specifically asked about pain in the chest she said that she still was hurting.  She has had episodes like this in the past with a negative work-up.  Past Medical History:  Diagnosis Date  . Anemia   . Aortic insufficiency    a. 10/2015 Echo: Nl LV fxn, mild to moderate AI; b. 02/2017 Echo: EF 60-65%, no rwma, mild AI.  Marland Kitchen Chest pain    a. 10/2015 Ex MV Jefm Bryant): EF 69%, no ischemia; b. 01/2017 Lexiscan MV: EF 45%, no ischemia/infarct-->Low risk.  Marland Kitchen Dyspnea on exertion   . Hepatitis C   . HLD (hyperlipidemia)   . Hypertension   . Mental retardation   . Reflux esophagitis     Patient Active Problem List   Diagnosis Date Noted  . Dysphagia, oropharyngeal 07/08/2016  . Lung nodule, solitary 03/30/2016  . Moderate aortic insufficiency 03/30/2016  . HCV (hepatitis C virus) 02/19/2016  . HLD (hyperlipidemia) 02/19/2016  . BP (high blood pressure) 02/19/2016  . Intellectual disability 02/19/2016  . Esophagitis, reflux 02/19/2016  . Breathlessness on exertion 10/27/2015    Past Surgical History:  Procedure Laterality Date  . ABDOMINAL HYSTERECTOMY    . APPENDECTOMY      Prior to Admission medications   Medication Sig Start Date End Date Taking? Authorizing Provider  Cholecalciferol (VITAMIN D3) 1000 units CAPS Take 2,000 Units by mouth daily.      [provider]  clonazePAM (KLONOPIN) 0.5 MG tablet Take 0.25-0.5 mg by mouth See admin instructions. Take  tablet (0.25mg ) by mouth every morning and 1 tablet (1mg ) by mouth every evening    [provider]  ibuprofen (ADVIL,MOTRIN) 200 MG tablet Take 600 mg by mouth 2 (two) times daily.    [provider]  meclizine (ANTIVERT) 25 MG tablet Take 1 tablet (25 mg total) by mouth 3 (three) times daily as needed for dizziness. 01/09/17   Nance Pear, MD  PARoxetine (PAXIL) 30 MG tablet Take 30 mg by mouth at bedtime.     [provider]  ranitidine (ZANTAC) 150 MG tablet Take 150 mg by mouth 2 (two) times daily.     [provider]  senna (SENOKOT) 8.6 MG tablet Take 2 tablets by mouth daily.    [provider]  verapamil (CALAN-SR) 180 MG CR tablet Take 180 mg by mouth 2 (two) times daily.     [provider]    Allergies Other  Family History  Problem Relation Age of Onset  . Heart attack Mother        CABG in her 62's  . Cancer Mother   . CAD Brother        9  . Hypertension Brother        100    Social History Social History   Tobacco Use  . Smoking status: Never Smoker  .  Smokeless tobacco: Never Used  Substance Use Topics  . Alcohol use: No  . Drug use: No    Review of Systems Level 5 caveat: Unable to obtain review of systems due to cognitive impairment   ____________________________________________   PHYSICAL EXAM:  VITAL SIGNS: ED Triage Vitals [08/09/19 0635]  Enc Vitals Group     BP (!) 148/98     Pulse Rate (!) 102     Resp 18     Temp 98.7 F (37.1 C)     Temp Source Oral     SpO2 98 %     Weight      Height      Head Circumference      Peak Flow      Pain Score      Pain Loc      Pain Edu?      Excl. in GC?     Constitutional: Alert, at baseline mental status per caregiver.  Comfortable appearing. Eyes: Conjunctivae are normal.  Head: Atraumatic. Nose: No  congestion/rhinnorhea. Mouth/Throat: Mucous membranes are moist.   Neck: Normal range of motion.  Cardiovascular: Normal rate, regular rhythm. Grossly normal heart sounds.  Good peripheral circulation.  No chest wall tenderness. Respiratory: Normal respiratory effort.  No retractions. Lungs CTAB. Gastrointestinal: Soft and nontender. No distention.  Genitourinary: No flank tenderness. Musculoskeletal: No lower extremity edema.  Extremities warm and well perfused.  Neurologic: Motor intact in all extremities. Skin:  Skin is warm and dry. No rash noted. Psychiatric: Calm and cooperative.  ____________________________________________   LABS (all labs ordered are listed, but only abnormal results are displayed)  Labs Reviewed  BASIC METABOLIC PANEL - Abnormal; Notable for the following components:      Result Value   CO2 20 (*)    Glucose, Bld 103 (*)    All other components within normal limits  CBC - Abnormal; Notable for the following components:   HCT 35.4 (*)    All other components within normal limits  TROPONIN I (HIGH SENSITIVITY)  TROPONIN I (HIGH SENSITIVITY)   ____________________________________________  EKG  ED ECG REPORT I, Dionne Bucy, the attending physician, personally viewed and interpreted this ECG.  Date: 08/09/2019 EKG Time: 0636 Rate: 97 Rhythm: normal sinus rhythm QRS Axis: normal Intervals: normal ST/T Wave abnormalities: normal Narrative Interpretation: no evidence of acute ischemia; no significant change when compared to EKG of 09/29/2018  ____________________________________________  RADIOLOGY    ____________________________________________   PROCEDURES  Procedure(s) performed: No  Procedures  Critical Care performed: No ____________________________________________   INITIAL IMPRESSION / ASSESSMENT AND PLAN / ED COURSE  Pertinent labs & imaging results that were available during my care of the patient were reviewed by me  and considered in my medical decision making (see chart for details).  68 year old female with PMH as noted above including a history of cognitive impairment presents with chest pain since around 5 AM this morning.  She is not really able to give much history and initially stated she had no pain although then on further questioning reported that she does have chest pain right now.    I reviewed the past medical records in Epic; the patient was previously evaluated in ED in May 2018 for chest pain with a negative work-up.  She subsequently followed up with cardiology and had an echocardiogram that showed no significant findings and appears to have self resolved.  On exam today the patient is very well-appearing.  Her vital signs are normal except for borderline  tachycardia when she arrived.  The remainder of the physical exam is unremarkable.  Her EKG shows no ischemic findings.  Overall given the prior history of similar symptoms with a negative work-up, the nonexertional nature of the pain, and the patient's well appearance and normal EKG, I have a very low suspicion for ACS.  There are no signs or symptoms of DVT and the patient has no specific VTE risk factors, so I do not suspect PE.  Given the well appearance and reassuring vital signs, there is also is no evidence of aortic dissection or other vascular etiology.  We will obtain basic labs, cardiac enzymes x2, and reassess.  ----------------------------------------- 10:52 AM on 08/09/2019 -----------------------------------------  The lab work-up is unremarkable.  Initial and repeat troponins were both negative.  The patient continues to appear comfortable and her heart rate has normalized.  She has no active chest pain at this time.  She is stable for discharge.  I informed the caregiver about the results of the work-up and the follow-up plan.  Return precautions given, and the caregiver expressed  understanding.  ____________________________________________   FINAL CLINICAL IMPRESSION(S) / ED DIAGNOSES  Final diagnoses:  Chest pain, unspecified type      NEW MEDICATIONS STARTED DURING THIS VISIT:  New Prescriptions   No medications on file     Note:  This document was prepared using Dragon voice recognition software and may include unintentional dictation errors.   Dionne BucySiadecki, [place] Wanner, MD 08/09/19 1053

## 2019-08-09 NOTE — ED Notes (Signed)
[facility] from group [facility]

## 2019-08-09 NOTE — ED Triage Notes (Signed)
Pt arrived via POV with care giver from group home. Caregiver reports pt woke this AM screaming that her chest hurt. Pt brought for further evaluation. Pt denies SOB, N/V.

## 2019-08-09 NOTE — Discharge Instructions (Addendum)
Melody Stone should return to the emergency department for new, worsening, or persistent severe chest pain or any other new or worsening symptoms that concern her caregivers or facility staff.  She should follow-up with her primary care doctor within 1 to 2 weeks.

## 2019-08-09 NOTE — ED Notes (Signed)
Attempted to call pt's legal guardian, left HIPAA complaint voice message

## 2020-04-09 ENCOUNTER — Other Ambulatory Visit: Payer: Self-pay | Admitting: Internal Medicine

## 2020-04-09 DIAGNOSIS — Z1231 Encounter for screening mammogram for malignant neoplasm of breast: Secondary | ICD-10-CM

## 2020-04-14 ENCOUNTER — Encounter: Payer: Self-pay | Admitting: Emergency Medicine

## 2020-04-14 ENCOUNTER — Ambulatory Visit
Admission: EM | Admit: 2020-04-14 | Discharge: 2020-04-14 | Disposition: A | Discharge status: Home / Self Care | Payer: Medicare Other | Attending: Family Medicine | Admitting: Family Medicine

## 2020-04-14 ENCOUNTER — Other Ambulatory Visit: Payer: Self-pay

## 2020-04-14 ENCOUNTER — Ambulatory Visit (INDEPENDENT_AMBULATORY_CARE_PROVIDER_SITE_OTHER): Payer: Medicare Other

## 2020-04-14 DIAGNOSIS — J690 Pneumonitis due to inhalation of food and vomit: Secondary | ICD-10-CM

## 2020-04-14 HISTORY — DX: Unspecified dementia, unspecified severity, without behavioral disturbance, psychotic disturbance, mood disturbance, and anxiety: F03.90

## 2020-04-14 MED ORDER — AMOXICILLIN-POT CLAVULANATE 875-125 MG PO TABS: 1.0000 | ORAL_TABLET | Freq: Two times a day (BID) | ORAL | 0 refills | Status: DC

## 2020-04-14 NOTE — ED Provider Notes (Addendum)
[facility]    CSN: [id number] Arrival date & time: 04/14/20  1250      History   Chief Complaint Chief Complaint  Patient presents with  . choked on food   HPI  69 year old female who resides in a group home presents for evaluation the above.  She is accompanied by a caseworker at her group home.  She states that the patient choked on sausage and aborted yesterday morning.  Per her report, back thrusts were done.  This dislodged the food.  Per their protocol, the patient has to be evaluated.  She states that she seems to be doing fine.  No fever.  The patient has no complaints at this time.  No other reported symptoms.  No other complaints.  Past Medical History:  Diagnosis Date  . Anemia   . Aortic insufficiency    a. 10/2015 Echo: Nl LV fxn, mild to moderate AI; b. 02/2017 Echo: EF 60-65%, no rwma, mild AI.  Marland Kitchen Chest pain    a. 10/2015 Ex MV [facility]): EF 69%, no ischemia; b. 01/2017 Lexiscan MV: EF 45%, no ischemia/infarct-->Low risk.  . Dementia (HCC)   . Dyspnea on exertion   . Hepatitis C   . HLD (hyperlipidemia)   . Hypertension   . Mental retardation   . Reflux esophagitis     Patient Active Problem List   Diagnosis Date Noted  . Dysphagia, oropharyngeal 07/08/2016  . Lung nodule, solitary 03/30/2016  . Moderate aortic insufficiency 03/30/2016  . HCV (hepatitis C virus) 02/19/2016  . HLD (hyperlipidemia) 02/19/2016  . BP (high blood pressure) 02/19/2016  . Intellectual disability 02/19/2016  . Esophagitis, reflux 02/19/2016  . Breathlessness on exertion 10/27/2015    Past Surgical History:  Procedure Laterality Date  . ABDOMINAL HYSTERECTOMY    . APPENDECTOMY      OB History   No obstetric history on file.      Home Medications    Prior to Admission medications   Medication Sig Start Date End Date Taking? Authorizing Provider  Cholecalciferol (VITAMIN D3) 1000 units CAPS Take 2,000 Units by mouth daily.    Yes [provider]   clonazePAM (KLONOPIN) 0.5 MG tablet Take 0.25-0.5 mg by mouth See admin instructions. Take  tablet (0.25mg ) by mouth every morning and 1 tablet (1mg ) by mouth every evening   Yes [provider]  donepezil (ARICEPT) 5 MG tablet Take 5 mg by mouth daily. 02/28/20  Yes [provider]  hydrochlorothiazide (HYDRODIURIL) 25 MG tablet Take 25 mg by mouth daily. 02/21/20  Yes [provider]  ibuprofen (ADVIL,MOTRIN) 200 MG tablet Take 600 mg by mouth 2 (two) times daily.   Yes [provider]  loratadine (CLARITIN) 10 MG tablet Take by mouth. 06/25/19 06/24/20 Yes [provider]  memantine (NAMENDA) 10 MG tablet Take 10 mg by mouth 2 (two) times daily. 02/21/20  Yes [provider]  polyethylene glycol powder (GLYCOLAX/MIRALAX) 17 GM/SCOOP powder Take by mouth.   Yes [provider]  senna (SENOKOT) 8.6 MG tablet Take 2 tablets by mouth daily.   Yes [provider]  venlafaxine XR (EFFEXOR-XR) 37.5 MG 24 hr capsule Take 37.5 mg by mouth daily. 02/21/20  Yes [provider]  verapamil (CALAN-SR) 180 MG CR tablet Take 180 mg by mouth 2 (two) times daily.    Yes [provider]  amoxicillin-clavulanate (AUGMENTIN) 875-125 MG tablet Take 1 tablet by mouth 2 (two) times daily. 04/14/20   04/16/20,  DO  PARoxetine (PAXIL) 30 MG tablet Take 30 mg by mouth at bedtime.   04/14/20  [provider]    Family History Family History  Problem Relation Age of Onset  . Heart attack Mother        CABG in her 2's  . Cancer Mother   . CAD Brother        28  . Hypertension Brother        44    Social History Social History   Tobacco Use  . Smoking status: Never Smoker  . Smokeless tobacco: Never Used  Vaping Use  . Vaping Use: Never used  Substance Use Topics  . Alcohol use: No  . Drug use: No     Allergies   Other   Review of Systems Review of Systems  Constitutional: Negative for fever.  HENT:        Recent choking. Concern for aspiration.   Physical Exam Triage Vital Signs ED Triage Vitals  Enc Vitals Group     BP 04/14/20 1311 (!) 179/88     Pulse Rate 04/14/20 1311 97     Resp 04/14/20 1311 18     Temp 04/14/20 1311 (!) 97.4 F (36.3 C)     Temp Source 04/14/20 1311 Temporal     SpO2 04/14/20 1311 97 %     Weight 04/14/20 1312 160 lb (72.6 kg)     Height 04/14/20 1312 5\' 5"  (1.651 m)     Head Circumference --      Peak Flow --      Pain Score 04/14/20 1312 0     Pain Loc --      Pain Edu? --      Excl. in GC? --    Updated Vital Signs BP (!) 179/88 (BP Location: Left Arm)   Pulse 97   Temp (!) 97.4 F (36.3 C) (Temporal)   Resp 18   Ht 5\' 5"  (1.651 m)   Wt 72.6 kg   SpO2 97%   BMI 26.63 kg/m   Visual Acuity Right Eye Distance:   Left Eye Distance:   Bilateral Distance:    Right Eye Near:   Left Eye Near:    Bilateral Near:     Physical Exam Constitutional:      General: She is not in acute distress.    Appearance: Normal appearance.  HENT:     Head: Normocephalic and atraumatic.  Eyes:     General:        Right eye: No discharge.        Left eye: No discharge.     Conjunctiva/sclera: Conjunctivae normal.  Cardiovascular:     Rate and Rhythm: Normal rate and regular rhythm.  Pulmonary:     Effort: Pulmonary effort is normal.     Comments: No appreciable adventitious breath sounds. Neurological:     Mental Status: She is alert.    UC Treatments / Results  Labs (all labs ordered are listed, but only abnormal results are displayed) Labs Reviewed - No data to display  EKG   Radiology DG Chest 2 View  Result Date: 04/14/2020 CLINICAL DATA:  Concern for aspiration. EXAM: CHEST - 2 VIEW COMPARISON:  01/05/2017. FINDINGS: Mediastinum hilar structures normal. Mild left mid lung infiltrate. Mild right base subsegmental atelectasis. No pleural effusion or pneumothorax. Heart size normal. Degenerative changes scoliosis thoracic spine.  IMPRESSION: Mild left mid lung field infiltrate. Mild right base subsegmental atelectasis. Electronically Signed   By:  Thomas  Register   On: 04/14/2020 13:45    Procedures Procedures (including critical care time)  Medications Ordered in UC Medications - No data to display  Initial Impression / Assessment and Plan / UC Course  I have reviewed the triage vital signs and the nursing notes.  Pertinent labs & imaging results that were available during my care of the patient were reviewed by me and considered in my medical decision making (see chart for details).    69 year old female presents for evaluation after choking on food.  Chest x-ray obtained and independently reviewed by me.  Interpretation: Infiltrate, left lung noted.  Given her history, there is concern for aspiration pneumonia.  Treating with Augmentin.  Final Clinical Impressions(s) / UC Diagnoses   Final diagnoses:  Aspiration pneumonia of left lung, unspecified aspiration pneumonia type, unspecified part of lung [facility])   Discharge Instructions   None    ED Prescriptions    Medication Sig Dispense Auth. Provider   amoxicillin-clavulanate (AUGMENTIN) 875-125 MG tablet Take 1 tablet by mouth 2 (two) times daily. 20 tablet Tommie Sams, DO     PDMP not reviewed this encounter.    Tommie Sams, [place] 04/14/20 1456

## 2020-04-14 NOTE — ED Triage Notes (Signed)
Patient in today with a case worker from [facility] who states that patient choked on sausage and a boiled egg yesterday morning at breakfast. Case worker states patient has been doing fine since, but their protocol requires patient to be evaluated for aspiration.

## 2020-04-17 ENCOUNTER — Encounter: Payer: Self-pay | Admitting: Emergency Medicine

## 2020-04-17 ENCOUNTER — Ambulatory Visit
Admission: EM | Admit: 2020-04-17 | Discharge: 2020-04-17 | Disposition: A | Discharge status: Home / Self Care | Payer: Medicare Other | Attending: Family Medicine | Admitting: Family Medicine

## 2020-04-17 ENCOUNTER — Other Ambulatory Visit: Payer: Self-pay

## 2020-04-17 DIAGNOSIS — R079 Chest pain, unspecified: Secondary | ICD-10-CM

## 2020-04-17 NOTE — ED Provider Notes (Signed)
[facility]    CSN: [id number] Arrival date & time: 04/17/20  1715      History   Chief Complaint Chief Complaint  Patient presents with  . Chest Pain  . Hypertension   HPI  69 year old female presents for evaluation of chest pain.  Patient recently seen by me.  Seen for evaluation for possible aspiration.  X-ray revealed an infiltrate.  Patient was placed on Augmentin.  Patient is in a group home.  She has dementia.  She is brought in by a caregiver from her group home.  Per the caregiver, she has been complaining of chest pain since about 3 PM.  Patient puts her hand on the left side of her chest to indicate the location of her pain.  No reports of shortness of breath.  She has no respiratory symptoms.  No fever.  She has eaten today without difficulty.  Caregiver also notes that her blood pressure has been markedly elevated.  BP elevated currently.  No other complaints at this time.  Past Medical History:  Diagnosis Date  . Anemia   . Aortic insufficiency    a. 10/2015 Echo: Nl LV fxn, mild to moderate AI; b. 02/2017 Echo: EF 60-65%, no rwma, mild AI.  Marland Kitchen Chest pain    a. 10/2015 Ex MV Gavin Potters): EF 69%, no ischemia; b. 01/2017 Lexiscan MV: EF 45%, no ischemia/infarct-->Low risk.  . Dementia (HCC)   . Dyspnea on exertion   . Hepatitis C   . HLD (hyperlipidemia)   . Hypertension   . Mental retardation   . Reflux esophagitis     Patient Active Problem List   Diagnosis Date Noted  . Dysphagia, oropharyngeal 07/08/2016  . Lung nodule, solitary 03/30/2016  . Moderate aortic insufficiency 03/30/2016  . HCV (hepatitis C virus) 02/19/2016  . HLD (hyperlipidemia) 02/19/2016  . BP (high blood pressure) 02/19/2016  . Intellectual disability 02/19/2016  . Esophagitis, reflux 02/19/2016  . Breathlessness on exertion 10/27/2015    Past Surgical History:  Procedure Laterality Date  . ABDOMINAL HYSTERECTOMY    . APPENDECTOMY      OB History   No obstetric history  on file.      Home Medications    Prior to Admission medications   Medication Sig Start Date End Date Taking? Authorizing Provider  amoxicillin-clavulanate (AUGMENTIN) 875-125 MG tablet Take 1 tablet by mouth 2 (two) times daily. 04/14/20  Yes Deena Shaub G, DO  Cholecalciferol (VITAMIN D3) 1000 units CAPS Take 2,000 Units by mouth daily.    Yes [provider]  clonazePAM (KLONOPIN) 0.5 MG tablet Take 0.25-0.5 mg by mouth See admin instructions. Take  tablet (0.25mg ) by mouth every morning and 1 tablet (1mg ) by mouth every evening   Yes [provider]  donepezil (ARICEPT) 5 MG tablet Take 5 mg by mouth daily. 02/28/20  Yes [provider]  hydrochlorothiazide (HYDRODIURIL) 25 MG tablet Take 25 mg by mouth daily. 02/21/20  Yes [provider]  ibuprofen (ADVIL,MOTRIN) 200 MG tablet Take 600 mg by mouth 2 (two) times daily.   Yes [provider]  loratadine (CLARITIN) 10 MG tablet Take by mouth. 06/25/19 06/24/20 Yes [provider]  memantine (NAMENDA) 10 MG tablet Take 10 mg by mouth 2 (two) times daily. 02/21/20  Yes [provider]  polyethylene glycol powder (GLYCOLAX/MIRALAX) 17 GM/SCOOP powder Take by mouth.   Yes [provider]  senna (SENOKOT) 8.6 MG tablet Take 2 tablets by mouth daily.   Yes  [provider]  venlafaxine XR (EFFEXOR-XR) 37.5 MG 24 hr capsule Take 37.5 mg by mouth daily. 02/21/20  Yes [provider]  verapamil (CALAN-SR) 180 MG CR tablet Take 180 mg by mouth 2 (two) times daily.    Yes [provider]  PARoxetine (PAXIL) 30 MG tablet Take 30 mg by mouth at bedtime.   04/14/20  [provider]    Family History Family History  Problem Relation Age of Onset  . Heart attack Mother        CABG in her 98's  . Cancer Mother   . CAD Brother        39  . Hypertension Brother        27    Social History Social History   Tobacco Use  . Smoking status: Never  Smoker  . Smokeless tobacco: Never Used  Vaping Use  . Vaping Use: Never used  Substance Use Topics  . Alcohol use: No  . Drug use: No     Allergies   Other   Review of Systems Review of Systems  Constitutional: Negative for appetite change and fever.  Cardiovascular: Positive for chest pain.   Physical Exam Triage Vital Signs ED Triage Vitals  Enc Vitals Group     BP 04/17/20 1739 (!) 174/68     Pulse Rate 04/17/20 1739 63     Resp 04/17/20 1739 18     Temp 04/17/20 1739 98.8 F (37.1 C)     Temp Source 04/17/20 1739 Oral     SpO2 04/17/20 1739 98 %     Weight 04/17/20 1737 160 lb 0.9 oz (72.6 kg)     Height 04/17/20 1737 5\' 5"  (1.651 m)     Head Circumference --      Peak Flow --      Pain Score --      Pain Loc --      Pain Edu? --      Excl. in GC? --    Updated Vital Signs BP (!) 174/68 (BP Location: Right Arm)   Pulse 63   Temp 98.8 F (37.1 C) (Oral)   Resp 18   Ht 5\' 5"  (1.651 m)   Wt 72.6 kg   SpO2 98%   BMI 26.63 kg/m   Visual Acuity Right Eye Distance:   Left Eye Distance:   Bilateral Distance:    Right Eye Near:   Left Eye Near:    Bilateral Near:     Physical Exam Vitals and nursing note reviewed.  Constitutional:      General: She is not in acute distress.    Appearance: Normal appearance. She is not ill-appearing.  HENT:     Head: Normocephalic and atraumatic.  Eyes:     General:        Right eye: No discharge.        Left eye: No discharge.     Conjunctiva/sclera: Conjunctivae normal.  Cardiovascular:     Rate and Rhythm: Normal rate and regular rhythm.  Pulmonary:     Effort: Pulmonary effort is normal.     Breath sounds: Normal breath sounds. No wheezing or rales.  Abdominal:     General: There is no distension.     Palpations: Abdomen is soft.     Tenderness: There is no abdominal tenderness.  Neurological:     Mental Status: She is alert.    UC Treatments / Results  Labs (all labs ordered are listed, but only  abnormal results are displayed) Labs Reviewed - No data to display  EKG Patient: Normal sinus rhythm with rate of 69.  Normal axis.  Nonspecific T wave changes.  No appreciable ST elevation.  Radiology No results found.  Procedures Procedures (including critical care time)  Medications Ordered in UC Medications - No data to display  Initial Impression / Assessment and Plan / UC Course  I have reviewed the triage vital signs and the nursing notes.  Pertinent labs & imaging results that were available during my care of the patient were reviewed by me and considered in my medical decision making (see chart for details).    69 year old female presents with chest pain.  Given the acute nature of her chest pain and the inability to get a thorough and accurate history from the patient, I feel that she needs serial cardiac enzymes and monitoring.  We did not do this here at urgent care.  Sending to the hospital for further evaluation.  Patient is stable to go via private vehicle.  Caregiver is taking her to [facility] as she does not prefer to take her to [facility].  Final Clinical Impressions(s) / UC Diagnoses   Final diagnoses:  Chest pain, unspecified type     Discharge Instructions     Patient experiencing chest pain.  Patient cannot provide reliable history.  Chest pain started at 3 pm. Needs serial cardiac enzymes.  Everlene Other DO [facility]    ED Prescriptions    None     PDMP not reviewed this encounter.   Tommie Sams, [place] 04/17/20 [id number]

## 2020-04-17 NOTE — Discharge Instructions (Signed)
Patient experiencing chest pain.  Patient cannot provide reliable history.  Chest pain started at 3 pm. Needs serial cardiac enzymes.  Everlene Other DO [facility]

## 2020-04-17 NOTE — ED Triage Notes (Signed)
Pt c/o chest pain on her left side and center. Started today about 3 pm. Caregiver states she does not seem to breathing any different. She recently had aspirated pneumonia.

## 2020-04-26 ENCOUNTER — Ambulatory Visit
Admission: EM | Admit: 2020-04-26 | Discharge: 2020-04-26 | Disposition: A | Discharge status: Home / Self Care | Payer: Medicare Other

## 2020-04-26 ENCOUNTER — Other Ambulatory Visit: Payer: Self-pay

## 2020-04-26 DIAGNOSIS — R079 Chest pain, unspecified: Secondary | ICD-10-CM | POA: Diagnosis not present

## 2020-04-26 NOTE — ED Provider Notes (Signed)
[facility]    CSN: [id number] Arrival date & time: 04/26/20  1138      History   Chief Complaint Chief Complaint  Patient presents with  . Chest Pain    HPI Melody Stone is a 69 y.o. female.   Accompanied by a caregiver, patient presents with chest pain since this morning when she choked while eating her breakfast.  Patient is unable to provide a reliable report of her symptoms; she has dementia and mental retardation per her medical record.  Caregiver reports patient frequently complains of chest pain.  Patient was seen at [facility] on 04/14/2020 after she choked on her morning breakfast; diagnosed with aspiration pneumonia; treated with Augmentin.  She was seen again at [facility] on 04/17/2020; diagnosed with chest pain; sent to [facility].  Cardiac work-up was negative on 04/17/2020.  The history is provided by the patient, a caregiver and medical records.    Past Medical History:  Diagnosis Date  . Anemia   . Aortic insufficiency    a. 10/2015 Echo: Nl LV fxn, mild to moderate AI; b. 02/2017 Echo: EF 60-65%, no rwma, mild AI.  Marland Kitchen Chest pain    a. 10/2015 Ex MV Gavin Potters): EF 69%, no ischemia; b. 01/2017 Lexiscan MV: EF 45%, no ischemia/infarct-->Low risk.  . Dementia (HCC)   . Dyspnea on exertion   . Hepatitis C   . HLD (hyperlipidemia)   . Hypertension   . Mental retardation   . Reflux esophagitis     Patient Active Problem List   Diagnosis Date Noted  . Dysphagia, oropharyngeal 07/08/2016  . Lung nodule, solitary 03/30/2016  . Moderate aortic insufficiency 03/30/2016  . HCV (hepatitis C virus) 02/19/2016  . HLD (hyperlipidemia) 02/19/2016  . BP (high blood pressure) 02/19/2016  . Intellectual disability 02/19/2016  . Esophagitis, reflux 02/19/2016  . Breathlessness on exertion 10/27/2015    Past Surgical History:  Procedure Laterality Date  . ABDOMINAL HYSTERECTOMY    . APPENDECTOMY      OB History   No obstetric history on  file.      Home Medications    Prior to Admission medications   Medication Sig Start Date End Date Taking? Authorizing Provider  Cholecalciferol (VITAMIN D3) 1000 units CAPS Take 2,000 Units by mouth daily.    Yes [provider]  clonazePAM (KLONOPIN) 0.5 MG tablet Take 0.25-0.5 mg by mouth See admin instructions. Take  tablet (0.25mg ) by mouth every morning and 1 tablet (1mg ) by mouth every evening   Yes [provider]  docusate sodium (COLACE) 100 MG capsule Take 100 mg by mouth 2 (two) times daily.   Yes [provider]  donepezil (ARICEPT) 5 MG tablet Take 5 mg by mouth daily. 02/28/20  Yes [provider]  hydrochlorothiazide (HYDRODIURIL) 25 MG tablet Take 25 mg by mouth daily. 02/21/20  Yes [provider]  ibuprofen (ADVIL,MOTRIN) 200 MG tablet Take 600 mg by mouth 2 (two) times daily.   Yes [provider]  loratadine (CLARITIN) 10 MG tablet Take by mouth. 06/25/19 06/24/20 Yes [provider]  memantine (NAMENDA) 10 MG tablet Take 10 mg by mouth 2 (two) times daily. 02/21/20  Yes [provider]  polyethylene glycol powder (GLYCOLAX/MIRALAX) 17 GM/SCOOP powder Take by mouth.   Yes [provider]  venlafaxine XR (EFFEXOR-XR) 37.5 MG 24 hr capsule Take 37.5 mg by mouth daily. 02/21/20  Yes [provider]  verapamil (CALAN-SR) 180 MG CR tablet Take  180 mg by mouth 2 (two) times daily.    Yes [provider]  amoxicillin-clavulanate (AUGMENTIN) 875-125 MG tablet Take 1 tablet by mouth 2 (two) times daily. 04/14/20   Tommie Sams, DO  senna (SENOKOT) 8.6 MG tablet Take 2 tablets by mouth daily.    [provider]  PARoxetine (PAXIL) 30 MG tablet Take 30 mg by mouth at bedtime.   04/14/20  [provider]    Family History Family History  Problem Relation Age of Onset  . Heart attack Mother        CABG in her 42's  . Cancer Mother   . CAD Brother        74  .  Hypertension Brother        26    Social History Social History   Tobacco Use  . Smoking status: Never Smoker  . Smokeless tobacco: Never Used  Vaping Use  . Vaping Use: Never used  Substance Use Topics  . Alcohol use: No  . Drug use: No     Allergies   Other   Review of Systems Review of Systems  Constitutional: Negative for appetite change and fever.  HENT: Negative for congestion, ear pain, sore throat and trouble swallowing.   Eyes: Negative for pain and visual disturbance.  Respiratory: Negative for cough and shortness of breath.   Cardiovascular: Positive for chest pain. Negative for palpitations and leg swelling.  Gastrointestinal: Negative for abdominal pain, diarrhea and vomiting.  Genitourinary: Negative for dysuria and hematuria.  Musculoskeletal: Negative for arthralgias and back pain.  Skin: Negative for color change and rash.  Neurological: Negative for seizures and syncope.  All other systems reviewed and are negative.    Physical Exam Triage Vital Signs ED Triage Vitals [04/26/20 1157]  Enc Vitals Group     BP 99/79     Pulse Rate 68     Resp 18     Temp 98.8 F (37.1 C)     Temp Source Oral     SpO2 100 %     Weight      Height      Head Circumference      Peak Flow      Pain Score      Pain Loc      Pain Edu?      Excl. in GC?    No data found.  Updated Vital Signs BP 99/79 (BP Location: Left Arm)   Pulse 68   Temp 98.8 F (37.1 C) (Oral)   Resp 18   SpO2 100%   Visual Acuity Right Eye Distance:   Left Eye Distance:   Bilateral Distance:    Right Eye Near:   Left Eye Near:    Bilateral Near:     Physical Exam Vitals and nursing note reviewed.  Constitutional:      General: She is not in acute distress.    Appearance: She is well-developed. She is not ill-appearing.  HENT:     Head: Normocephalic and atraumatic.     Mouth/Throat:     Mouth: Mucous membranes are moist.  Eyes:     Conjunctiva/sclera: Conjunctivae  normal.  Cardiovascular:     Rate and Rhythm: Normal rate and regular rhythm.     Heart sounds: Normal heart sounds. No murmur heard.   Pulmonary:     Effort: Pulmonary effort is normal. No respiratory distress.     Breath sounds: Normal breath sounds.  Abdominal:  Palpations: Abdomen is soft.     Tenderness: There is no abdominal tenderness. There is no guarding or rebound.  Musculoskeletal:     Cervical back: Neck supple.     Right lower leg: No edema.     Left lower leg: No edema.  Skin:    General: Skin is warm and dry.  Neurological:     Mental Status: She is alert. Mental status is at baseline.      UC Treatments / Results  Labs (all labs ordered are listed, but only abnormal results are displayed) Labs Reviewed - No data to display  EKG   Radiology No results found.  Procedures Procedures (including critical care time)  Medications Ordered in UC Medications - No data to display  Initial Impression / Assessment and Plan / UC Course  I have reviewed the triage vital signs and the nursing notes.  Pertinent labs & imaging results that were available during my care of the patient were reviewed by me and considered in my medical decision making (see chart for details).   Chest pain.  EKG shows sinus rhythm, rate 76, no ST elevation, compared to previous from December 2020.  Instructed caregiver to schedule an appointment for this patient with her PCP tomorrow or as soon as possible.  Instructed her to downgrade her diet to pured until she is evaluated by her PCP due to her recent choking episodes.  Instructed her to go to the ED if she has acute chest pain or other concerning symptoms.  Caregiver acknowledges plan of care.   Final Clinical Impressions(s) / UC Diagnoses   Final diagnoses:  Chest pain, unspecified type     Discharge Instructions     Schedule an appointment for this patient to see her primary care provider as soon as possible to review her  recent episodes of choking while eating.  Her diet consistency may need to be modified.  Downgrade her diet to pured until she is evaluated by her primary care provider.  Go to the emergency department if she has acute chest pain or other concerning symptoms.        ED Prescriptions    None     PDMP not reviewed this encounter.   Mickie Bail, NP 04/26/20 1243

## 2020-04-26 NOTE — ED Triage Notes (Signed)
Patient caregiver is present with patient. Reports that this morning while have breakfast at 9am she choked while having a boiled egg, sausage biscuit and fruit. States that she has been complaining of right side chest pain since. Caregiver states that she often complains of chest pain. States that she is on a chopped diet and all of her food is chopped up.

## 2020-04-26 NOTE — Discharge Instructions (Addendum)
Schedule an appointment for this patient to see her primary care provider as soon as possible to review her recent episodes of choking while eating.  Her diet consistency may need to be modified.  Downgrade her diet to pured until she is evaluated by her primary care provider.  Go to the emergency department if she has acute chest pain or other concerning symptoms.

## 2020-04-30 ENCOUNTER — Other Ambulatory Visit: Payer: Self-pay | Admitting: Internal Medicine

## 2020-04-30 ENCOUNTER — Ambulatory Visit
Admission: RE | Admit: 2020-04-30 | Discharge: 2020-04-30 | Disposition: A | Discharge status: Home / Self Care | Payer: Medicare Other | Source: Ambulatory Visit | Attending: Internal Medicine | Admitting: Internal Medicine

## 2020-04-30 DIAGNOSIS — R1312 Dysphagia, oropharyngeal phase: Secondary | ICD-10-CM

## 2020-04-30 DIAGNOSIS — Z1231 Encounter for screening mammogram for malignant neoplasm of breast: Secondary | ICD-10-CM | POA: Diagnosis not present

## 2020-04-30 DIAGNOSIS — T17308A Unspecified foreign body in larynx causing other injury, initial encounter: Secondary | ICD-10-CM

## 2020-04-30 DIAGNOSIS — T17908A Unspecified foreign body in respiratory tract, part unspecified causing other injury, initial encounter: Secondary | ICD-10-CM

## 2020-05-06 ENCOUNTER — Other Ambulatory Visit: Payer: Self-pay

## 2020-05-06 ENCOUNTER — Ambulatory Visit
Admission: RE | Admit: 2020-05-06 | Discharge: 2020-05-06 | Disposition: A | Discharge status: Home / Self Care | Payer: Medicare Other | Source: Ambulatory Visit | Attending: Internal Medicine | Admitting: Internal Medicine

## 2020-05-06 DIAGNOSIS — T17308A Unspecified foreign body in larynx causing other injury, initial encounter: Secondary | ICD-10-CM | POA: Insufficient documentation

## 2020-05-06 DIAGNOSIS — R1312 Dysphagia, oropharyngeal phase: Secondary | ICD-10-CM | POA: Diagnosis present

## 2020-05-06 DIAGNOSIS — T17908A Unspecified foreign body in respiratory tract, part unspecified causing other injury, initial encounter: Secondary | ICD-10-CM | POA: Diagnosis present

## 2020-05-06 NOTE — Therapy (Signed)
[facility] DIAGNOSTIC RADIOLOGY [address] [place], [place], [address] Phone: [phone]   Fax:     Modified Barium Swallow  Patient Details  Name: Melody Stone MRN: [id number] Date of Birth: 22-Jul-1951 No data recorded  Encounter Date: 05/06/2020   End of Session - 05/06/20 1419    Visit Number 1    Number of Visits 1    Date for SLP Re-Evaluation 05/06/20           Past Medical History:  Diagnosis Date  . Anemia   . Aortic insufficiency    a. 10/2015 Echo: Nl LV fxn, mild to moderate AI; b. 02/2017 Echo: EF 60-65%, no rwma, mild AI.  Marland Kitchen Chest pain    a. 10/2015 Ex MV Gavin Potters): EF 69%, no ischemia; b. 01/2017 Lexiscan MV: EF 45%, no ischemia/infarct-->Low risk.  . Dementia (HCC)   . Dyspnea on exertion   . Hepatitis C   . HLD (hyperlipidemia)   . Hypertension   . Mental retardation   . Reflux esophagitis     Past Surgical History:  Procedure Laterality Date  . ABDOMINAL HYSTERECTOMY    . APPENDECTOMY      There were no vitals filed for this visit.   Subjective: Patient behavior: (alertness, ability to follow instructions, etc.): Patient is intellectually disabled but able to follow simple directions   Chief complaint: Recent choking event; prior MBS in 2017              Objective:  Radiological Procedure: A videoflouroscopic evaluation of oral-preparatory, reflex initiation, and pharyngeal phases of the swallow was performed; as well as a screening of the upper esophageal phase.   I. POSTURE: Upright in MBS chair; patient is restless and moves a lot.  Most of each swallow was successfully captured   II. VIEW: Lateral  III. COMPENSATORY STRATEGIES: N/A IV. BOLUSES ADMINISTERED:         Thin Liquid: 3 consecutive         Nectar-thick Liquid:  2 moderate size              Puree: 2 teaspoon presentations         Mechanical Soft: 1/4 graham cracker in applesauce   V. RESULTS OF EVALUATION: A. ORAL PREPARATORY  PHASE: (The lips, tongue, and velum are observed for strength and coordination)       **Overall Severity Rating: Mild; disorganized oral management and no chewing.  Patient used tongue to push cracker in applesauce to roof of her mouth.  The applesauce softened the cracker, and the tongue movement broke it into pieces.    B. SWALLOW INITIATION/REFLEX: (The reflex is normal if "triggered" by the time the bolus reached the base of the tongue)       **Overall Severity Rating: Mild; triggers at the valleculae. One episode of distraction with bolus within the valleculae until prompted to swallow   C. PHARYNGEAL PHASE: (Pharyngeal function is normal if the bolus shows rapid, smooth, and continuous transit through the pharynx and there is no pharyngeal residue after the swallow)       **Overall Severity Rating: WNL   D. LARYNGEAL PENETRATION: (Material entering into the laryngeal inlet/vestibule but not aspirated) None   E. ASPIRATION: None   F. ESOPHAGEAL PHASE: (Screening of the upper esophagus): Aerophagia, otherwise no abnormality within the observable cervical esophagus   ASSESSMENT: This 69 year old woman, with intellectual disability, recent choking event, and chest X-ray suggestive of aspiration; is presenting with mild  oral dysphagia characterized by easily distracted from task of swallowing, disorganized oral management, and minimal mastication of a graham cracker.  Timing of the pharyngeal swallow is minimally delayed.  Aspects of the pharyngeal stage of swallowing including tongue base retraction, hyolaryngeal excursion, epiglottic inversion, duration/amplitude of UES opening, and laryngeal vestibule closure at the height of the swallow are within normal limits.  There is no pharyngeal residue.  There was no observed laryngeal penetration or aspiration.  There is minimal risk for prandial aspiration. However, in view of inadequate mastication and poor attention, the patient is at risk for  aspirating during a choking or distracted event.  Additionally, if the patient has GERD, she may be at risk for aspiration of refluxed material.  Recommend close supervision and a dysphagia II diet (foods that do not require chewing and are inherently cohesive).   PLAN/RECOMMENDATIONS:               A. Diet: Dysphagia II with extra sauces/gravies; thin liquid   [Dysphagia II:  Foods are moist, soft, easy to chew, and easily form into a cohesive bolus.  The diet is intended to be easy to chew foods.  Moistened ground meats (pieces should not exceed 1/4 inch cube), vegetables cooked to a soft mashable texture, soft cooked or canned fruits, and mashed bananas are included.  Some mixed textures are expected to be tolerated.  More frequent feedings may find to be beneficial.  Food and fluid intake should be closely monitored.]                B. Swallowing Precautions: Provide supervision to ensure attention to swallowing and reduce risk for choking               C. Recommended consultation to: behavioral specialist to implement appropriate feeding program if needed               D. Therapy recommendations: home health speech therapy consult to determine appropriate diet consistency and feeding guidelines               E. Results and recommendations were discussed with the patient's caregiver and final report routed to the referring MD and the patient's group home.  Patient will benefit from skilled therapeutic intervention in order to improve the following deficits and impairments:   Oropharyngeal dysphagia - Plan: DG SWALLOW FUNC OP MEDICARE SPEECH PATH, DG SWALLOW FUNC OP MEDICARE SPEECH PATH  Choking, initial encounter - Plan: DG SWALLOW FUNC OP MEDICARE SPEECH PATH, DG SWALLOW FUNC OP MEDICARE SPEECH PATH  Aspiration into airway, initial encounter - Plan: DG SWALLOW FUNC OP MEDICARE SPEECH PATH, DG SWALLOW FUNC OP MEDICARE SPEECH PATH        Problem List Patient Active Problem List    Diagnosis Date Noted  . Dysphagia, oropharyngeal 07/08/2016  . Lung nodule, solitary 03/30/2016  . Moderate aortic insufficiency 03/30/2016  . HCV (hepatitis C virus) 02/19/2016  . HLD (hyperlipidemia) 02/19/2016  . BP (high blood pressure) 02/19/2016  . Intellectual disability 02/19/2016  . Esophagitis, reflux 02/19/2016  . Breathlessness on exertion 10/27/2015    Dollene Primrose, MS/CCC- SLP  Leandrew Koyanagi 05/06/2020, 2:20 PM  [facility] DIAGNOSTIC RADIOLOGY [address] [place], [place], [address] Phone: [phone]   Fax:     Name: Melody Stone MRN: [id number] Date of Birth: 22-Aug-1951

## 2020-06-29 ENCOUNTER — Emergency Department: Payer: Medicare Other

## 2020-06-29 ENCOUNTER — Emergency Department
Admission: EM | Admit: 2020-06-29 | Discharge: 2020-06-29 | Disposition: A | Discharge status: Home / Self Care | Payer: Medicare Other | Attending: Emergency Medicine | Admitting: Emergency Medicine

## 2020-06-29 ENCOUNTER — Other Ambulatory Visit: Payer: Self-pay

## 2020-06-29 DIAGNOSIS — I10 Essential (primary) hypertension: Secondary | ICD-10-CM | POA: Insufficient documentation

## 2020-06-29 DIAGNOSIS — R109 Unspecified abdominal pain: Secondary | ICD-10-CM | POA: Insufficient documentation

## 2020-06-29 DIAGNOSIS — F0390 Unspecified dementia without behavioral disturbance: Secondary | ICD-10-CM | POA: Diagnosis not present

## 2020-06-29 DIAGNOSIS — Z79899 Other long term (current) drug therapy: Secondary | ICD-10-CM | POA: Insufficient documentation

## 2020-06-29 DIAGNOSIS — E876 Hypokalemia: Secondary | ICD-10-CM | POA: Insufficient documentation

## 2020-06-29 DIAGNOSIS — Z20822 Contact with and (suspected) exposure to covid-19: Secondary | ICD-10-CM | POA: Diagnosis not present

## 2020-06-29 DIAGNOSIS — R531 Weakness: Secondary | ICD-10-CM | POA: Diagnosis not present

## 2020-06-29 DIAGNOSIS — I7090 Unspecified atherosclerosis: Secondary | ICD-10-CM | POA: Diagnosis not present

## 2020-06-29 LAB — URINALYSIS, COMPLETE (UACMP) WITH MICROSCOPIC
Bacteria, UA: NONE SEEN
Bilirubin Urine: NEGATIVE
Glucose, UA: NEGATIVE mg/dL
Hgb urine dipstick: NEGATIVE
Ketones, ur: NEGATIVE mg/dL
Leukocytes,Ua: NEGATIVE
Nitrite: NEGATIVE
Protein, ur: NEGATIVE mg/dL
Specific Gravity, Urine: 1.009 (ref 1.005–1.030)
Squamous Epithelial / HPF: NONE SEEN (ref 0–5)
pH: 7 (ref 5.0–8.0)

## 2020-06-29 LAB — RESPIRATORY PANEL BY RT PCR (FLU A&B, COVID)
Influenza A by PCR: NEGATIVE
Influenza B by PCR: NEGATIVE
SARS Coronavirus 2 by RT PCR: NEGATIVE

## 2020-06-29 LAB — CBC
HCT: 33.8 % — ABNORMAL LOW (ref 36.0–46.0)
Hemoglobin: 11.6 g/dL — ABNORMAL LOW (ref 12.0–15.0)
MCH: 30.2 pg (ref 26.0–34.0)
MCHC: 34.3 g/dL (ref 30.0–36.0)
MCV: 88 fL (ref 80.0–100.0)
Platelets: 235 10*3/uL (ref 150–400)
RBC: 3.84 MIL/uL — ABNORMAL LOW (ref 3.87–5.11)
RDW: 15.1 % (ref 11.5–15.5)
WBC: 12.1 10*3/uL — ABNORMAL HIGH (ref 4.0–10.5)
nRBC: 0 % (ref 0.0–0.2)

## 2020-06-29 LAB — BASIC METABOLIC PANEL
Anion gap: 9 (ref 5–15)
BUN: 14 mg/dL (ref 8–23)
CO2: 28 mmol/L (ref 22–32)
Calcium: 9.2 mg/dL (ref 8.9–10.3)
Chloride: 103 mmol/L (ref 98–111)
Creatinine, Ser: 0.64 mg/dL (ref 0.44–1.00)
GFR, Estimated: >60 mL/min (ref >60)
Glucose, Bld: 98 mg/dL (ref 70–99)
Potassium: 3 mmol/L — ABNORMAL LOW (ref 3.5–5.1)
Sodium: 140 mmol/L (ref 135–145)

## 2020-06-29 LAB — HEPATIC FUNCTION PANEL
ALT: 22 U/L (ref 0–44)
AST: 22 U/L (ref 15–41)
Albumin: 3.8 g/dL (ref 3.5–5.0)
Alkaline Phosphatase: 88 U/L (ref 38–126)
Bilirubin, Direct: <0.1 mg/dL (ref 0.0–0.2)
Total Bilirubin: 0.8 mg/dL (ref 0.3–1.2)
Total Protein: 6.8 g/dL (ref 6.5–8.1)

## 2020-06-29 LAB — LACTIC ACID, PLASMA: Lactic Acid, Venous: 1.2 mmol/L (ref 0.5–1.9)

## 2020-06-29 LAB — TROPONIN I (HIGH SENSITIVITY)
Troponin I (High Sensitivity): 6 ng/L (ref <18)
Troponin I (High Sensitivity): 6 ng/L (ref <18)

## 2020-06-29 MED ORDER — POTASSIUM CHLORIDE CRYS ER 20 MEQ PO TBCR
40.0000 meq | EXTENDED_RELEASE_TABLET | Freq: Once | ORAL | Status: AC
  Administered 2020-06-29: 40 meq via ORAL
  Filled 2020-06-29: qty 2

## 2020-06-29 MED ORDER — IOHEXOL 300 MG/ML  SOLN
100.0000 mL | Freq: Once | INTRAMUSCULAR | Status: AC | PRN
  Administered 2020-06-29: 100 mL via INTRAVENOUS

## 2020-06-29 NOTE — ED Notes (Signed)
Pt assisted to restroom with minimal assist

## 2020-06-29 NOTE — ED Triage Notes (Signed)
Pt here via [facility] from [employer] services with "not feeling well". Staff here with pt states that pt was unresponsive this morning and hard to arouse but then she came around and had normal vitals. Pt alert but questionable orientation. Pt NAD in triage.

## 2020-06-29 NOTE — ED Notes (Signed)
FIRST NURSE: Pt to ER via EMS from group home for not acting herself.  EMS personnel familiar with pt and states she is her normal to them.  Staff member to arrive to be with pt due to MR.

## 2020-06-29 NOTE — ED Notes (Signed)
Patient transported to CT 

## 2020-06-29 NOTE — ED Notes (Signed)
Verbal from MD that he would like EKG on pt

## 2020-06-29 NOTE — ED Provider Notes (Signed)
[facility] Emergency Department Provider Note  ____________________________________________   First MD Initiated Contact with Patient 06/29/20 1521     (approximate)  I have reviewed the triage vital signs and the nursing notes.   HISTORY  Chief Complaint Weakness   HPI Rael Tilly Drozdowski is a 69 y.o. female has medical history of severe developmental delay, HTN, HDL, hep C, dementia, aortic insufficiency, anemia, and GERD who presents accompanied by staff member from her group home is reportedly patient was more difficult to arouse than normal this morning.  However on arousal my staff members patient had no complaints and a normal vital signs but they want her to get checked out.  On my assessment history is very limited from the patient and she endorses some mild abdominal pain but no other acute complaints.         Past Medical History:  Diagnosis Date  . Anemia   . Aortic insufficiency    a. 10/2015 Echo: Nl LV fxn, mild to moderate AI; b. 02/2017 Echo: EF 60-65%, no rwma, mild AI.  Marland Kitchen Chest pain    a. 10/2015 Ex MV Gavin Potters): EF 69%, no ischemia; b. 01/2017 Lexiscan MV: EF 45%, no ischemia/infarct-->Low risk.  . Dementia (HCC)   . Dyspnea on exertion   . Hepatitis C   . HLD (hyperlipidemia)   . Hypertension   . Mental retardation   . Reflux esophagitis     Patient Active Problem List   Diagnosis Date Noted  . Dysphagia, oropharyngeal 07/08/2016  . Lung nodule, solitary 03/30/2016  . Moderate aortic insufficiency 03/30/2016  . HCV (hepatitis C virus) 02/19/2016  . HLD (hyperlipidemia) 02/19/2016  . BP (high blood pressure) 02/19/2016  . Intellectual disability 02/19/2016  . Esophagitis, reflux 02/19/2016  . Breathlessness on exertion 10/27/2015    Past Surgical History:  Procedure Laterality Date  . ABDOMINAL HYSTERECTOMY    . APPENDECTOMY      Prior to Admission medications   Medication Sig Start Date End Date Taking? Authorizing  Provider  amoxicillin-clavulanate (AUGMENTIN) 875-125 MG tablet Take 1 tablet by mouth 2 (two) times daily. 04/14/20   Tommie Sams, DO  Cholecalciferol (VITAMIN D3) 1000 units CAPS Take 2,000 Units by mouth daily.     [provider]  clonazePAM (KLONOPIN) 0.5 MG tablet Take 0.25-0.5 mg by mouth See admin instructions. Take  tablet (0.25mg ) by mouth every morning and 1 tablet (1mg ) by mouth every evening    [provider]  docusate sodium (COLACE) 100 MG capsule Take 100 mg by mouth 2 (two) times daily.    [provider]  donepezil (ARICEPT) 5 MG tablet Take 5 mg by mouth daily. 02/28/20   [provider]  hydrochlorothiazide (HYDRODIURIL) 25 MG tablet Take 25 mg by mouth daily. 02/21/20   [provider]  ibuprofen (ADVIL,MOTRIN) 200 MG tablet Take 600 mg by mouth 2 (two) times daily.    [provider]  loratadine (CLARITIN) 10 MG tablet Take by mouth. 06/25/19 06/24/20  [provider]  memantine (NAMENDA) 10 MG tablet Take 10 mg by mouth 2 (two) times daily. 02/21/20   [provider]  polyethylene glycol powder (GLYCOLAX/MIRALAX) 17 GM/SCOOP powder Take by mouth.    [provider]  senna (SENOKOT) 8.6 MG tablet Take 2 tablets by mouth daily.    [provider]  venlafaxine XR (EFFEXOR-XR) 37.5 MG 24 hr capsule Take 37.5 mg by mouth daily. 02/21/20   [provider]  verapamil (CALAN-SR) 180 MG CR tablet Take 180 mg by mouth 2 (two) times daily.     [provider]  PARoxetine (PAXIL) 30 MG tablet Take 30 mg by mouth at bedtime.   04/14/20  [provider]    Allergies Other  Family History  Problem Relation Age of Onset  . Heart attack Mother        CABG in her 55's  . Cancer Mother   . CAD Brother        57  . Hypertension Brother        17    Social History Social History   Tobacco Use  . Smoking status: Never Smoker  . Smokeless tobacco: Never Used  Vaping  Use  . Vaping Use: Never used  Substance Use Topics  . Alcohol use: No  . Drug use: No    Review of Systems  Review of Systems  Unable to perform ROS: Other    Secondary to mild mental delay.  Caregiver at bedside states she is not sure if the patient has had any coughing, vomiting, diarrhea, dysuria, recent falls. ____________________________________________   PHYSICAL EXAM:  VITAL SIGNS: ED Triage Vitals  Enc Vitals Group     BP 06/29/20 1137 138/65     Pulse Rate 06/29/20 1137 (!) 59     Resp 06/29/20 1137 18     Temp 06/29/20 1137 98.4 F (36.9 C)     Temp Source 06/29/20 1137 Oral     SpO2 06/29/20 1137 99 %     Weight 06/29/20 1139 160 lb 0.9 oz (72.6 kg)     Height 06/29/20 1139 5\' 3"  (1.6 m)     Head Circumference --      Peak Flow --      Pain Score 06/29/20 1138 0     Pain Loc --      Pain Edu? --      Excl. in GC? --    Vitals:   06/29/20 1137 06/29/20 1601  BP: 138/65 (!) 152/56  Pulse: (!) 59 64  Resp: 18 20  Temp: 98.4 F (36.9 C)   SpO2: 99% 100%   Physical Exam Vitals and nursing note reviewed.  Constitutional:      General: She is not in acute distress.    Appearance: She is well-developed.  HENT:     Head: Normocephalic and atraumatic.     Right Ear: External ear normal.     Left Ear: External ear normal.     Nose: Nose normal.  Eyes:     Conjunctiva/sclera: Conjunctivae normal.  Cardiovascular:     Rate and Rhythm: Normal rate and regular rhythm.     Heart sounds: No murmur heard.   Pulmonary:     Effort: Pulmonary effort is normal. No respiratory distress.     Breath sounds: Normal breath sounds.  Abdominal:     Palpations: Abdomen is soft.     Tenderness: There is no abdominal tenderness.  Musculoskeletal:     Cervical back: Neck supple.  Skin:    General: Skin is warm and dry.  Neurological:     Mental Status: She is alert. Mental status is at baseline. She is confused.     Follows commands in all extremities.   PERRLA.  EOMI.  Oropharynx unremarkable.  Abdomen is soft nontender throughout.  No CVA tenderness. ____________________________________________   LABS (all labs ordered are listed, but only abnormal results are displayed)  Labs Reviewed  BASIC METABOLIC  PANEL - Abnormal; Notable for the following components:      Result Value   Potassium 3.0 (*)    All other components within normal limits  CBC - Abnormal; Notable for the following components:   WBC 12.1 (*)    RBC 3.84 (*)    Hemoglobin 11.6 (*)    HCT 33.8 (*)    All other components within normal limits  URINALYSIS, COMPLETE (UACMP) WITH MICROSCOPIC - Abnormal; Notable for the following components:   Color, Urine YELLOW (*)    APPearance CLEAR (*)    All other components within normal limits  RESPIRATORY PANEL BY RT PCR (FLU A&B, COVID)  HEPATIC FUNCTION PANEL  LACTIC ACID, PLASMA  CBG MONITORING, ED  TROPONIN I (HIGH SENSITIVITY)  TROPONIN I (HIGH SENSITIVITY)   ____________________________________________  EKG  Sinus rhythm with a ventricular rate of 64, normal axis, unremarkable intervals, no evidence of acute ischemia or other significant underlying rhythm. ____________________________________________  RADIOLOGY  ED MD interpretation: Chest x-ray showed evidence of pneumonia, no thorax, effusion, edema, or other acute intrathoracic process.  CT abdomen pelvis shows no evidence of appendicitis, diverticulitis, SBO, acute cholecystitis, or other emergent intra-abdominal pathology.  Official radiology report(s): DG Chest 1 View  Result Date: 06/29/2020 CLINICAL DATA:  Feeling unwell. EXAM: CHEST  1 VIEW COMPARISON:  None. FINDINGS: The heart size and mediastinal contours are within normal limits. Both lungs are clear. The visualized skeletal structures are unremarkable. IMPRESSION: No active disease. Electronically Signed   By: Lupita Raider M.D.   On: 06/29/2020 16:10   CT ABDOMEN PELVIS W CONTRAST  Result Date:  06/29/2020 CLINICAL DATA:  Abdominal pain EXAM: CT ABDOMEN AND PELVIS WITH CONTRAST TECHNIQUE: Multidetector CT imaging of the abdomen and pelvis was performed using the standard protocol following bolus administration of intravenous contrast. CONTRAST:  OMNIPAQUE IOHEXOL 300 MG/ML  SOLN COMPARISON:  2013 FINDINGS: Lower chest: No acute abnormality. Hepatobiliary: No focal liver abnormality is seen. No gallstones, gallbladder wall thickening, or biliary dilatation. Pancreas: Unremarkable. Spleen: Subcentimeter calcified granuloma.  Otherwise unremarkable Adrenals/Urinary Tract: Adrenals are unremarkable. Small cyst of the interpolar left kidney. Ureters are normal in caliber. Bladder is unremarkable. Stomach/Bowel: Small hiatal hernia with distal esophageal wall thickening. Stomach is otherwise unremarkable. Bowel is normal in caliber. Stool moderately distends the rectum. Vascular/Lymphatic: Aortic atherosclerosis. No enlarged lymph nodes identified. Reproductive: Status post hysterectomy. No adnexal masses. Other: No ascites.  No abdominal wall hernia. Musculoskeletal: No acute osseous abnormality. IMPRESSION: No acute abnormality. Small hiatal hernia. Distal esophageal wall thickening may reflect reflux esophagitis. This was present previously. Aortic atherosclerosis. Electronically Signed   By: Guadlupe Spanish M.D.   On: 06/29/2020 16:38    ____________________________________________   PROCEDURES  Procedure(s) performed (including Critical Care):  Procedures   ____________________________________________   INITIAL IMPRESSION / ASSESSMENT AND PLAN / ED COURSE        Patient presents with [place] to history exam with concerns that she was more sleepy and difficult to arouse per caregiver at bedside than usual today although patient denies any symptoms other than some mild lower abdominal pain.  Patient is hypertensive with BP of 152/56 otherwise stable vital signs on room air.  Exam as above  patient at her neurological baseline unable to provide much history but otherwise has a reassuring abdominal exam.  Differential includes but is not limited to cystitis, pyelonephritis, appendicitis, acute cholecystitis, AAA, ACS, diverticulitis, SBO, and internal hernia.  Low suspicion for ACS given reassuring EKG and  nonelevated troponins obtained over 2 hours.  CT Abdo pelvis shows no evidence of diverticulitis, SBO, pancreatitis, cholecystitis, pyelonephritis, or other acute intra-abdominal pathology.  Additional findings consistent with possible GERD.  UA does not appear infected and overall presentation is not consistent with cystitis or pyelonephritis.  Hepatic function panel is unremarkable and shows no evidence of acute cholestasis.  BMP shows mild hypokalemia with a K of 3 with otherwise no significant electrolyte or metabolic derangements.  Covid PCR sent.  On my reassessment patient is napping in bed but easily awakens and when asked how she is feeling states "good".  On abdominal reassessment her abdomen is soft and nontender throughout.  Given stable vital signs with reassuring exam and work-up patient is safe for discharge with continued outpatient work-up and follow-up.  Patient discharged stable condition.  Strict return precautions provided in writing.  ____________________________________________   FINAL CLINICAL IMPRESSION(S) / ED DIAGNOSES  Final diagnoses:  Weakness  Abdominal pain, unspecified abdominal location  Hypokalemia  Atherosclerosis    Medications  potassium chloride SA (KLOR-CON) CR tablet 40 mEq (40 mEq Oral Given 06/29/20 1649)  iohexol (OMNIPAQUE) 300 MG/ML solution 100 mL (100 mLs Intravenous Contrast Given 06/29/20 1621)     ED Discharge Orders    None       Note:  This document was prepared using Dragon voice recognition software and may include unintentional dictation errors.   Gilles ChiquitoSmith, Bird Tailor P, MD 06/29/20 1753

## 2020-11-19 ENCOUNTER — Ambulatory Visit: Payer: Medicare Other | Admitting: Occupational Therapy

## 2020-11-30 ENCOUNTER — Encounter: Payer: Self-pay | Admitting: Occupational Therapy

## 2020-11-30 ENCOUNTER — Ambulatory Visit: Payer: Medicare Other | Attending: Orthopedic Surgery | Admitting: Occupational Therapy

## 2020-11-30 ENCOUNTER — Other Ambulatory Visit: Payer: Self-pay

## 2020-11-30 DIAGNOSIS — M25642 Stiffness of left hand, not elsewhere classified: Secondary | ICD-10-CM | POA: Diagnosis present

## 2020-11-30 DIAGNOSIS — M6281 Muscle weakness (generalized): Secondary | ICD-10-CM | POA: Insufficient documentation

## 2020-11-30 NOTE — Therapy (Signed)
[facility] [address] [place], [address] Phone: [phone]   Fax:  [phone]  Occupational Therapy Evaluation  Patient Details  Name: Melody Stone MRN: [id number] Date of Birth: May 04, 1951 Referring Provider (OT): Baldwin Jamaica   Encounter Date: 11/30/2020   OT End of Session - 11/30/20 1146    Visit Number 1    Number of Visits 10    Date for OT Re-Evaluation 01/04/21    OT Start Time 1001    OT Stop Time 1054    OT Time Calculation (min) 53 min    Activity Tolerance Patient tolerated treatment well    Behavior During Therapy [facility] for tasks assessed/performed           Past Medical History:  Diagnosis Date  . Anemia   . Aortic insufficiency    a. 10/2015 Echo: Nl LV fxn, mild to moderate AI; b. 02/2017 Echo: EF 60-65%, no rwma, mild AI.  Marland Kitchen Chest pain    a. 10/2015 Ex MV Gavin Potters): EF 69%, no ischemia; b. 01/2017 Lexiscan MV: EF 45%, no ischemia/infarct-->Low risk.  . Dementia (HCC)   . Dyspnea on exertion   . Hepatitis C   . HLD (hyperlipidemia)   . Hypertension   . Mental retardation   . Reflux esophagitis     Past Surgical History:  Procedure Laterality Date  . ABDOMINAL HYSTERECTOMY    . APPENDECTOMY      There were no vitals filed for this visit.   Subjective Assessment - 11/30/20 1135    Subjective  Pt denies pain -and has aid this date with her from group home    Pertinent History Melody Stone is a 70 y.o. female that  sustaining a left fifth proximal phalanx fracture on 08/19/2020. Patient was  seen by ortho at [facility] clinic several times. Patient is a resident at [facility]. She presents with an aide today.    History limited due to patient's mentation, but she seems to be in good spirits. She indicates she is having no significant pain. Her aide states that she has been combative with other residents in the past- and was refer by ortho to OT /hand therapy    Patient  Stated Goals TO use in choosing her clothes, help make bed and do laundry    Currently in Pain? No/denies   Pt history of mental retardation- no able to do pain scale for faces            [facility] OT Assessment - 11/30/20 0001      Assessment   Medical Diagnosis L 5th prox phalanges displace fx    Referring Provider (OT) Baldwin Jamaica    Onset Date/Surgical Date 08/19/20    Hand Dominance Right      Home  Environment   Lives With --   Group home     Prior Function   Vocation On disability    Leisure Watch tv, sits in reclinder, help with laundery and make bed -      Left Hand AROM   L Little  MCP 0-90 80 Degrees   0 extention   L Little PIP 0-100 90 Degrees   keep at 90 , no AROM ext, PROM ext -40   L Little DIP 0-70 60 Degrees             Done moist heat with pt and used LMB splint - for PIP extnetion -  Tolerate  only 2 min at time-  Done graston tool nr 2 - volar 5th massage done - brushing and then PROM and PIP extention stretch on table  By OT  HEP review with pt and caregiver at group home  Heat - soak warm water - 3-4 x day Ed caregiver on use of LMB splint for PIP extention - precautions And only on for 2 min at time Massage to volar 5th digit- 2 min  And pt to do PROM on table to PIP extention - several times during day Use book , tray or cutting board -for flat surface on lap              OT Education - 11/30/20 1146    Education Details findings of eval and HEP    Person(s) Educated Patient    Methods Explanation;Demonstration;Tactile cues;Verbal cues;Handout    Comprehension Verbal cues required;Returned demonstration;Verbalized understanding               OT Long Term Goals - 11/30/20 1153      OT LONG TERM GOAL #1   Title Dealer understanding of assisting pt with homeprogram and splint wearing to decrease flexor contracture at L 5th PIP    Baseline no knowledge- flexor contracture 90 degrees at PIP -and PROM to -40 in session     Time 3    Period Weeks    Status New    Target Date 12/21/20      OT LONG TERM GOAL #2   Title PIP extentionat L 5th  increase to more than 50 degrees to increase abilitiy to pick up large object    Baseline L 5th PIP flexor contracture at 90 - PROM able to get to -40    Time 5    Period Weeks    Status New    Target Date 01/04/21                 Plan - 11/30/20 1148    Clinical Impression Statement Pt is more than 3 months out from L 5th proximal phalanges displaced fx - pt arrive with flexor contracture of 90 degrees at PIP- can do PROM extention to -40 , MC extnetion WNL at 0, flexion 80 degrees- pt has hx of mental ratardation -and per caregive at group home also beginning of demetia- she needs assistance for ADL's , eats with R hand wit spoon -and she do help with choosing her clothes, making bed and doing laundry - did ed pt and caregiver on HEP for LMB splint use several times during day, massage and PROM - will assess if pt appropirate for cast for night time- and being R hand dominant and having assistance with ADL's - cont to assess best course for 5th flexor contracture/ displaced fx  - and education of staff at [employer]    OT Occupational Profile and History Problem Focused Assessment - Including review of records relating to presenting problem    Occupational performance deficits (Please refer to evaluation for details): ADL's;Play;Leisure    Body Structure / Function / Physical Skills Flexibility;ROM;UE functional use;Strength    Rehab Potential Poor    Clinical Decision Making Limited treatment options, no task modification necessary    Comorbidities Affecting Occupational Performance: May have comorbidities impacting occupational performance   cognitive status, was displaced fx   Modification or Assistance to Complete Evaluation  No modification of tasks or assist necessary to complete eval    OT Frequency 2x / week  OT Duration --   5 wks   OT  Treatment/Interventions Manual Therapy;Passive range of motion;Paraffin;Moist Heat;Therapeutic exercise;Splinting;Patient/family education    Consulted and Agree with Plan of Care Patient;Family member/caregiver           Patient will benefit from skilled therapeutic intervention in order to improve the following deficits and impairments:   Body Structure / Function / Physical Skills: Flexibility,ROM,UE functional use,Strength       Visit Diagnosis: Stiffness of left hand, not elsewhere classified - Plan: Ot plan of care cert/re-cert  Muscle weakness (generalized) - Plan: Ot plan of care cert/re-cert    Problem List Patient Active Problem List   Diagnosis Date Noted  . Dysphagia, oropharyngeal 07/08/2016  . Lung nodule, solitary 03/30/2016  . Moderate aortic insufficiency 03/30/2016  . HCV (hepatitis C virus) 02/19/2016  . HLD (hyperlipidemia) 02/19/2016  . BP (high blood pressure) 02/19/2016  . Intellectual disability 02/19/2016  . Esophagitis, reflux 02/19/2016  . Breathlessness on exertion 10/27/2015    Oletta Cohn OTR/L,CLT 11/30/2020, 11:58 AM   [facility] SPORTS [facility]ICINE [address], [place], [address] Phone: [phone]   Fax:  [phone]  Name: Melody Stone MRN: [id number] Date of Birth: [birth year]

## 2020-11-30 NOTE — Patient Instructions (Signed)
Heat - soak warm water - 3-4 x day Ed care give on use of LMB splint for PIP extention - precautions And only on for 2 min  Massage to volar 5th digit- 2 min  And pt to do PROM on table to PIP extention - several times during day

## 2020-12-03 ENCOUNTER — Ambulatory Visit: Payer: Medicare Other | Admitting: Occupational Therapy

## 2020-12-08 ENCOUNTER — Ambulatory Visit: Payer: Medicare Other | Admitting: Occupational Therapy

## 2020-12-08 ENCOUNTER — Other Ambulatory Visit: Payer: Self-pay

## 2020-12-08 DIAGNOSIS — M25642 Stiffness of left hand, not elsewhere classified: Secondary | ICD-10-CM | POA: Diagnosis not present

## 2020-12-08 DIAGNOSIS — M6281 Muscle weakness (generalized): Secondary | ICD-10-CM

## 2020-12-08 NOTE — Therapy (Signed)
[place] [facility] PHYS[facility] AND SPORTS MEDICINE [address], [place], [address] Phone: [phone]   Fax:  [phone]  Occupational Therapy Treatment  Patient Details  Name: Melody Stone MRN: [id number] Date of Birth: [birth year] Referring Provider (OT): Baldwin Jamaica   Encounter Date: 12/08/2020   OT End of Session - 12/08/20 1128    Visit Number 2    Number of Visits 10    Date for OT Re-Evaluation 01/04/21    OT Start Time 1118    OT Stop Time 1151    OT Time Calculation (min) 33 min    Activity Tolerance Patient tolerated treatment well    Behavior During Therapy [facility] for tasks assessed/performed           Past Medical History:  Diagnosis Date  . Anemia   . Aortic insufficiency    a. 10/2015 Echo: Nl LV fxn, mild to moderate AI; b. 02/2017 Echo: EF 60-65%, no rwma, mild AI.  Marland Kitchen Chest pain    a. 10/2015 Ex MV Gavin Potters): EF 69%, no ischemia; b. 01/2017 Lexiscan MV: EF 45%, no ischemia/infarct-->Low risk.  . Dementia (HCC)   . Dyspnea on exertion   . Hepatitis C   . HLD (hyperlipidemia)   . Hypertension   . Mental retardation   . Reflux esophagitis     Past Surgical History:  Procedure Laterality Date  . ABDOMINAL HYSTERECTOMY    . APPENDECTOMY      There were no vitals filed for this visit.   Subjective Assessment - 12/08/20 1127    Subjective  Pt denies pain -and has aid this date with her from group home    Pertinent History Melody Stone is a 70 y.o. female that  sustaining a left fifth proximal phalanx fracture on 08/19/2020. Patient was  seen by ortho at [facility] clinic several times. Patient is a resident at [facility]. She presents with an aide today.    History limited due to patient's mentation, but she seems to be in good spirits. She indicates she is having no significant pain. Her aide states that she has been combative with other residents in the past- and was refer by ortho to OT /hand therapy    Patient  Stated Goals TO use in choosing her clothes, help make bed and do laundry    Currently in Pain? No/denies              [facility] OT Assessment - 12/08/20 0001      Left Hand AROM   L Little  MCP 0-90 70 Degrees    L Little PIP 0-100 90 Degrees   keep at -60          Caregiver that brought her today - not in the home with pt- and stayed in waiting room         OT Treatments/Exercises (OP) - 12/08/20 0001      Moist Heat Therapy   Number Minutes Moist Heat 8 Minutes    Moist Heat Location Hand   prior to LMB splint and soft tissue/ROM           Done moist heat with pt and used LMB splint - for PIP extnetion - 3 x 2 min - supervision every time to avoid pressure areas  Tolerate only 2 min at time Done graston tool nr 2 - brushing done  - volar 5th and palm  -  mini massager use on and then PROM and  PIP extention stretch on table  By OT  HEP review with pt and caregiver  Heat - soak warm water - 3-4 x day Ed caregiver on use of LMB splint for PIP extention - precautions last visit And only on for 2 min at time Massage to volar 5th digit- 2 min  And pt to do PROM on table to PIP extention - several times during day Use book , tray or cutting board -for flat surface on lap several time during day  can do PROM for MC flexion of 5th - but focus mostly PIP extention PROM at 5th  Pt not able to follow direction to do AROM ext or flexion- or when attempted to do rubber band for thumb and 5th digit extention          OT Education - 12/08/20 1128    Education Details progress and HEP    Person(s) Educated Patient    Methods Explanation;Demonstration;Tactile cues;Verbal cues;Handout    Comprehension Verbal cues required;Returned demonstration;Verbalized understanding               OT Long Term Goals - 11/30/20 1153      OT LONG TERM GOAL #1   Title Dealer understanding of assisting pt with homeprogram and splint wearing to decrease flexor contracture  at L 5th PIP    Baseline no knowledge- flexor contracture 90 degrees at PIP -and PROM to -40 in session    Time 3    Period Weeks    Status New    Target Date 12/21/20      OT LONG TERM GOAL #2   Title PIP extentionat L 5th  increase to more than 50 degrees to increase abilitiy to pick up large object    Baseline L 5th PIP flexor contracture at 90 - PROM able to get to -40    Time 5    Period Weeks    Status New    Target Date 01/04/21                 Plan - 12/08/20 1129    Clinical Impression Statement Pt is more than 3 months out from L 5th proximal phalanges displaced fx - pt arrive with flexor contracture of -70 to -90 degrees at PIP- can do PROM extention to -30 in session this date after heat and LMB splint use - MC extnetion WNL at 0, flexion 80 degrees- pt has hx of mental ratardation -and per caregive at group home also beginning of demetia- she needs assistance for ADL's , eats with R hand wit spoon -and she do help with choosing her clothes, making bed and doing laundry - did review again with caregiver that brought her about HEP for LMB splint use several times during day, massage and PROM - will assess if pt appropirate for cast for night time- but fearfull for pressure area ( pt not follow directions) -  she is  R hand dominant and having assistance with ADL's - cont to assess best course for 5th flexor contracture/ displaced fx  - and education of staff at [employer]    OT Occupational Profile and History Problem Focused Assessment - Including review of records relating to presenting problem    Occupational performance deficits (Please refer to evaluation for details): ADL's;Play;Leisure    Body Structure / Function / Physical Skills Flexibility;ROM;UE functional use;Strength    Rehab Potential Poor    Clinical Decision Making Limited treatment options, no task modification necessary  Comorbidities Affecting Occupational Performance: May have comorbidities impacting  occupational performance    Modification or Assistance to Complete Evaluation  No modification of tasks or assist necessary to complete eval    OT Frequency 2x / week    OT Duration 4 weeks    OT Treatment/Interventions Manual Therapy;Passive range of motion;Paraffin;Moist Heat;Therapeutic exercise;Splinting;Patient/family education    Consulted and Agree with Plan of Care Patient;Family member/caregiver           Patient will benefit from skilled therapeutic intervention in order to improve the following deficits and impairments:   Body Structure / Function / Physical Skills: Flexibility,ROM,UE functional use,Strength       Visit Diagnosis: Stiffness of left hand, not elsewhere classified  Muscle weakness (generalized)    Problem List Patient Active Problem List   Diagnosis Date Noted  . Dysphagia, oropharyngeal 07/08/2016  . Lung nodule, solitary 03/30/2016  . Moderate aortic insufficiency 03/30/2016  . HCV (hepatitis C virus) 02/19/2016  . HLD (hyperlipidemia) 02/19/2016  . BP (high blood pressure) 02/19/2016  . Intellectual disability 02/19/2016  . Esophagitis, reflux 02/19/2016  . Breathlessness on exertion 10/27/2015    Oletta Cohn OTR/L,CLT 12/08/2020, 11:55 AM  [facility] PHYSICAL AND SPORTS MEDICINE [address], [place], [address] Phone: [phone]   Fax:  [phone]  Name: Melody Stone MRN: [id number] Date of Birth: 24-Nov-1950

## 2020-12-10 ENCOUNTER — Ambulatory Visit: Payer: Medicare Other | Admitting: Occupational Therapy

## 2020-12-15 ENCOUNTER — Encounter: Payer: Self-pay | Admitting: Occupational Therapy

## 2020-12-15 ENCOUNTER — Ambulatory Visit: Payer: Medicare Other | Admitting: Occupational Therapy

## 2020-12-15 ENCOUNTER — Other Ambulatory Visit: Payer: Self-pay

## 2020-12-15 DIAGNOSIS — M25642 Stiffness of left hand, not elsewhere classified: Secondary | ICD-10-CM

## 2020-12-15 DIAGNOSIS — M6281 Muscle weakness (generalized): Secondary | ICD-10-CM

## 2020-12-15 NOTE — Therapy (Signed)
[place] [facility] [address] [place], [address] Phone: [phone]   Fax:  [phone]  Occupational Therapy Treatment  Patient Details  Name: Melody Stone MRN: [id number] Date of Birth: September 14, 1950 Referring Provider (OT): Baldwin Jamaica   Encounter Date: 12/15/2020   OT End of Session - 12/15/20 2213    Visit Number 3    Number of Visits 10    Date for OT Re-Evaluation 01/04/21    OT Start Time 0945    OT Stop Time 1030    OT Time Calculation (min) 45 min    Activity Tolerance Patient tolerated treatment well    Behavior During Therapy [facility] for tasks assessed/performed           Past Medical History:  Diagnosis Date  . Anemia   . Aortic insufficiency    a. 10/2015 Echo: Nl LV fxn, mild to moderate AI; b. 02/2017 Echo: EF 60-65%, no rwma, mild AI.  Marland Kitchen Chest pain    a. 10/2015 Ex MV Gavin Potters): EF 69%, no ischemia; b. 01/2017 Lexiscan MV: EF 45%, no ischemia/infarct-->Low risk.  . Dementia (HCC)   . Dyspnea on exertion   . Hepatitis C   . HLD (hyperlipidemia)   . Hypertension   . Mental retardation   . Reflux esophagitis     Past Surgical History:  Procedure Laterality Date  . ABDOMINAL HYSTERECTOMY    . APPENDECTOMY      There were no vitals filed for this visit.   Subjective Assessment - 12/15/20 2213    Subjective  Pt smiling on arrival, no pain. Caregiver from group home present with patient today, [name].    Pertinent History Melody Stone is a 70 y.o. female that  sustaining a left fifth proximal phalanx fracture on 08/19/2020. Patient was  seen by ortho at [facility] clinic several times. Patient is a resident at [facility]. She presents with an aide today.    History limited due to patient's mentation, but she seems to be in good spirits. She indicates she is having no significant pain. Her aide states that she has been combative with other residents in the past- and was refer by ortho  to OT /hand therapy    Patient Stated Goals TO use in choosing her clothes, help make bed and do laundry    Currently in Pain? No/denies    Multiple Pain Sites No          Moist heat to L hand for 5 mins prior to therapeutic exercise to increase blood flow, increase ROM.  Manual therapy with soft tissue mobs to left hand, graston nr 2 tool to volar 5th digit and palm with brushing strokes, use of mini massager.    Therapeutic exercise:  Use of LMB splint for PIP extension for 3 trials of 2 mins for prolonged stretching for extension.   PROM of digits with PIP stretch by therapist and also on table.  Grasp and release patterns for large barrel surface object, cues for extension.  Performing hand games with clapping hands together, high 5s and matching therapist hands with fingers extended.   Review of HEP with patient and caregiver.   Response to tx: Patient smiling today and often stating, "I'm doing good", pt has difficulty following directions but responds well to mirroring technique from therapist for exercises.  Pt engaged this date during therapy, no issues.  Pt responding well to manual techniques for soft tissue.  Continue  OT to maximize safety and independence in necessary daily tasks.                          OT Education - 12/15/20 2213    Education Details progress and HEP    Person(s) Educated Patient    Methods Explanation;Demonstration;Tactile cues;Verbal cues;Handout    Comprehension Verbal cues required;Returned demonstration;Verbalized understanding               OT Long Term Goals - 11/30/20 1153      OT LONG TERM GOAL #1   Title Dealer understanding of assisting pt with homeprogram and splint wearing to decrease flexor contracture at L 5th PIP    Baseline no knowledge- flexor contracture 90 degrees at PIP -and PROM to -40 in session    Time 3    Period Weeks    Status New    Target Date 12/21/20      OT LONG TERM GOAL #2    Title PIP extentionat L 5th  increase to more than 50 degrees to increase abilitiy to pick up large object    Baseline L 5th PIP flexor contracture at 90 - PROM able to get to -40    Time 5    Period Weeks    Status New    Target Date 01/04/21                 Plan - 12/15/20 2213    Clinical Impression Statement Patient smiling today and often stating, "I'm doing good", pt has difficulty following directions but responds well to mirroring technique from therapist for exercises.  Pt engaged this date during therapy, no issues.  Pt responding well to manual techniques for soft tissue.  Continue OT to maximize safety and independence in necessary daily tasks.    OT Occupational Profile and History Problem Focused Assessment - Including review of records relating to presenting problem    Occupational performance deficits (Please refer to evaluation for details): ADL's;Play;Leisure    Body Structure / Function / Physical Skills Flexibility;ROM;UE functional use;Strength    Rehab Potential Poor    Clinical Decision Making Limited treatment options, no task modification necessary    Comorbidities Affecting Occupational Performance: May have comorbidities impacting occupational performance    Modification or Assistance to Complete Evaluation  No modification of tasks or assist necessary to complete eval    OT Frequency 2x / week    OT Duration 4 weeks    OT Treatment/Interventions Manual Therapy;Passive range of motion;Paraffin;Moist Heat;Therapeutic exercise;Splinting;Patient/family education    Consulted and Agree with Plan of Care Patient;Family member/caregiver           Patient will benefit from skilled therapeutic intervention in order to improve the following deficits and impairments:   Body Structure / Function / Physical Skills: Flexibility,ROM,UE functional use,Strength       Visit Diagnosis: Stiffness of left hand, not elsewhere classified  Muscle weakness  (generalized)    Problem List Patient Active Problem List   Diagnosis Date Noted  . Dysphagia, oropharyngeal 07/08/2016  . Lung nodule, solitary 03/30/2016  . Moderate aortic insufficiency 03/30/2016  . HCV (hepatitis C virus) 02/19/2016  . HLD (hyperlipidemia) 02/19/2016  . BP (high blood pressure) 02/19/2016  . Intellectual disability 02/19/2016  . Esophagitis, reflux 02/19/2016  . Breathlessness on exertion 10/27/2015   Lillian Ballester T Corrinna Karapetyan, OTR/L, CLT  Bernice Mcauliffe 12/16/2020, 10:28 PM  [place] [facility] PHYSICAL [facility] SPORTS  MEDICINE [address], [place], [address] Phone: [phone]   Fax:  [phone]  Name: Melody Stone MRN: [id number] Date of Birth: [birth year]

## 2020-12-17 ENCOUNTER — Ambulatory Visit: Payer: Medicare Other | Admitting: Occupational Therapy

## 2020-12-17 ENCOUNTER — Other Ambulatory Visit: Payer: Self-pay

## 2020-12-17 ENCOUNTER — Encounter: Payer: Self-pay | Admitting: Occupational Therapy

## 2020-12-17 DIAGNOSIS — M25642 Stiffness of left hand, not elsewhere classified: Secondary | ICD-10-CM

## 2020-12-17 DIAGNOSIS — M6281 Muscle weakness (generalized): Secondary | ICD-10-CM

## 2020-12-17 NOTE — Therapy (Signed)
[place] [facility] PHYSICAL [facility] SPORTS MEDICINE [address], [place], [address] Phone: [phone]   Fax:  [phone]  Occupational Therapy Treatment  Patient Details  Name: Melody Stone MRN: [id number] Date of Birth: 01-17-1951 Referring Provider (OT): Baldwin Jamaica   Encounter Date: 12/17/2020   OT End of Session - 12/17/20 1319    Visit Number 4    Number of Visits 10    Date for OT Re-Evaluation 01/04/21    OT Start Time 1308    OT Stop Time 1346    OT Time Calculation (min) 38 min    Activity Tolerance Patient tolerated treatment well    Behavior During Therapy [facility] for tasks assessed/performed           Past Medical History:  Diagnosis Date  . Anemia   . Aortic insufficiency    a. 10/2015 Echo: Nl LV fxn, mild to moderate AI; b. 02/2017 Echo: EF 60-65%, no rwma, mild AI.  Marland Kitchen Chest pain    a. 10/2015 Ex MV Gavin Potters): EF 69%, no ischemia; b. 01/2017 Lexiscan MV: EF 45%, no ischemia/infarct-->Low risk.  . Dementia (HCC)   . Dyspnea on exertion   . Hepatitis C   . HLD (hyperlipidemia)   . Hypertension   . Mental retardation   . Reflux esophagitis     Past Surgical History:  Procedure Laterality Date  . ABDOMINAL HYSTERECTOMY    . APPENDECTOMY      There were no vitals filed for this visit.   Subjective Assessment - 12/17/20 1317    Subjective  Pt ready to work, willing to try fluidotherapy today before exercises.    Pertinent History Melody Stone is a 70 y.o. female that  sustaining a left fifth proximal phalanx fracture on 08/19/2020. Patient was  seen by ortho at [facility] clinic several times. Patient is a resident at [facility]. She presents with an aide today.    History limited due to patient's mentation, but she seems to be in good spirits. She indicates she is having no significant pain. Her aide states that she has been combative with other residents in the past- and was refer by ortho to OT /hand therapy     Patient Stated Goals TO use in choosing her clothes, help make bed and do laundry    Currently in Pain? Yes    Pain Score 2     Pain Location Hand    Pain Orientation Left    Pain Descriptors / Indicators Aching    Pain Onset Today    Pain Frequency Intermittent    Multiple Pain Sites No          Pt states, "I like it!"  Fluidotherapy to left UE hand and wrist for 10 mins to increase ROM, decrease pain and prepare UE for therapeutic exercise.    Therapeutic Exercise:   Pt seen for PROM and prolonged stretching to 5th digit, small finger at PIP joint for extension.  AAROM with therapist guiding and demonstration.  4 trials of stretch with Left LMB splint to 5th finger at PIP for 2 mins each time, AAROM after each trial with splint removed.  Tabletop stretches with hand flat on table and using opposite hand to provide pressure for stretch, pt requires hand over hand assist.  Grasp and release patterns with focus on extension of digits with release.  Attempted light blue theraputty for 5th digit extension, pt requires mod to max assist.  Response to tx: Pt engaged in therapy these last 2 visits and open to trying fluidotherapy this date versus heat pack.  She really enjoyed the fluido and could work on active movement of the hand while in fluido, requires cues from therapist.  Pt remains limited with carryover at home due to poor cognitive processing regarding exercises.  Staff have been available and present during sessions and will encourage and assist as needed.  Continue to work towards goals in plan of care to prevent further contractures and to improve functional hand use for daily tasks.                      OT Education - 12/17/20 1318    Education Details HEP    Person(s) Educated Patient    Methods Explanation;Demonstration;Tactile cues;Verbal cues;Handout    Comprehension Verbal cues required;Returned demonstration;Verbalized understanding                OT Long Term Goals - 11/30/20 1153      OT LONG TERM GOAL #1   Title Dealer understanding of assisting pt with homeprogram and splint wearing to decrease flexor contracture at L 5th PIP    Baseline no knowledge- flexor contracture 90 degrees at PIP -and PROM to -40 in session    Time 3    Period Weeks    Status New    Target Date 12/21/20      OT LONG TERM GOAL #2   Title PIP extentionat L 5th  increase to more than 50 degrees to increase abilitiy to pick up large object    Baseline L 5th PIP flexor contracture at 90 - PROM able to get to -40    Time 5    Period Weeks    Status New    Target Date 01/04/21                 Plan - 12/17/20 1319    Clinical Impression Statement Pt engaged in therapy these last 2 visits and open to trying fluidotherapy this date versus heat pack.  She really enjoyed the fluido and could work on active movement of the hand while in fluido, requires cues from therapist.  Pt remains limited with carryover at home due to poor cognitive processing regarding exercises.  Staff have been available and present during sessions and will encourage and assist as needed.  Continue to work towards goals in plan of care to prevent further contractures and to improve functional hand use for daily tasks.    OT Occupational Profile and History Problem Focused Assessment - Including review of records relating to presenting problem    Occupational performance deficits (Please refer to evaluation for details): ADL's;Play;Leisure    Body Structure / Function / Physical Skills Flexibility;ROM;UE functional use;Strength    Rehab Potential Poor    Clinical Decision Making Limited treatment options, no task modification necessary    Comorbidities Affecting Occupational Performance: May have comorbidities impacting occupational performance    Modification or Assistance to Complete Evaluation  No modification of tasks or assist necessary to complete eval    OT Frequency  2x / week    OT Duration 4 weeks    OT Treatment/Interventions Manual Therapy;Passive range of motion;Paraffin;Moist Heat;Therapeutic exercise;Splinting;Patient/family education    Consulted and Agree with Plan of Care Patient;Family member/caregiver           Patient will benefit from skilled therapeutic intervention in order to improve the following deficits and  impairments:   Body Structure / Function / Physical Skills: Flexibility,ROM,UE functional use,Strength       Visit Diagnosis: Muscle weakness (generalized)  Stiffness of left hand, not elsewhere classified    Problem List Patient Active Problem List   Diagnosis Date Noted  . Dysphagia, oropharyngeal 07/08/2016  . Lung nodule, solitary 03/30/2016  . Moderate aortic insufficiency 03/30/2016  . HCV (hepatitis C virus) 02/19/2016  . HLD (hyperlipidemia) 02/19/2016  . BP (high blood pressure) 02/19/2016  . Intellectual disability 02/19/2016  . Esophagitis, reflux 02/19/2016  . Breathlessness on exertion 10/27/2015   Saretta Dahlem T Fujie Dickison, OTR/L, CLT  Lizza Huffaker 12/17/2020, 2:21 PM  [place] [facility] PHYSICAL [facility] SPORTS MEDICINE [address] [place], [address] Phone: [phone]   Fax:  [phone]  Name: Yasmyn Bellisario MRN: [id number] Date of Birth: 12/19/1950

## 2020-12-25 ENCOUNTER — Ambulatory Visit: Payer: Medicare Other | Admitting: Occupational Therapy

## 2021-01-29 ENCOUNTER — Ambulatory Visit
Admission: EM | Admit: 2021-01-29 | Discharge: 2021-01-29 | Disposition: A | Discharge status: Home / Self Care | Payer: Medicare Other

## 2021-01-29 ENCOUNTER — Other Ambulatory Visit: Payer: Self-pay

## 2021-01-29 DIAGNOSIS — R030 Elevated blood-pressure reading, without diagnosis of hypertension: Secondary | ICD-10-CM

## 2021-01-29 NOTE — ED Provider Notes (Signed)
[facility]    CSN: [id number] Arrival date & time: 01/29/21  1635      History   Chief Complaint Chief Complaint  Patient presents with  . Eye Pain    Bilateral      HPI Melody Stone is a 70 y.o. female.   Melody Stone presents with a caregiver with complaints of rubbing her eyes and being tearful this afternoon. Since being with her current caregiver, she has not noted any of these concerns or complaints. She states that Melody Stone appears to be normal behavior and self, in no acute pain. No eye redness or drainage. No URI symptoms noted. She tested negative for covid-19 before arrival. She does take two blood pressure medication, and one which has an evening dose she is still to take. She does not get her blood pressure checked regularly. Scheduled to see her PCP on 6/17. History of mental retardation and dementia.    ROS per HPI, negative if not otherwise mentioned.      Past Medical History:  Diagnosis Date  . Anemia   . Aortic insufficiency    a. 10/2015 Echo: Nl LV fxn, mild to moderate AI; b. 02/2017 Echo: EF 60-65%, no rwma, mild AI.  Marland Kitchen Chest pain    a. 10/2015 Ex MV Gavin Potters): EF 69%, no ischemia; b. 01/2017 Lexiscan MV: EF 45%, no ischemia/infarct-->Low risk.  . Dementia (HCC)   . Dyspnea on exertion   . Hepatitis C   . HLD (hyperlipidemia)   . Hypertension   . Mental retardation   . Reflux esophagitis     Patient Active Problem List   Diagnosis Date Noted  . Dysphagia, oropharyngeal 07/08/2016  . Lung nodule, solitary 03/30/2016  . Moderate aortic insufficiency 03/30/2016  . HCV (hepatitis C virus) 02/19/2016  . HLD (hyperlipidemia) 02/19/2016  . BP (high blood pressure) 02/19/2016  . Intellectual disability 02/19/2016  . Esophagitis, reflux 02/19/2016  . Breathlessness on exertion 10/27/2015    Past Surgical History:  Procedure Laterality Date  . ABDOMINAL HYSTERECTOMY    . APPENDECTOMY      OB History   No obstetric history on  file.      Home Medications    Prior to Admission medications   Medication Sig Start Date End Date Taking? Authorizing Provider  Cholecalciferol (VITAMIN D3) 1000 units CAPS Take 2,000 Units by mouth daily.    Yes [provider]  clonazePAM (KLONOPIN) 0.5 MG tablet Take 0.25-0.5 mg by mouth See admin instructions. Take  tablet (0.25mg ) by mouth every morning and 1 tablet (1mg ) by mouth every evening   Yes [provider]  docusate sodium (COLACE) 100 MG capsule Take 100 mg by mouth 2 (two) times daily.   Yes [provider]  donepezil (ARICEPT) 5 MG tablet Take 5 mg by mouth daily. 02/28/20  Yes [provider]  ibuprofen (ADVIL,MOTRIN) 200 MG tablet Take 600 mg by mouth 2 (two) times daily.   Yes [provider]  losartan (COZAAR) 50 MG tablet Take 1 tablet by mouth daily. 12/20/20  Yes [provider]  memantine (NAMENDA) 10 MG tablet Take 10 mg by mouth 2 (two) times daily. 02/21/20  Yes [provider]  polyethylene glycol powder (GLYCOLAX/MIRALAX) 17 GM/SCOOP powder Take by mouth.   Yes [provider]  potassium chloride 20 MEQ/15ML (10%) SOLN SMARTSIG:Milliliter(s) By Mouth 12/30/20  Yes [provider]  senna (SENOKOT) 8.6 MG tablet Take 2 tablets by mouth daily.   Yes  [provider]  venlafaxine XR (EFFEXOR-XR) 37.5 MG 24 hr capsule Take 37.5 mg by mouth daily. 02/21/20  Yes [provider]  verapamil (CALAN-SR) 180 MG CR tablet Take 180 mg by mouth 2 (two) times daily.    Yes [provider]  amoxicillin-clavulanate (AUGMENTIN) 875-125 MG tablet Take 1 tablet by mouth 2 (two) times daily. Patient not taking: No sig reported 04/14/20   Melody Sams, DO  hydrochlorothiazide (HYDRODIURIL) 25 MG tablet Take 25 mg by mouth daily. 02/21/20   [provider]  loratadine (CLARITIN) 10 MG tablet Take by mouth. 06/25/19 06/24/20  [provider]  PARoxetine (PAXIL) 30 MG  tablet Take 30 mg by mouth at bedtime.   04/14/20  [provider]    Family History Family History  Problem Relation Age of Onset  . Heart attack Mother        CABG in her 62's  . Cancer Mother   . CAD Brother        9  . Hypertension Brother        81    Social History Social History   Tobacco Use  . Smoking status: Never Smoker  . Smokeless tobacco: Never Used  Vaping Use  . Vaping Use: Never used  Substance Use Topics  . Alcohol use: No  . Drug use: No     Allergies   Other   Review of Systems Review of Systems   Physical Exam Triage Vital Signs ED Triage Vitals [01/29/21 1741]  Enc Vitals Group     BP (!) 170/106     Pulse Rate 67     Resp 18     Temp 98 F (36.7 C)     Temp Source Oral     SpO2 100 %     Weight      Height      Head Circumference      Peak Flow      Pain Score      Pain Loc      Pain Edu?      Excl. in GC?    No data found.  Updated Vital Signs BP (!) 170/106 (BP Location: Left Arm)   Pulse 67   Temp 98 F (36.7 C) (Oral)   Resp 18   SpO2 100%   Visual Acuity Right Eye Distance:   Left Eye Distance:   Bilateral Distance:    Right Eye Near:   Left Eye Near:    Bilateral Near:     Physical Exam Constitutional:      General: She is not in acute distress.    Appearance: She is not ill-appearing or toxic-appearing.  HENT:     Head: Normocephalic and atraumatic.  Eyes:     General: Lids are normal.        Right eye: No discharge.        Left eye: No discharge.     Extraocular Movements: Extraocular movements intact.     Conjunctiva/sclera: Conjunctivae normal.     Pupils: Pupils are equal, round, and reactive to light.     Comments: Pupils pinpoint bilaterally  Cardiovascular:     Rate and Rhythm: Normal rate.  Pulmonary:     Effort: Pulmonary effort is normal.  Neurological:     Mental Status: She is alert. Mental status is at baseline.     Comments: At baseline per caregiver. She says  occasional words which do not always specifically relate to conversation, does  not respond to questions with verbal responses; minimal cooperation with exam; cooperates with caregiver with grasping hand and ambulating down hallway without any apparent difficulty      UC Treatments / Results  Labs (all labs ordered are listed, but only abnormal results are displayed) Labs Reviewed - No data to display  EKG   Radiology No results found.  Procedures Procedures (including critical care time)  Medications Ordered in UC Medications - No data to display  Initial Impression / Assessment and Plan / UC Course  I have reviewed the triage vital signs and the nursing notes.  Pertinent labs & imaging results that were available during my care of the patient were reviewed by me and considered in my medical decision making (see chart for details).     No acute findings on exam, which is limited by mental status, unfortunately. Eyes without acute findings. No evidence of URI symptoms. No new mental status changes. Elevated BP noted here today, is due for her verapamil still tonight. Recommended bp checks daily to determine if medication adjustments are needed. Return precautions provided. Patient caregiver verbalized understanding and agreeable to plan. Ambulatory out of clinic without difficulty.    Final Clinical Impressions(s) / UC Diagnoses   Final diagnoses:  Elevated blood pressure reading     Discharge Instructions     I do not see any obvious source of Sophiarose's symptoms earlier today.  Her blood pressure is elevated, which she has had before, and it appears she is due again tonight for another BP medication.  Please check her BP daily two hours after her meds and provide log to her PCP for BP recheck and management.  Go to the ER for any worsening of symptoms or evidence of stroke- weakness, facial droop, drooling   ED Prescriptions    None     PDMP not reviewed this  encounter.   Georgetta Haber, NP 01/29/21 [phone]

## 2021-01-29 NOTE — ED Triage Notes (Signed)
Patient presents to [facility] with caregiver. Caregiver states that she has been rubbing her eyes a lot. Had covid testing and was negative. Patient not verbal to give report.

## 2021-01-29 NOTE — Discharge Instructions (Signed)
I do not see any obvious source of Melody Stone's symptoms earlier today.  Her blood pressure is elevated, which she has had before, and it appears she is due again tonight for another BP medication.  Please check her BP daily two hours after her meds and provide log to her PCP for BP recheck and management.  Go to the ER for any worsening of symptoms or evidence of stroke- weakness, facial droop, drooling

## 2021-02-04 ENCOUNTER — Other Ambulatory Visit: Payer: Self-pay

## 2021-02-04 ENCOUNTER — Emergency Department: Payer: Medicare Other

## 2021-02-04 ENCOUNTER — Emergency Department
Admission: EM | Admit: 2021-02-04 | Discharge: 2021-02-04 | Disposition: A | Discharge status: Home / Self Care | Payer: Medicare Other | Attending: Emergency Medicine | Admitting: Emergency Medicine

## 2021-02-04 DIAGNOSIS — N939 Abnormal uterine and vaginal bleeding, unspecified: Secondary | ICD-10-CM | POA: Diagnosis not present

## 2021-02-04 DIAGNOSIS — F0390 Unspecified dementia without behavioral disturbance: Secondary | ICD-10-CM | POA: Insufficient documentation

## 2021-02-04 DIAGNOSIS — I10 Essential (primary) hypertension: Secondary | ICD-10-CM | POA: Insufficient documentation

## 2021-02-04 DIAGNOSIS — Z79899 Other long term (current) drug therapy: Secondary | ICD-10-CM | POA: Diagnosis not present

## 2021-02-04 DIAGNOSIS — R0789 Other chest pain: Secondary | ICD-10-CM

## 2021-02-04 DIAGNOSIS — R079 Chest pain, unspecified: Secondary | ICD-10-CM | POA: Diagnosis present

## 2021-02-04 DIAGNOSIS — K5900 Constipation, unspecified: Secondary | ICD-10-CM | POA: Diagnosis not present

## 2021-02-04 LAB — URINALYSIS, COMPLETE (UACMP) WITH MICROSCOPIC
Bacteria, UA: NONE SEEN
Bilirubin Urine: NEGATIVE
Glucose, UA: NEGATIVE mg/dL
Hgb urine dipstick: NEGATIVE
Ketones, ur: NEGATIVE mg/dL
Nitrite: NEGATIVE
Protein, ur: NEGATIVE mg/dL
Specific Gravity, Urine: 1.006 (ref 1.005–1.030)
pH: 6 (ref 5.0–8.0)

## 2021-02-04 LAB — BASIC METABOLIC PANEL
Anion gap: 9 (ref 5–15)
BUN: 17 mg/dL (ref 8–23)
CO2: 24 mmol/L (ref 22–32)
Calcium: 9.3 mg/dL (ref 8.9–10.3)
Chloride: 102 mmol/L (ref 98–111)
Creatinine, Ser: 0.73 mg/dL (ref 0.44–1.00)
GFR, Estimated: >60 mL/min (ref >60)
Glucose, Bld: 92 mg/dL (ref 70–99)
Potassium: 3.8 mmol/L (ref 3.5–5.1)
Sodium: 135 mmol/L (ref 135–145)

## 2021-02-04 LAB — CBC
HCT: 31.4 % — ABNORMAL LOW (ref 36.0–46.0)
Hemoglobin: 11.1 g/dL — ABNORMAL LOW (ref 12.0–15.0)
MCH: 30.2 pg (ref 26.0–34.0)
MCHC: 35.4 g/dL (ref 30.0–36.0)
MCV: 85.6 fL (ref 80.0–100.0)
Platelets: 216 10*3/uL (ref 150–400)
RBC: 3.67 MIL/uL — ABNORMAL LOW (ref 3.87–5.11)
RDW: 13.6 % (ref 11.5–15.5)
WBC: 8.8 10*3/uL (ref 4.0–10.5)
nRBC: 0 % (ref 0.0–0.2)

## 2021-02-04 LAB — PROTIME-INR
INR: 0.9 (ref 0.8–1.2)
Prothrombin Time: 11.9 seconds (ref 11.4–15.2)

## 2021-02-04 LAB — TROPONIN I (HIGH SENSITIVITY)
Troponin I (High Sensitivity): 4 ng/L (ref <18)
Troponin I (High Sensitivity): 5 ng/L (ref <18)

## 2021-02-04 MED ORDER — POLYETHYLENE GLYCOL 3350 17 G PO PACK: 17.0000 g | PACK | Freq: Every day | ORAL | 0 refills | Status: DC

## 2021-02-04 MED ORDER — SORBITOL 70 % SOLN
960.0000 mL | TOPICAL_OIL | Freq: Once | ORAL | Status: AC
  Administered 2021-02-04: 960 mL via RECTAL
  Filled 2021-02-04 (×2): qty 473

## 2021-02-04 MED ORDER — ERYTHROMYCIN 5 MG/GM OP OINT: 1.0000 "application " | TOPICAL_OINTMENT | Freq: Three times a day (TID) | OPHTHALMIC | 0 refills | Status: AC

## 2021-02-04 MED ORDER — KETOROLAC TROMETHAMINE 30 MG/ML IJ SOLN
15.0000 mg | Freq: Once | INTRAMUSCULAR | Status: AC
  Administered 2021-02-04: 15 mg via INTRAVENOUS
  Filled 2021-02-04: qty 1

## 2021-02-04 NOTE — Discharge Instructions (Addendum)
You were seen today for evaluation of chest pain and abdominal discomfort. Please use Miralax, 1 packet daily as sent to your pharmacy for the next 7 days. Follow up with primary care to determine if you need this on a longer term basis. You were also prescribed erythromycin ointment for your right eye. Please apply as prescribed. Follow up with OBGYN regarding vaginal bleeding that appears to be new, and should be evaluated in this age group. Return to the ER if there is any worsening prior to your follow up.

## 2021-02-04 NOTE — ED Provider Notes (Signed)
[facility] Emergency Department Provider Note  ____________________________________________   Event Date/Time   First MD Initiated Contact with Patient 02/04/21 1618     (approximate)  I have reviewed the triage vital signs and the nursing notes.   HISTORY  Chief Complaint Chest Pain  History significantly limited by the patient's well-documented history of intellectual disability.  HPI Melody Stone is a 70 y.o. female with past medical history significant for mental disability, aortic insufficiency, hypertension, hyperlipidemia, esophagitis, hepatitis C who reports to the emergency department for evaluation of chest pain.  She also reports associated abdominal pain.  She denies any vomiting, nausea, diarrhea or constipation, fever.  She denies any cough, shortness of breath, dizziness.         Past Medical History:  Diagnosis Date   Anemia    Aortic insufficiency    a. 10/2015 Echo: Nl LV fxn, mild to moderate AI; b. 02/2017 Echo: EF 60-65%, no rwma, mild AI.   Chest pain    a. 10/2015 Ex MV Gavin Potters): EF 69%, no ischemia; b. 01/2017 Lexiscan MV: EF 45%, no ischemia/infarct-->Low risk.   Dementia (HCC)    Dyspnea on exertion    Hepatitis C    HLD (hyperlipidemia)    Hypertension    Mental retardation    Reflux esophagitis     Patient Active Problem List   Diagnosis Date Noted   Dysphagia, oropharyngeal 07/08/2016   Lung nodule, solitary 03/30/2016   Moderate aortic insufficiency 03/30/2016   HCV (hepatitis C virus) 02/19/2016   HLD (hyperlipidemia) 02/19/2016   BP (high blood pressure) 02/19/2016   Intellectual disability 02/19/2016   Esophagitis, reflux 02/19/2016   Breathlessness on exertion 10/27/2015    Past Surgical History:  Procedure Laterality Date   ABDOMINAL HYSTERECTOMY     APPENDECTOMY      Prior to Admission medications   Medication Sig Start Date End Date Taking? Authorizing Provider  erythromycin ophthalmic  ointment Place 1 application into the right eye 3 (three) times daily for 7 days. 02/04/21 02/11/21 Yes Neera Teng, Ruben Gottron, PA  polyethylene glycol (MIRALAX) 17 g packet Take 17 g by mouth daily. 02/04/21  Yes Melody Chris, PA  amoxicillin-clavulanate (AUGMENTIN) 875-125 MG tablet Take 1 tablet by mouth 2 (two) times daily. Patient not taking: No sig reported 04/14/20   Tommie Sams, DO  Cholecalciferol (VITAMIN D3) 1000 units CAPS Take 2,000 Units by mouth daily.     [provider]  clonazePAM (KLONOPIN) 0.5 MG tablet Take 0.25-0.5 mg by mouth See admin instructions. Take  tablet (0.25mg ) by mouth every morning and 1 tablet (1mg ) by mouth every evening    [provider]  docusate sodium (COLACE) 100 MG capsule Take 100 mg by mouth 2 (two) times daily.    [provider]  donepezil (ARICEPT) 5 MG tablet Take 5 mg by mouth daily. 02/28/20   [provider]  hydrochlorothiazide (HYDRODIURIL) 25 MG tablet Take 25 mg by mouth daily. 02/21/20   [provider]  ibuprofen (ADVIL,MOTRIN) 200 MG tablet Take 600 mg by mouth 2 (two) times daily.    [provider]  loratadine (CLARITIN) 10 MG tablet Take by mouth. 06/25/19 06/24/20  [provider]  losartan (COZAAR) 50 MG tablet Take 1 tablet by mouth daily. 12/20/20   [provider]  memantine (NAMENDA) 10 MG tablet Take 10 mg by mouth 2 (two) times daily. 02/21/20   [provider]  potassium chloride 20 MEQ/15ML (10%)  SOLN SMARTSIG:Milliliter(s) By Mouth 12/30/20   [provider]  senna (SENOKOT) 8.6 MG tablet Take 2 tablets by mouth daily.    [provider]  venlafaxine XR (EFFEXOR-XR) 37.5 MG 24 hr capsule Take 37.5 mg by mouth daily. 02/21/20   [provider]  verapamil (CALAN-SR) 180 MG CR tablet Take 180 mg by mouth 2 (two) times daily.     [provider]  PARoxetine (PAXIL) 30 MG tablet Take 30 mg by mouth at bedtime.   04/14/20   [provider]    Allergies Other  Family History  Problem Relation Age of Onset   Heart attack Mother        CABG in her 97's   Cancer Mother    CAD Brother        60   Hypertension Brother        44    Social History Social History   Tobacco Use   Smoking status: Never   Smokeless tobacco: Never  Vaping Use   Vaping Use: Never used  Substance Use Topics   Alcohol use: No   Drug use: No    Review of Systems Constitutional: No fever/chills Eyes: No visual changes. ENT: No sore throat. Cardiovascular: + chest pain. Respiratory: Denies shortness of breath. Gastrointestinal: + abdominal pain.  No nausea, no vomiting.  No diarrhea.  No constipation. Genitourinary: Negative for dysuria. Musculoskeletal: Negative for back pain. Skin: Negative for rash. Neurological: Negative for headaches, focal weakness or numbness.   ____________________________________________   PHYSICAL EXAM:  VITAL SIGNS: ED Triage Vitals  Enc Vitals Group     BP 02/04/21 1614 (!) 165/56     Pulse Rate 02/04/21 1612 72     Resp 02/04/21 1612 20     Temp 02/04/21 1612 98.5 F (36.9 C)     Temp Source 02/04/21 1612 Oral     SpO2 02/04/21 1612 98 %     Weight 02/04/21 1606 150 lb (68 kg)     Height 02/04/21 1606 5\' 6"  (1.676 m)     Head Circumference --      Peak Flow --      Pain Score --      Pain Loc --      Pain Edu? --      Excl. in GC? --    Constitutional: Alert and oriented. Well appearing and in no acute distress. Eyes: Mild conjunctival erythema without drainage of the right eye, left conjunctiva normal. PERRL. EOMI. Head: Atraumatic. Nose: No congestion/rhinnorhea. Mouth/Throat: Mucous membranes are moist.  Oropharynx non-erythematous. Neck: No stridor.   Cardiovascular: Normal rate, regular rhythm. Grossly normal heart sounds.  Good peripheral circulation. Respiratory: Normal respiratory effort.  No retractions. Lungs CTAB. Gastrointestinal: Diffusely tender  and the patient turns to guard the abdomen.  No specific area of increased tenderness able to be identified. No distention. No abdominal bruits. No CVA tenderness. Rectal exam shows non-thrombosed non-actively bleeding hemorrhoids, stool unable to be palpated in the rectal vault. No other gross palpable abnormalities. Musculoskeletal: No lower extremity tenderness nor edema.  No joint effusions. Neurologic: No gross focal neurologic deficits are appreciated.  Skin:  Skin is warm, dry and intact. No rash noted.  ____________________________________________   LABS (all labs ordered are listed, but only abnormal results are displayed)  Labs Reviewed  CBC - Abnormal; Notable for the following components:      Result Value   RBC 3.67 (*)    Hemoglobin 11.1 (*)  HCT 31.4 (*)    All other components within normal limits  URINALYSIS, COMPLETE (UACMP) WITH MICROSCOPIC - Abnormal; Notable for the following components:   Color, Urine STRAW (*)    APPearance CLEAR (*)    Leukocytes,Ua MODERATE (*)    All other components within normal limits  BASIC METABOLIC PANEL  PROTIME-INR  TROPONIN I (HIGH SENSITIVITY)  TROPONIN I (HIGH SENSITIVITY)   ____________________________________________  EKG  EKG shows normal sinus rhythm with a rate of 81 beats a minute.  No ST elevations or depressions.  Normal QTC. ____________________________________________  RADIOLOGY I, Melody Stone, personally viewed and evaluated these images (plain radiographs) as part of my medical decision making, as well as reviewing the written report by the radiologist.  ED provider interpretation:  Chest XR with no significant acute findings; KUB with large stool volume without obstructive pattern  Official radiology report(s): DG Chest 2 View  Result Date: 02/04/2021 CLINICAL DATA:  Pt continues to complain of chest pain. chest pain EXAM: CHEST - 2 VIEW COMPARISON:  None. FINDINGS: Normal mediastinum and cardiac  silhouette. Normal pulmonary vasculature. No evidence of effusion, infiltrate, or pneumothorax. No acute bony abnormality. IMPRESSION: No acute cardiopulmonary process. Electronically Signed   By: Genevive Bi M.D.   On: 02/04/2021 17:07   DG Abdomen 1 View  Result Date: 02/04/2021 CLINICAL DATA:  Constipation. Abdominal pain. Small-bowel obstruction. EXAM: ABDOMEN - 1 VIEW COMPARISON:  06/29/2020 FINDINGS: Large amount of fecal matter and gas within the colon. Small bowel pattern is unremarkable. No free air. No abnormal calcifications. Chronic degenerative changes affect the spine. IMPRESSION: No finding to suggest small bowel obstruction. Fairly large amount of fecal matter and gas within the colon. Electronically Signed   By: Paulina Fusi M.D.   On: 02/04/2021 18:56     ____________________________________________   INITIAL IMPRESSION / ASSESSMENT AND PLAN / ED COURSE  As part of my medical decision making, I reviewed the following data within the electronic MEDICAL RECORD NUMBER History obtained from family, Nursing notes reviewed and incorporated, Labs reviewed, Radiograph reviewed, Evaluated by EM attending Dr. Delton Prairie, and Notes from prior ED visits        Patient is a 70 year old female who presents to the emergency department for evaluation of chest pain.  History is significantly limited by the patient's intellectual disability.  Reportedly she was complaining of chest pain at her facility and brought here.  Further history provided by the representative from the group home discusses an unknown last bowel movement.  In triage patient was mildly hypertensive but otherwise has normal vital signs.  Physical exam does show mildly erythematous conjunctive on the right, none on the left.  Member from the group home states that she frequently gets pinkeye and requests antibiotic for this.  Remainder of exam has normal auscultation of the heart and lungs.  She does seem somewhat tender  diffusely across the abdomen and does guard in turn away from me during evaluation.  She does allow me to perform a rectal exam with no palpable stool and no actively bleeding hemorrhoids.  Evaluation began with CBC, BMP, PT/INR, troponins.  She has a mild microcytic anemia but otherwise labs are grossly normal including repeat troponin.  Urinalysis demonstrates moderate leukocytes without any bacteria or nitrites.  Chest x-ray is negative for acute pneumonia or other acute finding.  EKG grossly unremarkable.  She does appear stable from a cardiac perspective.  Given her tenderness with exam of the abdomen, KUB was performed  and demonstrates a large stool burden.  Feeling no palpable stool in the vault, smog enema was ordered and given by nursing.  She was able to produce a moderate amount of stool following this.  During repeat exam at discharge, the representative from the group home pointed out some blood that seem to be near her rectum.  Repeat exam reveals this to be actually coming from vaginal source, and this was not identified previously.  I did discuss this with attending Dr. Larinda ButteryJessup as previous attending Dr. Katrinka BlazingSmith had already signed out and felt it is likely safe for outpatient management with OB/GYN.  This was discussed with representative from the group home who was in agreement.  Recommended MiraLAX for 1 week until follow-up with PCP.  Also recommended erythromycin ointment of the right eye which was prescribed.  Return precautions were strictly discussed and representative is agreeing with plan at this time.      ____________________________________________   FINAL CLINICAL IMPRESSION(S) / ED DIAGNOSES  Final diagnoses:  Atypical chest pain  Constipation, unspecified constipation type  Vaginal bleeding     ED Discharge Orders          Ordered    erythromycin ophthalmic ointment  3 times daily        02/04/21 2156    polyethylene glycol (MIRALAX) 17 g packet  Daily         02/04/21 2156             Note:  This document was prepared using Dragon voice recognition software and may include unintentional dictation errors.    Melody ChrisRodgers, Melody Steines J, PA 02/04/21 2238    Delton PrairieSmith, Dylan, MD 02/05/21 2252

## 2021-02-04 NOTE — ED Notes (Signed)
Report given to caregiver at bedside, she states she is keeping patients 2 brothers up to date on care.

## 2021-02-04 NOTE — ED Notes (Signed)
Patient given approx enema solution, unable to tolerate further fluid. Patient resting on stretcher, bedside commode next to patient

## 2021-02-04 NOTE — ED Triage Notes (Signed)
Pt alert, NAD at this time. Pt states her chest is hurting. Pt points to left chest when asked where it hurts. Pt states it started hurting today. Pt developmentally delayed, unable to give a thorough history.

## 2021-02-04 NOTE — ED Notes (Signed)
Patient transported to X-ray 

## 2021-02-04 NOTE — ED Triage Notes (Signed)
Pt comes into the ED via [facility] from [facility].  Pt c/o central chest pain.  324 mg ASA given in route.  146/93, NSR EKG.  Pt was seen by her PCP yesterday for BP medication management and she has started them as of today.  Pt in NAD at this time with even and unlabored respirations.  Pt has h/o mental retardation, facility is supposed to send someone to sit with the patient.

## 2021-03-04 ENCOUNTER — Encounter: Payer: Self-pay | Admitting: Emergency Medicine

## 2021-03-04 ENCOUNTER — Emergency Department: Payer: Medicare Other

## 2021-03-04 ENCOUNTER — Emergency Department
Admission: EM | Admit: 2021-03-04 | Discharge: 2021-03-04 | Disposition: A | Discharge status: Home / Self Care | Payer: Medicare Other | Attending: Emergency Medicine | Admitting: Emergency Medicine

## 2021-03-04 DIAGNOSIS — S0990XA Unspecified injury of head, initial encounter: Secondary | ICD-10-CM | POA: Diagnosis present

## 2021-03-04 DIAGNOSIS — Y92009 Unspecified place in unspecified non-institutional (private) residence as the place of occurrence of the external cause: Secondary | ICD-10-CM | POA: Insufficient documentation

## 2021-03-04 DIAGNOSIS — F0390 Unspecified dementia without behavioral disturbance: Secondary | ICD-10-CM | POA: Diagnosis not present

## 2021-03-04 DIAGNOSIS — Z79899 Other long term (current) drug therapy: Secondary | ICD-10-CM | POA: Insufficient documentation

## 2021-03-04 DIAGNOSIS — S0181XA Laceration without foreign body of other part of head, initial encounter: Secondary | ICD-10-CM | POA: Insufficient documentation

## 2021-03-04 DIAGNOSIS — I10 Essential (primary) hypertension: Secondary | ICD-10-CM | POA: Insufficient documentation

## 2021-03-04 DIAGNOSIS — W01198A Fall on same level from slipping, tripping and stumbling with subsequent striking against other object, initial encounter: Secondary | ICD-10-CM | POA: Insufficient documentation

## 2021-03-04 NOTE — ED Notes (Signed)
Pt is from [facility] group home.

## 2021-03-04 NOTE — ED Triage Notes (Signed)
Pt arrived via [facility] from group home where she tripped over pants leg and fell forward hitting her head on tile flooring. Per witness, pt did not lose consciousness. Staff with pt states pt is not on blood thinners. Approx. 1 inch laceration to the right side of forehead. Bleeding controled and bandage in place.

## 2021-03-04 NOTE — ED Notes (Signed)
First nurse-pt brought in via ems from the group home with a fall.  Pt tripped over pants and struck floor.  Lac above right eye.  bp 187/81 per ems   Pt alert.

## 2021-03-04 NOTE — Discharge Instructions (Addendum)
Patient may take 1000 mg of Tylenol every 6 hours as needed for pain.  CT head and cervical spine showed no acute injury.

## 2021-03-04 NOTE — ED Provider Notes (Signed)
[facility] Emergency Department Provider Note ____________________________________________   Event Date/Time   First MD Initiated Contact with Patient 03/04/21 2300     (approximate)  I have reviewed the triage vital signs and the nursing notes.   HISTORY  Chief Complaint No chief complaint on file.    HPI Melody Stone is a 70 y.o. female with history of dementia, hypertension, hyperlipidemia, mental retardation who presents to the emergency department from her group home after she tripped and fell over her pants today hitting her head on the floor.  No loss of consciousness.  Not on blood thinners.  Has no complaints at this time.         Past Medical History:  Diagnosis Date   Anemia    Aortic insufficiency    a. 10/2015 Echo: Nl LV fxn, mild to moderate AI; b. 02/2017 Echo: EF 60-65%, no rwma, mild AI.   Chest pain    a. 10/2015 Ex MV [facility]): EF 69%, no ischemia; b. 01/2017 Lexiscan MV: EF 45%, no ischemia/infarct-->Low risk.   Dementia (HCC)    Dyspnea on exertion    Hepatitis C    HLD (hyperlipidemia)    Hypertension    Mental retardation    Reflux esophagitis     Patient Active Problem List   Diagnosis Date Noted   Dysphagia, oropharyngeal 07/08/2016   Lung nodule, solitary 03/30/2016   Moderate aortic insufficiency 03/30/2016   HCV (hepatitis C virus) 02/19/2016   HLD (hyperlipidemia) 02/19/2016   BP (high blood pressure) 02/19/2016   Intellectual disability 02/19/2016   Esophagitis, reflux 02/19/2016   Breathlessness on exertion 10/27/2015    Past Surgical History:  Procedure Laterality Date   ABDOMINAL HYSTERECTOMY     APPENDECTOMY      Prior to Admission medications   Medication Sig Start Date End Date Taking? Authorizing Provider  amoxicillin-clavulanate (AUGMENTIN) 875-125 MG tablet Take 1 tablet by mouth 2 (two) times daily. Patient not taking: No sig reported 04/14/20   Tommie Samsook, Jayce G, DO  Cholecalciferol  (VITAMIN D3) 1000 units CAPS Take 2,000 Units by mouth daily.     [provider]  clonazePAM (KLONOPIN) 0.5 MG tablet Take 0.25-0.5 mg by mouth See admin instructions. Take  tablet (0.25mg ) by mouth every morning and 1 tablet (1mg ) by mouth every evening    [provider]  docusate sodium (COLACE) 100 MG capsule Take 100 mg by mouth 2 (two) times daily.    [provider]  donepezil (ARICEPT) 5 MG tablet Take 5 mg by mouth daily. 02/28/20   [provider]  hydrochlorothiazide (HYDRODIURIL) 25 MG tablet Take 25 mg by mouth daily. 02/21/20   [provider]  ibuprofen (ADVIL,MOTRIN) 200 MG tablet Take 600 mg by mouth 2 (two) times daily.    [provider]  loratadine (CLARITIN) 10 MG tablet Take by mouth. 06/25/19 06/24/20  [provider]  losartan (COZAAR) 50 MG tablet Take 1 tablet by mouth daily. 12/20/20   [provider]  memantine (NAMENDA) 10 MG tablet Take 10 mg by mouth 2 (two) times daily. 02/21/20   [provider]  polyethylene glycol (MIRALAX) 17 g packet Take 17 g by mouth daily. 02/04/21   Lucy Chrisodgers, Caitlin J, PA  potassium chloride 20 MEQ/15ML (10%) SOLN SMARTSIG:Milliliter(s) By Mouth 12/30/20   [provider]  senna (SENOKOT) 8.6 MG tablet Take 2 tablets by mouth daily.    [provider]  venlafaxine XR (EFFEXOR-XR) 37.5 MG 24  hr capsule Take 37.5 mg by mouth daily. 02/21/20   [provider]  verapamil (CALAN-SR) 180 MG CR tablet Take 180 mg by mouth 2 (two) times daily.     [provider]  PARoxetine (PAXIL) 30 MG tablet Take 30 mg by mouth at bedtime.   04/14/20  [provider]    Allergies Other and Ciprofloxacin  Family History  Problem Relation Age of Onset   Heart attack Mother        CABG in her 80's   Cancer Mother    CAD Brother        30   Hypertension Brother        81    Social History Social History   Tobacco Use   Smoking status:  Never   Smokeless tobacco: Never  Vaping Use   Vaping Use: Never used  Substance Use Topics   Alcohol use: No   Drug use: No    Review of Systems Level 5 caveat secondary to dementia   ____________________________________________   PHYSICAL EXAM:  VITAL SIGNS: ED Triage Vitals [03/04/21 1916]  Enc Vitals Group     BP (!) 142/69     Pulse Rate 71     Resp 20     Temp 98.2 F (36.8 C)     Temp Source Oral     SpO2 100 %     Weight      Height      Head Circumference      Peak Flow      Pain Score      Pain Loc      Pain Edu?      Excl. in GC?    CONSTITUTIONAL: Alert and confused, in no distress, elderly HEAD: Normocephalic; 2 cm laceration to the right forehead EYES: Conjunctivae clear, PERRL, EOMI ENT: normal nose; no rhinorrhea; moist mucous membranes; pharynx without lesions noted; no dental injury; no septal hematoma NECK: Supple, no meningismus, no LAD; no midline spinal tenderness, step-off or deformity; trachea midline CARD: RRR; S1 and S2 appreciated; no murmurs, no clicks, no rubs, no gallops RESP: Normal chest excursion without splinting or tachypnea; breath sounds clear and equal bilaterally; no wheezes, no rhonchi, no rales; no hypoxia or respiratory distress CHEST:  chest wall stable, no crepitus or ecchymosis or deformity, nontender to palpation; no flail chest ABD/GI: Normal bowel sounds; non-distended; soft, non-tender, no rebound, no guarding; no ecchymosis or other lesions noted PELVIS:  stable, nontender to palpation BACK:  The back appears normal and is non-tender to palpation, there is no CVA tenderness; no midline spinal tenderness, step-off or deformity EXT: Normal ROM in all joints; non-tender to palpation; no edema; normal capillary refill; no cyanosis, no bony tenderness or bony deformity of patient's extremities, no joint effusion, compartments are soft, extremities are warm and well-perfused, no ecchymosis SKIN: Normal color for age and  race; warm NEURO: Moves all extremities equally, normal speech, no facial asymmetry PSYCH: The patient's mood and manner are appropriate. Grooming and personal hygiene are appropriate.  ____________________________________________   LABS (all labs ordered are listed, but only abnormal results are displayed)  Labs Reviewed - No data to display ____________________________________________  EKG   ____________________________________________  RADIOLOGY I, Zamaria Brazzle, personally viewed and evaluated these images (plain radiographs) as part of my medical decision making, as well as reviewing the written report by the radiologist.  ED MD interpretation: CT head and cervical spine show no acute abnormality other than right frontal scalp  edema.  Official radiology report(s): CT Head Wo Contrast  Result Date: 03/04/2021 CLINICAL DATA:  Tripped over pants leading to fall striking floor. Laceration above eye. EXAM: CT HEAD WITHOUT CONTRAST TECHNIQUE: Contiguous axial images were obtained from the base of the skull through the vertex without intravenous contrast. COMPARISON:  Head CT 09/29/2018 FINDINGS: Brain: Stable generalized atrophy and chronic small vessel ischemia. Ventriculomegaly is likely due to central atrophy, not significantly changed. There is a remote lacunar infarct in the right basal ganglia. No hemorrhage, subdural collection, evidence of acute ischemia, midline shift or mass effect. Vascular: Atherosclerosis of skullbase vasculature without hyperdense vessel or abnormal calcification. Skull: No fracture or focal lesion. Sinuses/Orbits: Paranasal sinuses and mastoid air cells are clear. The visualized orbits are unremarkable. Other: Minimal right frontal scalp edema. IMPRESSION: 1. Minimal right frontal scalp edema. No acute intracranial abnormality. No skull fracture. 2. Stable atrophy and chronic small vessel ischemia. Electronically Signed   By: Narda Rutherford M.D.   On:  03/04/2021 19:51   CT Cervical Spine Wo Contrast  Result Date: 03/04/2021 CLINICAL DATA:  Head trauma, minor (Age >= 65y) EXAM: CT CERVICAL SPINE WITHOUT CONTRAST TECHNIQUE: Multidetector CT imaging of the cervical spine was performed without intravenous contrast. Multiplanar CT image reconstructions were also generated. COMPARISON:  None available. FINDINGS: Alignment: Broad-based reversal of normal lordosis. There is trace anterolisthesis of C4 on C5, likely degenerative. No traumatic subluxation. Skull base and vertebrae: No acute fracture. Vertebral body heights are maintained. The dens and skull base are intact. Degenerative change at C1-C2. Soft tissues and spinal canal: No prevertebral fluid or swelling. No visible canal hematoma. Disc levels: Degenerative disc disease from C4-C5 through C7-T1. There is multilevel facet hypertrophy. No high-grade canal stenosis. Upper chest: Negative. Other: None. IMPRESSION: 1. No acute fracture or subluxation of the cervical spine. 2. Multilevel degenerative disc disease and facet hypertrophy. Electronically Signed   By: Narda Rutherford M.D.   On: 03/04/2021 19:54    ____________________________________________   PROCEDURES  Procedure(s) performed (including Critical Care):  Procedures  LACERATION REPAIR Performed by: Baxter Hire Nathaly Dawkins Authorized by: Baxter Hire Kamica Florance Consent: Verbal consent obtained. Risks and benefits: risks, benefits and alternatives were discussed Consent given by: patient Patient identity confirmed: provided demographic data Prepped and Draped in normal sterile fashion Wound explored  Laceration Location: Right forehead  Laceration Length: 2cm  No Foreign Bodies seen or palpated  Anesthesia: None  Anesthetic total: 0 ml  Irrigation method: syringe Amount of cleaning: standard  Skin closure: Dermabond  Number of sutures: Dermabond  Technique: Cleaned with saline, alcohol swabs and closed with Dermabond.  Sterile dressing  applied.  Patient tolerance: Patient tolerated the procedure well with no immediate complications.   ____________________________________________   INITIAL IMPRESSION / ASSESSMENT AND PLAN / ED COURSE  As part of my medical decision making, I reviewed the following data within the electronic MEDICAL RECORD NUMBER History obtained from family, Nursing notes reviewed and incorporated, Radiograph reviewed , and Notes from prior ED visits         Patient here with mechanical fall, head injury.  CT head and cervical spine show no acute abnormality.  Patient at her mental baseline.  Wound cleaned and repaired with Dermabond.  Discharged home with head injury return precautions.  No other sign of injury on exam.  Caregiver at bedside and seems appropriate.  No concern for nonaccidental trauma.  At this time, I do not feel there is any life-threatening condition present. I have reviewed, interpreted and  discussed all results (EKG, imaging, lab, urine as appropriate) and exam findings with patient/family. I have reviewed nursing notes and appropriate previous records.  I feel the patient is safe to be discharged home without further emergent workup and can continue workup as an outpatient as needed. Discussed usual and customary return precautions. Patient/family verbalize understanding and are comfortable with this plan.  Outpatient follow-up has been provided as needed. All questions have been answered.   ____________________________________________   FINAL CLINICAL IMPRESSION(S) / ED DIAGNOSES  Final diagnoses:  Injury of head, initial encounter  Forehead laceration, initial encounter     ED Discharge Orders     None       *Please note:  Laya Letendre Goffe was evaluated in Emergency Department on 03/04/2021 for the symptoms described in the history of present illness. She was evaluated in the context of the global COVID-19 pandemic, which necessitated consideration that the patient might be at  risk for infection with the SARS-CoV-2 virus that causes COVID-19. Institutional protocols and algorithms that pertain to the evaluation of patients at risk for COVID-19 are in a state of rapid change based on information released by regulatory bodies including the CDC and federal and state organizations. These policies and algorithms were followed during the patient's care in the ED.  Some ED evaluations and interventions may be delayed as a result of limited staffing during and the pandemic.*   Note:  This document was prepared using Dragon voice recognition software and may include unintentional dictation errors.    Shaneque Merkle, Layla Maw, DO 03/04/21 2352

## 2021-05-17 ENCOUNTER — Emergency Department: Payer: Medicare Other

## 2021-05-17 ENCOUNTER — Other Ambulatory Visit: Payer: Self-pay

## 2021-05-17 ENCOUNTER — Emergency Department
Admission: EM | Admit: 2021-05-17 | Discharge: 2021-05-17 | Disposition: A | Discharge status: Home / Self Care | Payer: Medicare Other | Attending: Emergency Medicine | Admitting: Emergency Medicine

## 2021-05-17 ENCOUNTER — Ambulatory Visit
Admission: EM | Admit: 2021-05-17 | Discharge: 2021-05-17 | Disposition: A | Discharge status: Home / Self Care | Payer: Medicare Other

## 2021-05-17 DIAGNOSIS — I10 Essential (primary) hypertension: Secondary | ICD-10-CM | POA: Insufficient documentation

## 2021-05-17 DIAGNOSIS — S0990XA Unspecified injury of head, initial encounter: Secondary | ICD-10-CM | POA: Diagnosis present

## 2021-05-17 DIAGNOSIS — W19XXXA Unspecified fall, initial encounter: Secondary | ICD-10-CM

## 2021-05-17 DIAGNOSIS — F0390 Unspecified dementia without behavioral disturbance: Secondary | ICD-10-CM | POA: Diagnosis not present

## 2021-05-17 DIAGNOSIS — M79604 Pain in right leg: Secondary | ICD-10-CM | POA: Diagnosis not present

## 2021-05-17 DIAGNOSIS — M7918 Myalgia, other site: Secondary | ICD-10-CM

## 2021-05-17 DIAGNOSIS — W07XXXA Fall from chair, initial encounter: Secondary | ICD-10-CM | POA: Insufficient documentation

## 2021-05-17 DIAGNOSIS — Z79899 Other long term (current) drug therapy: Secondary | ICD-10-CM | POA: Insufficient documentation

## 2021-05-17 DIAGNOSIS — S0003XA Contusion of scalp, initial encounter: Secondary | ICD-10-CM | POA: Diagnosis not present

## 2021-05-17 LAB — URINALYSIS, COMPLETE (UACMP) WITH MICROSCOPIC
Bacteria, UA: NONE SEEN
Bilirubin Urine: NEGATIVE
Glucose, UA: NEGATIVE mg/dL
Hgb urine dipstick: NEGATIVE
Ketones, ur: NEGATIVE mg/dL
Nitrite: NEGATIVE
Protein, ur: NEGATIVE mg/dL
Specific Gravity, Urine: >1.03 — ABNORMAL HIGH (ref 1.005–1.030)
pH: 6 (ref 5.0–8.0)

## 2021-05-17 LAB — COMPREHENSIVE METABOLIC PANEL
ALT: 18 U/L (ref 0–44)
AST: 22 U/L (ref 15–41)
Albumin: 4.2 g/dL (ref 3.5–5.0)
Alkaline Phosphatase: 121 U/L (ref 38–126)
Anion gap: 11 (ref 5–15)
BUN: 17 mg/dL (ref 8–23)
CO2: 27 mmol/L (ref 22–32)
Calcium: 9.5 mg/dL (ref 8.9–10.3)
Chloride: 101 mmol/L (ref 98–111)
Creatinine, Ser: 0.69 mg/dL (ref 0.44–1.00)
GFR, Estimated: >60 mL/min (ref >60)
Glucose, Bld: 111 mg/dL — ABNORMAL HIGH (ref 70–99)
Potassium: 4 mmol/L (ref 3.5–5.1)
Sodium: 139 mmol/L (ref 135–145)
Total Bilirubin: 0.7 mg/dL (ref 0.3–1.2)
Total Protein: 7.3 g/dL (ref 6.5–8.1)

## 2021-05-17 LAB — CBC WITH DIFFERENTIAL/PLATELET
Abs Immature Granulocytes: 0.03 10*3/uL (ref 0.00–0.07)
Basophils Absolute: 0.1 10*3/uL (ref 0.0–0.1)
Basophils Relative: 1 %
Eosinophils Absolute: 0.1 10*3/uL (ref 0.0–0.5)
Eosinophils Relative: 1 %
HCT: 34.3 % — ABNORMAL LOW (ref 36.0–46.0)
Hemoglobin: 12 g/dL (ref 12.0–15.0)
Immature Granulocytes: 0 %
Lymphocytes Relative: 24 %
Lymphs Abs: 2.6 10*3/uL (ref 0.7–4.0)
MCH: 31.1 pg (ref 26.0–34.0)
MCHC: 35 g/dL (ref 30.0–36.0)
MCV: 88.9 fL (ref 80.0–100.0)
Monocytes Absolute: 0.7 10*3/uL (ref 0.1–1.0)
Monocytes Relative: 6 %
Neutro Abs: 7.7 10*3/uL (ref 1.7–7.7)
Neutrophils Relative %: 68 %
Platelets: 242 10*3/uL (ref 150–400)
RBC: 3.86 MIL/uL — ABNORMAL LOW (ref 3.87–5.11)
RDW: 13.9 % (ref 11.5–15.5)
WBC: 11.1 10*3/uL — ABNORMAL HIGH (ref 4.0–10.5)
nRBC: 0 % (ref 0.0–0.2)

## 2021-05-17 MED ORDER — ACETAMINOPHEN 325 MG PO TABS: 650.0000 mg | ORAL_TABLET | Freq: Four times a day (QID) | ORAL | 0 refills | Status: DC | PRN

## 2021-05-17 NOTE — ED Provider Notes (Signed)
Emergency Medicine Provider Triage Evaluation Note  Melody Stone , a 70 y.o. female  was evaluated in triage.  Pt complains of pain post fall. Right lower extremity pain and hematoma to her head. No reported loss of consciousness.   Review of Systems   Level 5 Caveat; Cognitive disability  Physical Exam  BP (!) 144/64   Pulse 60   Temp 98.5 F (36.9 C) (Oral)   Resp 18  Gen:   Awake, no distress   Resp:  Normal effort  MSK:   Moves extremities without difficulty  Other:    Medical Decision Making  Medically screening exam initiated at 4:37 PM.  Appropriate orders placed.  Melody Stone was informed that the remainder of the evaluation will be completed by another provider, this initial triage assessment does not replace that evaluation, and the importance of remaining in the ED until their evaluation is complete.    Chinita Pester, FNP 05/17/21 1743    Jene Every, MD 05/19/21 [phone]

## 2021-05-17 NOTE — ED Triage Notes (Signed)
Pt brought in by caregiver after fall this morning. Patient has cognitive disabilities. Her caregiver reported that she tripped over a chair and fell to floor onto the left side of head. There is a significant amount of swelling to left forehead. Pt c/o headache. Caregiver denies. LOC.   Caregiver made telephone call to Danton Clap (her supervisor) during triage and elected to leave Urgent Care and proceed to local Emergency for further treatment. This RN notified J.Ryan NP of same. VS stable. Pt ambulatory.

## 2021-05-17 NOTE — ED Triage Notes (Signed)
Pt to ED with caregiver for fall from day program. Caregiver reports right leg pain and knot to head.  Reports tripped over chair From [facility]  This RN attempted to contact [employer] guardian [phone], no answer

## 2021-05-17 NOTE — Discharge Instructions (Signed)
Please see primary care for symptoms of concern.   Give tylenol every 6-8 hours if needed for pain.  Return to the ER for symptoms that change or worsen if unable to schedule an appointment with primary care.

## 2021-05-17 NOTE — ED Notes (Signed)
MD Derrill Kay attempted to contact legal guardian multiple times with no success.

## 2021-05-17 NOTE — ED Provider Notes (Signed)
[facility] Emergency Department Provider Note ____________________________________________   Event Date/Time   First MD Initiated Contact with Patient 05/17/21 1948     (approximate)  I have reviewed the triage vital signs and the nursing notes.   HISTORY  Chief Complaint Fall  HPI Gwendolen Hewlett is a 70 y.o. female with history of cognitive delay, hypertension, and history as listed below presents to the emergency department for treatment and evaluation after tripping over a chair while at her day program. She hit her head, but didn't lose consciousness. She complains of headache and right leg pain. Caregiver states that she was limping initially, but seems ok now..         Past Medical History:  Diagnosis Date   Anemia    Aortic insufficiency    a. 10/2015 Echo: Nl LV fxn, mild to moderate AI; b. 02/2017 Echo: EF 60-65%, no rwma, mild AI.   Chest pain    a. 10/2015 Ex MV Gavin Potters): EF 69%, no ischemia; b. 01/2017 Lexiscan MV: EF 45%, no ischemia/infarct-->Low risk.   Dementia (HCC)    Dyspnea on exertion    Hepatitis C    HLD (hyperlipidemia)    Hypertension    Mental retardation    Reflux esophagitis     Patient Active Problem List   Diagnosis Date Noted   Dysphagia, oropharyngeal 07/08/2016   Lung nodule, solitary 03/30/2016   Moderate aortic insufficiency 03/30/2016   HCV (hepatitis C virus) 02/19/2016   HLD (hyperlipidemia) 02/19/2016   BP (high blood pressure) 02/19/2016   Intellectual disability 02/19/2016   Esophagitis, reflux 02/19/2016   Breathlessness on exertion 10/27/2015    Past Surgical History:  Procedure Laterality Date   ABDOMINAL HYSTERECTOMY     APPENDECTOMY      Prior to Admission medications   Medication Sig Start Date End Date Taking? Authorizing Provider  acetaminophen (TYLENOL) 325 MG tablet Take 2 tablets (650 mg total) by mouth every 6 (six) hours as needed. 05/17/21 05/17/22 Yes Shandora Koogler B, FNP   amoxicillin-clavulanate (AUGMENTIN) 875-125 MG tablet Take 1 tablet by mouth 2 (two) times daily. Patient not taking: No sig reported 04/14/20   Tommie Sams, DO  Cholecalciferol (VITAMIN D3) 1000 units CAPS Take 2,000 Units by mouth daily.     [provider]  clonazePAM (KLONOPIN) 0.5 MG tablet Take 0.25-0.5 mg by mouth See admin instructions. Take  tablet (0.25mg ) by mouth every morning and 1 tablet (1mg ) by mouth every evening    [provider]  docusate sodium (COLACE) 100 MG capsule Take 100 mg by mouth 2 (two) times daily.    [provider]  donepezil (ARICEPT) 5 MG tablet Take 5 mg by mouth daily. 02/28/20   [provider]  hydrochlorothiazide (HYDRODIURIL) 25 MG tablet Take 25 mg by mouth daily. 02/21/20   [provider]  ibuprofen (ADVIL,MOTRIN) 200 MG tablet Take 600 mg by mouth 2 (two) times daily.    [provider]  loratadine (CLARITIN) 10 MG tablet Take by mouth. 06/25/19 06/24/20  [provider]  losartan (COZAAR) 50 MG tablet Take 1 tablet by mouth daily. 12/20/20   [provider]  memantine (NAMENDA) 10 MG tablet Take 10 mg by mouth 2 (two) times daily. 02/21/20   [provider]  polyethylene glycol (MIRALAX) 17 g packet Take 17 g by mouth daily. 02/04/21   04/06/21, PA  potassium chloride 20 MEQ/15ML (10%) SOLN SMARTSIG:Milliliter(s) By Mouth 12/30/20  [provider]  senna (SENOKOT) 8.6 MG tablet Take 2 tablets by mouth daily.    [provider]  venlafaxine XR (EFFEXOR-XR) 37.5 MG 24 hr capsule Take 37.5 mg by mouth daily. 02/21/20   [provider]  verapamil (CALAN-SR) 180 MG CR tablet Take 180 mg by mouth 2 (two) times daily.     [provider]  PARoxetine (PAXIL) 30 MG tablet Take 30 mg by mouth at bedtime.   04/14/20  [provider]    Allergies Other and Ciprofloxacin  Family History  Problem Relation Age of Onset   Heart attack  Mother        CABG in her 58's   Cancer Mother    CAD Brother        1   Hypertension Brother        63    Social History Social History   Tobacco Use   Smoking status: Never   Smokeless tobacco: Never  Vaping Use   Vaping Use: Never used  Substance Use Topics   Alcohol use: No   Drug use: No    Review of Systems  Level 5 Caveat: patient unable to verbalize  ____________________________________________   PHYSICAL EXAM:  VITAL SIGNS: ED Triage Vitals  Enc Vitals Group     BP 05/17/21 1631 (!) 144/64     Pulse Rate 05/17/21 1631 60     Resp 05/17/21 1631 18     Temp 05/17/21 1631 98.5 F (36.9 C)     Temp Source 05/17/21 1631 Oral     SpO2 05/17/21 1636 100 %     Weight 05/17/21 1640 143 lb (64.9 kg)     Height --      Head Circumference --      Peak Flow --      Pain Score --      Pain Loc --      Pain Edu? --      Excl. in GC? --     Constitutional: Alert. Well appearing and in no acute distress. Eyes: Conjunctivae are normal. Head: Hematoma to scalp noted. Nose: No congestion/rhinnorhea. Mouth/Throat: Mucous membranes are moist.  Oropharynx non-erythematous. Neck: No stridor.   Hematological/Lymphatic/Immunilogical: No cervical lymphadenopathy. Cardiovascular: Normal rate, regular rhythm. Grossly normal heart sounds.  Good peripheral circulation. Respiratory: Normal respiratory effort.  No retractions. Lungs CTAB. Gastrointestinal: Soft and nontender. No distention. No abdominal bruits. Genitourinary:  Musculoskeletal: No lower extremity tenderness nor edema.  No joint effusions. Neurologic:  Normal speech and language. No gross focal neurologic deficits are appreciated. No gait instability. Skin:  Skin is warm, dry and intact. No rash noted. Psychiatric: Mood and affect are normal. Speech and behavior are normal.  ____________________________________________   LABS (all labs ordered are listed, but only abnormal results are displayed)  Labs  Reviewed  COMPREHENSIVE METABOLIC PANEL - Abnormal; Notable for the following components:      Result Value   Glucose, Bld 111 (*)    All other components within normal limits  CBC WITH DIFFERENTIAL/PLATELET - Abnormal; Notable for the following components:   WBC 11.1 (*)    RBC 3.86 (*)    HCT 34.3 (*)    All other components within normal limits  URINALYSIS, COMPLETE (UACMP) WITH MICROSCOPIC - Abnormal; Notable for the following components:   Specific Gravity, Urine >1.030 (*)    Leukocytes,Ua SMALL (*)    All other components within normal limits   ____________________________________________  EKG  ED ECG REPORT  I, Kem Boroughs, FNP-BC personally viewed and interpreted this ECG.   Date: 05/17/2021  EKG Time: 1645  Rate: 63  Rhythm: normal EKG, normal sinus rhythm  Axis: normal  Intervals:none  ST&T Change: no ST elevation or new LBBB  ____________________________________________  RADIOLOGY  ED MD interpretation:    Images of the right tibia/fibula negative for acute concerns.  CT head negative for acute findings per radiology.  I, Kem Boroughs, personally viewed and evaluated these images (plain radiographs) as part of my medical decision making, as well as reviewing the written report by the radiologist.  Official radiology report(s): DG Tibia/Fibula Right  Result Date: 05/17/2021 CLINICAL DATA:  Pain after fall.  Right lower leg pain. EXAM: RIGHT TIBIA AND FIBULA - 2 VIEW COMPARISON:  None. FINDINGS: The bones are subjectively under mineralized. The cortical margins are intact. No fracture. Knee and ankle alignment are maintained. Small soft tissue calcification in the posterior calf. Mild generalized soft tissue edema. IMPRESSION: Mild soft tissue edema. No fracture. Electronically Signed   By: Narda Rutherford M.D.   On: 05/17/2021 17:38   CT HEAD WO CONTRAST ( )  Result Date: 05/17/2021 CLINICAL DATA:  70 year old female with history of trauma from a fall  injuring the left side of the head on the floor. EXAM: CT HEAD WITHOUT CONTRAST TECHNIQUE: Contiguous axial images were obtained from the base of the skull through the vertex without intravenous contrast. COMPARISON:  Head CT 03/04/2021. FINDINGS: Brain: Severe cerebral atrophy with ex vacuo dilatation of the ventricular system. Patchy and confluent areas of decreased attenuation are noted throughout the deep and periventricular white matter of the cerebral hemispheres bilaterally, compatible with chronic microvascular ischemic disease. No evidence of acute infarction, hemorrhage, hydrocephalus, extra-axial collection or mass lesion/mass effect. Vascular: No hyperdense vessel or unexpected calcification. Skull: Normal. Negative for fracture or focal lesion. Sinuses/Orbits: No acute finding. Other: None. IMPRESSION: 1. No evidence of significant acute traumatic injury to the skull or brain. 2. Severe cerebral atrophy with chronic microvascular ischemic changes in the cerebral white matter, as above, similar to the prior study. Electronically Signed   By: Trudie Reed M.D.   On: 05/17/2021 17:52    ____________________________________________   PROCEDURES  Procedure(s) performed (including Critical Care):  Procedures  ____________________________________________   INITIAL IMPRESSION / ASSESSMENT AND PLAN     70 year old female presenting to the emergency department after a mechanical, nonsyncopal fall while at her day program today.  See HPI for further details.  Plan will be to review labs and imaging obtained while awaiting ER room assignment.  DIFFERENTIAL DIAGNOSIS  Minor head injury, ICH, musculoskeletal strain, tibia/tibia fracture.  ED COURSE  Images and labs including urinalysis are overall reassuring.  Vital signs are normal.  Caregiver who is able to communicate somewhat with the patient states that patient now has no headache or leg pain.  Patient is able to bear weight and walk  with some assistance.  Patient discharged home with head injury instructions.  They are to give her Tylenol if needed every 6-8 hours for pain.  For symptoms of concern they are to either see primary care or return to the emergency department.    ___________________________________________   FINAL CLINICAL IMPRESSION(S) / ED DIAGNOSES  Final diagnoses:  Fall, initial encounter  Minor head injury, initial encounter  Musculoskeletal pain     ED Discharge Orders          Ordered    acetaminophen (TYLENOL) 325 MG tablet  Every 6 hours  PRN        05/17/21 2000             Cherylynn Ridges Buffin was evaluated in Emergency Department on 05/17/2021 for the symptoms described in the history of present illness. She was evaluated in the context of the global COVID-19 pandemic, which necessitated consideration that the patient might be at risk for infection with the SARS-CoV-2 virus that causes COVID-19. Institutional protocols and algorithms that pertain to the evaluation of patients at risk for COVID-19 are in a state of rapid change based on information released by regulatory bodies including the CDC and federal and state organizations. These policies and algorithms were followed during the patient's care in the ED.   Note:  This document was prepared using Dragon voice recognition software and may include unintentional dictation errors.    Chinita Pester, FNP 05/17/21 2016    Phineas Semen, MD 05/17/21 2041

## 2021-06-09 ENCOUNTER — Emergency Department
Admission: EM | Admit: 2021-06-09 | Discharge: 2021-06-09 | Disposition: A | Discharge status: Other Acute Inpatient Hospital | Payer: Medicare Other | Attending: Emergency Medicine | Admitting: Emergency Medicine

## 2021-06-09 ENCOUNTER — Other Ambulatory Visit: Payer: Self-pay

## 2021-06-09 ENCOUNTER — Emergency Department: Payer: Medicare Other

## 2021-06-09 DIAGNOSIS — I10 Essential (primary) hypertension: Secondary | ICD-10-CM | POA: Insufficient documentation

## 2021-06-09 DIAGNOSIS — W01198A Fall on same level from slipping, tripping and stumbling with subsequent striking against other object, initial encounter: Secondary | ICD-10-CM | POA: Insufficient documentation

## 2021-06-09 DIAGNOSIS — S80212A Abrasion, left knee, initial encounter: Secondary | ICD-10-CM

## 2021-06-09 DIAGNOSIS — S80211A Abrasion, right knee, initial encounter: Secondary | ICD-10-CM

## 2021-06-09 DIAGNOSIS — Z79899 Other long term (current) drug therapy: Secondary | ICD-10-CM | POA: Insufficient documentation

## 2021-06-09 DIAGNOSIS — Z20822 Contact with and (suspected) exposure to covid-19: Secondary | ICD-10-CM | POA: Diagnosis not present

## 2021-06-09 DIAGNOSIS — F0390 Unspecified dementia without behavioral disturbance: Secondary | ICD-10-CM | POA: Diagnosis not present

## 2021-06-09 DIAGNOSIS — S8992XA Unspecified injury of left lower leg, initial encounter: Secondary | ICD-10-CM | POA: Diagnosis present

## 2021-06-09 DIAGNOSIS — Y9289 Other specified places as the place of occurrence of the external cause: Secondary | ICD-10-CM | POA: Diagnosis not present

## 2021-06-09 LAB — RESP PANEL BY RT-PCR (FLU A&B, COVID) ARPGX2
Influenza A by PCR: NEGATIVE
Influenza B by PCR: NEGATIVE
SARS Coronavirus 2 by RT PCR: NEGATIVE

## 2021-06-09 NOTE — Discharge Instructions (Signed)
COVID and influenza tests are negative.  Follow up with PCP as needed.

## 2021-06-09 NOTE — ED Triage Notes (Signed)
Pt to ED with caregiver for witnessed fall after tripping over mat hitting right knee. Denies other complaints.

## 2021-06-09 NOTE — ED Provider Notes (Signed)
[facility] Emergency Department Provider Note ____________________________________________  Time seen: Approximately 3:34 PM  I have reviewed the triage vital signs and the nursing notes.   HISTORY  Chief Complaint Fall    HPI Melody Stone is a 70 y.o. female who presents to the emergency department for evaluation and treatment of bilateral knee pain.  Caregiver states that her foot hit the edge of the mat lying in front of the door and she fell forward landing on both her knees.  She has been able to ambulate since then.  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  Past Medical History:  Diagnosis Date   Anemia    Aortic insufficiency    a. 10/2015 Echo: Nl LV fxn, mild to moderate AI; b. 02/2017 Echo: EF 60-65%, no rwma, mild AI.   Chest pain    a. 10/2015 Ex MV Gavin Potters): EF 69%, no ischemia; b. 01/2017 Lexiscan MV: EF 45%, no ischemia/infarct-->Low risk.   Dementia (HCC)    Dyspnea on exertion    Hepatitis C    HLD (hyperlipidemia)    Hypertension    Mental retardation    Reflux esophagitis     Patient Active Problem List   Diagnosis Date Noted   Dysphagia, oropharyngeal 07/08/2016   Lung nodule, solitary 03/30/2016   Moderate aortic insufficiency 03/30/2016   HCV (hepatitis C virus) 02/19/2016   HLD (hyperlipidemia) 02/19/2016   BP (high blood pressure) 02/19/2016   Intellectual disability 02/19/2016   Esophagitis, reflux 02/19/2016   Breathlessness on exertion 10/27/2015    Past Surgical History:  Procedure Laterality Date   ABDOMINAL HYSTERECTOMY     APPENDECTOMY      Prior to Admission medications   Medication Sig Start Date End Date Taking? Authorizing Provider  acetaminophen (TYLENOL) 325 MG tablet Take 2 tablets (650 mg total) by mouth every 6 (six) hours as needed. 05/17/21 05/17/22  Davionne Dowty, Rulon Eisenmenger B, FNP  amoxicillin-clavulanate (AUGMENTIN) 875-125 MG  tablet Take 1 tablet by mouth 2 (two) times daily. Patient not taking: No sig reported 04/14/20   Tommie Sams, DO  Cholecalciferol (VITAMIN D3) 1000 units CAPS Take 2,000 Units by mouth daily.     [provider]  clonazePAM (KLONOPIN) 0.5 MG tablet Take 0.25-0.5 mg by mouth See admin instructions. Take  tablet (0.25mg ) by mouth every morning and 1 tablet (1mg ) by mouth every evening    [provider]  docusate sodium (COLACE) 100 MG capsule Take 100 mg by mouth 2 (two) times daily.    [provider]  donepezil (ARICEPT) 5 MG tablet Take 5 mg by mouth daily. 02/28/20   [provider]  hydrochlorothiazide (HYDRODIURIL) 25 MG tablet Take 25 mg by mouth daily. 02/21/20   [provider]  ibuprofen (ADVIL,MOTRIN) 200 MG tablet Take 600 mg by mouth 2 (two) times daily.    [provider]  loratadine (CLARITIN) 10 MG tablet Take by mouth. 06/25/19 06/24/20  [provider]  losartan (COZAAR) 50 MG tablet Take 1 tablet by mouth daily. 12/20/20   [provider]  memantine (NAMENDA) 10 MG tablet Take 10 mg by mouth 2 (two) times daily. 02/21/20   [provider]  polyethylene glycol (MIRALAX) 17 g packet Take 17 g by mouth daily. 02/04/21   04/06/21, PA  potassium chloride 20 MEQ/15ML (10%) SOLN SMARTSIG:Milliliter(s) By Mouth 12/30/20   [provider]  senna (SENOKOT) 8.6 MG tablet Take 2 tablets by mouth daily.    [provider]  venlafaxine XR (EFFEXOR-XR) 37.5 MG 24 hr capsule Take 37.5 mg by mouth daily. 02/21/20   [provider]  verapamil (CALAN-SR) 180 MG CR tablet Take 180 mg by mouth 2 (two) times daily.     [provider]  PARoxetine (PAXIL) 30 MG tablet Take 30 mg by mouth at bedtime.   04/14/20  [provider]    Allergies Other and Ciprofloxacin  Family History  Problem Relation Age of Onset   Heart attack Mother        CABG in her 39's   Cancer Mother     CAD Brother        69   Hypertension Brother        28    Social History Social History   Tobacco Use   Smoking status: Never   Smokeless tobacco: Never  Vaping Use   Vaping Use: Never used  Substance Use Topics   Alcohol use: No   Drug use: No    Review of Systems Constitutional: Negative for fever. Cardiovascular: Negative for chest pain. Respiratory: Negative for shortness of breath. Musculoskeletal: Positive for bilateral knee pain. Skin: Positive for abrasions to bilateral knees Neurological: Negative for decrease in sensation  ____________________________________________   PHYSICAL EXAM:  VITAL SIGNS: ED Triage Vitals  Enc Vitals Group     BP 06/09/21 1103 (!) 157/56     Pulse Rate 06/09/21 1103 65     Resp 06/09/21 1103 17     Temp 06/09/21 1103 98.5 F (36.9 C)     Temp Source 06/09/21 1103 Oral     SpO2 06/09/21 1103 95 %     Weight 06/09/21 1104 143 lb 4.8 oz (65 kg)     Height --      Head Circumference --      Peak Flow --      Pain Score 06/09/21 1302 0     Pain Loc --      Pain Edu? --      Excl. in GC? --     Constitutional: Alert and oriented. Well appearing and in no acute distress. Eyes: Conjunctivae are clear without discharge or drainage Head: Atraumatic Neck: Supple.  No focal midline tenderness. Respiratory: No cough. Respirations are even and unlabored. Musculoskeletal: Patient able to demonstrate range of motion of bilateral lower extremities.  She has no tenderness on exam of either hip or ankle.  She does complain of pain when touching either patella. Neurologic: Baseline cognitive delay.  No focal weakness. Skin: Superficial abrasions overlying bilateral patellas. Psychiatric: Affect and behavior are appropriate.  ____________________________________________   LABS (all labs ordered are listed, but only abnormal results are displayed)  Labs Reviewed  RESP PANEL BY RT-PCR (FLU A&B, COVID) [id number]    ____________________________________________  RADIOLOGY  Images of the right and left knee negative for acute findings.  I, Kem Boroughs, personally viewed and evaluated these images (plain radiographs) as part of my medical decision making, as well as reviewing the written report by the radiologist.  DG Knee Complete 4 Views Left  Result Date: 06/09/2021 CLINICAL DATA:  Left knee pain after fall. EXAM: LEFT KNEE - COMPLETE 4+ VIEW COMPARISON:  None. FINDINGS: No evidence of fracture, dislocation, or joint effusion. No evidence of arthropathy or other focal bone abnormality. Soft tissues are unremarkable. IMPRESSION: Negative. Electronically Signed   By: Zenda Alpers.D.  On: 06/09/2021 12:26   DG Knee Complete 4 Views Right  Result Date: 06/09/2021 CLINICAL DATA:  Right knee pain after fall. EXAM: RIGHT KNEE - COMPLETE 4+ VIEW COMPARISON:  None. FINDINGS: No evidence of fracture, dislocation, or joint effusion. No evidence of arthropathy or other focal bone abnormality. Soft tissues are unremarkable. IMPRESSION: Negative. Electronically Signed   By: Lupita Raider M.D.   On: 06/09/2021 12:25   ____________________________________________   PROCEDURES  Procedures  ____________________________________________   INITIAL IMPRESSION / ASSESSMENT AND PLAN / ED COURSE  Melody Stone is a 70 y.o. who presents to the emergency department for treatment and evaluation of bilateral knee pain after mechanical, nonsyncopal fall that was witnessed.  She is not anticoagulated and did not hit her head.  She landed directly on her knees.  Images of bilateral knees are negative for bony abnormality.  Per the request of her workshop staff, COVID swab collected and is negative.  Printout provided at discharge.  Patient instructed to follow-up with primary care as needed.  She was also instructed to return to the emergency department for symptoms that change or worsen if unable schedule an  appointment.  Medications - No data to display  Pertinent labs & imaging results that were available during my care of the patient were reviewed by me and considered in my medical decision making (see chart for details).   _________________________________________   FINAL CLINICAL IMPRESSION(S) / ED DIAGNOSES  Final diagnoses:  Knee abrasion, left, initial encounter  Knee abrasion, right, initial encounter    ED Discharge Orders     None        If controlled substance prescribed during this visit, 12 month history viewed on the [facility] prior to issuing an initial prescription for Schedule II or III opiod.    Chinita Pester, FNP 06/09/21 1539    Dionne Bucy, MD 06/09/21 1615

## 2021-07-21 DIAGNOSIS — G309 Alzheimer's disease, unspecified: Secondary | ICD-10-CM | POA: Insufficient documentation

## 2021-09-20 ENCOUNTER — Other Ambulatory Visit: Payer: Self-pay

## 2021-09-20 ENCOUNTER — Ambulatory Visit
Admission: EM | Admit: 2021-09-20 | Discharge: 2021-09-20 | Disposition: A | Discharge status: Home / Self Care | Payer: Medicare Other | Attending: Emergency Medicine | Admitting: Emergency Medicine

## 2021-09-20 DIAGNOSIS — B3731 Acute candidiasis of vulva and vagina: Secondary | ICD-10-CM | POA: Diagnosis present

## 2021-09-20 DIAGNOSIS — N760 Acute vaginitis: Secondary | ICD-10-CM | POA: Insufficient documentation

## 2021-09-20 DIAGNOSIS — B9689 Other specified bacterial agents as the cause of diseases classified elsewhere: Secondary | ICD-10-CM | POA: Diagnosis present

## 2021-09-20 LAB — URINALYSIS, COMPLETE (UACMP) WITH MICROSCOPIC
Bilirubin Urine: NEGATIVE
Glucose, UA: NEGATIVE mg/dL
Hgb urine dipstick: NEGATIVE
Ketones, ur: NEGATIVE mg/dL
Leukocytes,Ua: NEGATIVE
Nitrite: NEGATIVE
Protein, ur: NEGATIVE mg/dL
Specific Gravity, Urine: 1.025 (ref 1.005–1.030)
pH: 6.5 (ref 5.0–8.0)

## 2021-09-20 LAB — WET PREP, GENITAL
Sperm: NONE SEEN
Trich, Wet Prep: NONE SEEN
WBC, Wet Prep HPF POC: <10 — AB (ref <10)

## 2021-09-20 MED ORDER — FLUCONAZOLE 200 MG PO TABS: 200.0000 mg | ORAL_TABLET | Freq: Every day | ORAL | 1 refills | Status: AC

## 2021-09-20 MED ORDER — METRONIDAZOLE 500 MG PO TABS: 500.0000 mg | ORAL_TABLET | Freq: Two times a day (BID) | ORAL | 0 refills | Status: DC

## 2021-09-20 NOTE — ED Provider Notes (Signed)
[facility]    CSN: [id number] Arrival date & time: 09/20/21  [id number]      History   Chief Complaint Chief Complaint  Patient presents with   Urinary Frequency    HPI Melody Stone is a 71 y.o. female.   HPI  71 year old female here for evaluation of urinary complaints.  Patient is here with her caregivers who report that for last 2 days patient has had increasing urinary frequency and her urine has become a darker yellow to yellow-green color.  This has not been associated with any vaginal discharge or complaints of pain with urination.  No fever, nausea or vomiting, low back pain, change in medications, changes in diet, no change in appetite, and no cough.  Caregivers do report an increase in agitation.  Past Medical History:  Diagnosis Date   Anemia    Aortic insufficiency    a. 10/2015 Echo: Nl LV fxn, mild to moderate AI; b. 02/2017 Echo: EF 60-65%, no rwma, mild AI.   Chest pain    a. 10/2015 Ex MV Jefm Bryant): EF 69%, no ischemia; b. 01/2017 Lexiscan MV: EF 45%, no ischemia/infarct-->Low risk.   Dementia ([place])    Dyspnea on exertion    Hepatitis C    HLD (hyperlipidemia)    Hypertension    Mental retardation    Reflux esophagitis     Patient Active Problem List   Diagnosis Date Noted   Dysphagia, oropharyngeal 07/08/2016   Lung nodule, solitary 03/30/2016   Moderate aortic insufficiency 03/30/2016   HCV (hepatitis C virus) 02/19/2016   HLD (hyperlipidemia) 02/19/2016   BP (high blood pressure) 02/19/2016   Intellectual disability 02/19/2016   Esophagitis, reflux 02/19/2016   Breathlessness on exertion 10/27/2015    Past Surgical History:  Procedure Laterality Date   ABDOMINAL HYSTERECTOMY     APPENDECTOMY      OB History   No obstetric history on file.      Home Medications    Prior to Admission medications   Medication Sig Start Date End Date Taking? Authorizing Provider  fluconazole (DIFLUCAN) 200 MG tablet Take 1 tablet (200 mg  total) by mouth daily for 2 doses. 09/20/21 09/22/21 Yes Margarette Canada, NP  metroNIDAZOLE (FLAGYL) 500 MG tablet Take 1 tablet (500 mg total) by mouth 2 (two) times daily. 09/20/21  Yes Margarette Canada, NP  acetaminophen (TYLENOL) 325 MG tablet Take 2 tablets (650 mg total) by mouth every 6 (six) hours as needed. 05/17/21 05/17/22  Triplett, Johnette Abraham B, FNP  Cholecalciferol (VITAMIN D3) 1000 units CAPS Take 2,000 Units by mouth daily.     [provider]  clonazePAM (KLONOPIN) 0.5 MG tablet Take 0.25-0.5 mg by mouth See admin instructions. Take  tablet (0.25mg ) by mouth every morning and 1 tablet (1mg ) by mouth every evening    [provider]  docusate sodium (COLACE) 100 MG capsule Take 100 mg by mouth 2 (two) times daily.    [provider]  donepezil (ARICEPT) 5 MG tablet Take 5 mg by mouth daily. 02/28/20   [provider]  hydrochlorothiazide (HYDRODIURIL) 25 MG tablet Take 25 mg by mouth daily. 02/21/20   [provider]  ibuprofen (ADVIL,MOTRIN) 200 MG tablet Take 600 mg by mouth 2 (two) times daily.    [provider]  loratadine (CLARITIN) 10 MG tablet Take by mouth. 06/25/19 06/24/20  [provider]  losartan (COZAAR) 50 MG tablet Take 1 tablet by mouth daily. 12/20/20   [provider]  memantine (NAMENDA) 10 MG tablet Take 10 mg by mouth 2 (two) times daily. 02/21/20   [provider]  polyethylene glycol (MIRALAX) 17 g packet Take 17 g by mouth daily. 02/04/21   Lucy Chris, PA  potassium chloride 20 MEQ/15ML (10%) SOLN SMARTSIG:Milliliter(s) By Mouth 12/30/20   [provider]  senna (SENOKOT) 8.6 MG tablet Take 2 tablets by mouth daily.    [provider]  venlafaxine XR (EFFEXOR-XR) 37.5 MG 24 hr capsule Take 37.5 mg by mouth daily. 02/21/20   [provider]  verapamil (CALAN-SR) 180 MG CR tablet Take 180 mg by mouth 2 (two) times daily.     [provider]  PARoxetine (PAXIL) 30  MG tablet Take 30 mg by mouth at bedtime.   04/14/20  [provider]    Family History Family History  Problem Relation Age of Onset   Heart attack Mother        CABG in her 4's   Cancer Mother    CAD Brother        49   Hypertension Brother        74    Social History Social History   Tobacco Use   Smoking status: Never   Smokeless tobacco: Never  Vaping Use   Vaping Use: Never used  Substance Use Topics   Alcohol use: No   Drug use: No     Allergies   Other and Ciprofloxacin   Review of Systems Review of Systems  Constitutional:  Negative for fever.  Respiratory:  Negative for cough.   Gastrointestinal:  Negative for abdominal pain, diarrhea, nausea and vomiting.  Genitourinary:  Positive for frequency. Negative for dysuria, hematuria, urgency and vaginal discharge.  Musculoskeletal:  Negative for back pain.  Skin:  Negative for rash.  Hematological: Negative.   Psychiatric/Behavioral: Negative.      Physical Exam Triage Vital Signs ED Triage Vitals [09/20/21 1216]  Enc Vitals Group     BP 140/78     Pulse Rate 84     Resp 18     Temp 97.6 F (36.4 C)     Temp src      SpO2 100 %     Weight      Height      Head Circumference      Peak Flow      Pain Score      Pain Loc      Pain Edu?      Excl. in GC?    No data found.  Updated Vital Signs BP 140/78 (BP Location: Left Arm)    Pulse 84    Temp 97.6 F (36.4 C)    Resp 18    SpO2 100%   Visual Acuity Right Eye Distance:   Left Eye Distance:   Bilateral Distance:    Right Eye Near:   Left Eye Near:    Bilateral Near:     Physical Exam Vitals and nursing note reviewed. Exam conducted with a chaperone present Melody Stone, CMA).  Constitutional:      Appearance: Normal appearance. She is not ill-appearing.  HENT:     Head: Normocephalic and atraumatic.  Cardiovascular:     Rate and Rhythm: Normal rate and regular rhythm.     Pulses: Normal pulses.     Heart sounds: Normal  heart sounds. No murmur heard.   No friction rub. No gallop.  Pulmonary:     Effort: Pulmonary effort is normal.  Breath sounds: Normal breath sounds. No wheezing, rhonchi or rales.  Abdominal:     General: Abdomen is flat.     Palpations: Abdomen is soft.     Tenderness: There is no right CVA tenderness or left CVA tenderness.  Genitourinary:    General: Normal vulva.     Vagina: No vaginal discharge.     Comments: Vaginal introitus is erythematous.  No discharge appreciated. Skin:    General: Skin is warm and dry.     Capillary Refill: Capillary refill takes less than 2 seconds.     Findings: No erythema or rash.  Neurological:     General: No focal deficit present.     Mental Status: She is alert and oriented to person, place, and time.  Psychiatric:        Mood and Affect: Mood normal.        Thought Content: Thought content normal.     UC Treatments / Results  Labs (all labs ordered are listed, but only abnormal results are displayed) Labs Reviewed  WET PREP, GENITAL - Abnormal; Notable for the following components:      Result Value   Yeast Wet Prep HPF POC PRESENT (*)    Clue Cells Wet Prep HPF POC PRESENT (*)    WBC, Wet Prep HPF POC <10 (*)    All other components within normal limits  URINALYSIS, COMPLETE (UACMP) WITH MICROSCOPIC - Abnormal; Notable for the following components:   Bacteria, UA RARE (*)    All other components within normal limits    EKG   Radiology No results found.  Procedures Procedures (including critical care time)  Medications Ordered in UC Medications - No data to display  Initial Impression / Assessment and Plan / UC Course  I have reviewed the triage vital signs and the nursing notes.  Pertinent labs & imaging results that were available during my care of the patient were reviewed by me and considered in my medical decision making (see chart for details).  The patient is a 71 year old female with a history of intellectual  disability here for evaluation due to increased urinary frequency, change in color urine, and increase in agitation.  She is with 2 caregivers who deny any fever, changes to her appetite, any changes in medication, changes to dietary foods, nausea or vomiting, cough, low back pain, complaints of pain with urination, or vaginal discharge.  They are concerned that she may have a UTI.  On exam patient is moderately cooperative with the exam.  She has a benign cardiopulmonary exam with clear lung sounds in all fields.  No CVA tenderness on exam.  Abdomen is soft and nontender.  Urinalysis was collected at triage that shows rare bacteria on microscopy.  No protein, nitrates, leukocyte Estrace, or WBCs noted.  With the erythema noted at the vaginal introitus will swab patient for wet prep given that the urinalysis does not show any signs of infection.  Wet prep is positive for both yeast and clue cells.  I will discharge patient home on metronidazole and Diflucan.  Patient to take the metronidazole twice daily for 7 days.  For the Diflucan she will take 1 tablet now and repeat in 1 week for treatment of her vaginal yeast infection.   Final Clinical Impressions(s) / UC Diagnoses   Final diagnoses:  BV (bacterial vaginosis)  Vaginal yeast infection     Discharge Instructions      Take the Flagyl (metronidazole) 500 mg twice daily for treatment  of your bacterial vaginosis.  Avoid alcohol while on the metronidazole as taken together will cause of vomiting.  Bacterial vaginosis is often caused by a imbalance of bacteria in your vaginal vault.  This is sometimes a result of using tampons or hormonal fluctuations during her menstrual cycle.  You if your symptoms are recurrent you can try using a boric acid suppository twice weekly to help maintain the acid-base balance in your vagina vault which could prevent further infection.  You can also try vaginal probiotics to help return normal bacterial  balance.   Take 1 Diflucan tablet now and repeat in 1 week for treatment of your vaginal yeast infection.     ED Prescriptions     Medication Sig Dispense Auth. Provider   metroNIDAZOLE (FLAGYL) 500 MG tablet Take 1 tablet (500 mg total) by mouth 2 (two) times daily. 14 tablet Margarette Canada, NP   fluconazole (DIFLUCAN) 200 MG tablet Take 1 tablet (200 mg total) by mouth daily for 2 doses. 2 tablet Margarette Canada, NP      PDMP not reviewed this encounter.   Margarette Canada, NP 09/20/21 [phone]

## 2021-09-20 NOTE — ED Triage Notes (Signed)
Patient presents to Urgent Care with her caregiver who reports that pt has urinary freq, strong smell with urine since Saturday. Caretaker concerned with possible UTI.  Denies fever or hematuria.

## 2021-09-20 NOTE — Discharge Instructions (Signed)
Take the Flagyl (metronidazole) 500 mg twice daily for treatment of your bacterial vaginosis.  Avoid alcohol while on the metronidazole as taken together will cause of vomiting.  Bacterial vaginosis is often caused by a imbalance of bacteria in your vaginal vault.  This is sometimes a result of using tampons or hormonal fluctuations during her menstrual cycle.  You if your symptoms are recurrent you can try using a boric acid suppository twice weekly to help maintain the acid-base balance in your vagina vault which could prevent further infection.  You can also try vaginal probiotics to help return normal bacterial balance.   Take 1 Diflucan tablet now and repeat in 1 week for treatment of your vaginal yeast infection.

## 2021-12-28 ENCOUNTER — Encounter: Payer: Self-pay | Admitting: Internal Medicine

## 2021-12-28 ENCOUNTER — Emergency Department: Payer: Medicare Other

## 2021-12-28 ENCOUNTER — Inpatient Hospital Stay
Admission: EM | Admit: 2021-12-28 | Discharge: 2021-12-30 | DRG: 536 | Disposition: A | Discharge status: Home Health | Payer: Medicare Other | Attending: Internal Medicine | Admitting: Internal Medicine

## 2021-12-28 ENCOUNTER — Other Ambulatory Visit: Payer: Self-pay

## 2021-12-28 DIAGNOSIS — F79 Unspecified intellectual disabilities: Secondary | ICD-10-CM | POA: Diagnosis not present

## 2021-12-28 DIAGNOSIS — S0101XA Laceration without foreign body of scalp, initial encounter: Secondary | ICD-10-CM | POA: Diagnosis present

## 2021-12-28 DIAGNOSIS — L89151 Pressure ulcer of sacral region, stage 1: Secondary | ICD-10-CM | POA: Diagnosis not present

## 2021-12-28 DIAGNOSIS — Z8249 Family history of ischemic heart disease and other diseases of the circulatory system: Secondary | ICD-10-CM | POA: Diagnosis not present

## 2021-12-28 DIAGNOSIS — D649 Anemia, unspecified: Secondary | ICD-10-CM | POA: Diagnosis present

## 2021-12-28 DIAGNOSIS — Z79899 Other long term (current) drug therapy: Secondary | ICD-10-CM | POA: Diagnosis not present

## 2021-12-28 DIAGNOSIS — Z23 Encounter for immunization: Secondary | ICD-10-CM | POA: Diagnosis not present

## 2021-12-28 DIAGNOSIS — Z8619 Personal history of other infectious and parasitic diseases: Secondary | ICD-10-CM | POA: Diagnosis not present

## 2021-12-28 DIAGNOSIS — F0390 Unspecified dementia without behavioral disturbance: Secondary | ICD-10-CM | POA: Diagnosis present

## 2021-12-28 DIAGNOSIS — Z881 Allergy status to other antibiotic agents status: Secondary | ICD-10-CM

## 2021-12-28 DIAGNOSIS — W19XXXA Unspecified fall, initial encounter: Secondary | ICD-10-CM

## 2021-12-28 DIAGNOSIS — S32110A Nondisplaced Zone I fracture of sacrum, initial encounter for closed fracture: Secondary | ICD-10-CM | POA: Diagnosis present

## 2021-12-28 DIAGNOSIS — K5909 Other constipation: Secondary | ICD-10-CM | POA: Diagnosis not present

## 2021-12-28 DIAGNOSIS — R296 Repeated falls: Secondary | ICD-10-CM | POA: Diagnosis present

## 2021-12-28 DIAGNOSIS — I10 Essential (primary) hypertension: Secondary | ICD-10-CM | POA: Diagnosis not present

## 2021-12-28 DIAGNOSIS — Z90710 Acquired absence of both cervix and uterus: Secondary | ICD-10-CM

## 2021-12-28 DIAGNOSIS — Y92481 Parking lot as the place of occurrence of the external cause: Secondary | ICD-10-CM | POA: Diagnosis not present

## 2021-12-28 DIAGNOSIS — W1830XA Fall on same level, unspecified, initial encounter: Secondary | ICD-10-CM | POA: Diagnosis present

## 2021-12-28 DIAGNOSIS — E785 Hyperlipidemia, unspecified: Secondary | ICD-10-CM | POA: Diagnosis not present

## 2021-12-28 DIAGNOSIS — R1310 Dysphagia, unspecified: Secondary | ICD-10-CM | POA: Diagnosis present

## 2021-12-28 DIAGNOSIS — Z888 Allergy status to other drugs, medicaments and biological substances status: Secondary | ICD-10-CM

## 2021-12-28 DIAGNOSIS — S32591A Other specified fracture of right pubis, initial encounter for closed fracture: Principal | ICD-10-CM | POA: Diagnosis present

## 2021-12-28 DIAGNOSIS — K2100 Gastro-esophageal reflux disease with esophagitis, without bleeding: Secondary | ICD-10-CM | POA: Diagnosis present

## 2021-12-28 DIAGNOSIS — S32019A Unspecified fracture of first lumbar vertebra, initial encounter for closed fracture: Secondary | ICD-10-CM | POA: Diagnosis present

## 2021-12-28 DIAGNOSIS — S329XXA Fracture of unspecified parts of lumbosacral spine and pelvis, initial encounter for closed fracture: Secondary | ICD-10-CM | POA: Diagnosis present

## 2021-12-28 DIAGNOSIS — Z20822 Contact with and (suspected) exposure to covid-19: Secondary | ICD-10-CM | POA: Diagnosis present

## 2021-12-28 DIAGNOSIS — R4189 Other symptoms and signs involving cognitive functions and awareness: Secondary | ICD-10-CM | POA: Diagnosis not present

## 2021-12-28 DIAGNOSIS — Z9181 History of falling: Secondary | ICD-10-CM

## 2021-12-28 DIAGNOSIS — S32039A Unspecified fracture of third lumbar vertebra, initial encounter for closed fracture: Secondary | ICD-10-CM | POA: Diagnosis not present

## 2021-12-28 DIAGNOSIS — S92591A Other fracture of right lesser toe(s), initial encounter for closed fracture: Secondary | ICD-10-CM | POA: Diagnosis present

## 2021-12-28 LAB — CBC WITH DIFFERENTIAL/PLATELET
Abs Immature Granulocytes: 0.1 10*3/uL — ABNORMAL HIGH (ref 0.00–0.07)
Basophils Absolute: 0.1 10*3/uL (ref 0.0–0.1)
Basophils Relative: 1 %
Eosinophils Absolute: 0.1 10*3/uL (ref 0.0–0.5)
Eosinophils Relative: 1 %
HCT: 32.8 % — ABNORMAL LOW (ref 36.0–46.0)
Hemoglobin: 11.1 g/dL — ABNORMAL LOW (ref 12.0–15.0)
Immature Granulocytes: 1 %
Lymphocytes Relative: 29 %
Lymphs Abs: 2.5 10*3/uL (ref 0.7–4.0)
MCH: 30.1 pg (ref 26.0–34.0)
MCHC: 33.8 g/dL (ref 30.0–36.0)
MCV: 88.9 fL (ref 80.0–100.0)
Monocytes Absolute: 0.6 10*3/uL (ref 0.1–1.0)
Monocytes Relative: 7 %
Neutro Abs: 5.2 10*3/uL (ref 1.7–7.7)
Neutrophils Relative %: 61 %
Platelets: 212 10*3/uL (ref 150–400)
RBC: 3.69 MIL/uL — ABNORMAL LOW (ref 3.87–5.11)
RDW: 13.2 % (ref 11.5–15.5)
WBC: 8.6 10*3/uL (ref 4.0–10.5)
nRBC: 0 % (ref 0.0–0.2)

## 2021-12-28 LAB — BASIC METABOLIC PANEL
Anion gap: 7 (ref 5–15)
BUN: 16 mg/dL (ref 8–23)
CO2: 26 mmol/L (ref 22–32)
Calcium: 9.4 mg/dL (ref 8.9–10.3)
Chloride: 102 mmol/L (ref 98–111)
Creatinine, Ser: 0.67 mg/dL (ref 0.44–1.00)
GFR, Estimated: >60 mL/min (ref >60)
Glucose, Bld: 86 mg/dL (ref 70–99)
Potassium: 4.1 mmol/L (ref 3.5–5.1)
Sodium: 135 mmol/L (ref 135–145)

## 2021-12-28 LAB — URINALYSIS, ROUTINE W REFLEX MICROSCOPIC
Bilirubin Urine: NEGATIVE
Glucose, UA: NEGATIVE mg/dL
Hgb urine dipstick: NEGATIVE
Ketones, ur: NEGATIVE mg/dL
Leukocytes,Ua: NEGATIVE
Nitrite: NEGATIVE
Protein, ur: NEGATIVE mg/dL
Specific Gravity, Urine: 1.01 (ref 1.005–1.030)
pH: 9 — ABNORMAL HIGH (ref 5.0–8.0)

## 2021-12-28 LAB — RESP PANEL BY RT-PCR (FLU A&B, COVID) ARPGX2
Influenza A by PCR: NEGATIVE
Influenza B by PCR: NEGATIVE
SARS Coronavirus 2 by RT PCR: NEGATIVE

## 2021-12-28 LAB — TROPONIN I (HIGH SENSITIVITY): Troponin I (High Sensitivity): 7 ng/L (ref <18)

## 2021-12-28 MED ORDER — LIDOCAINE-EPINEPHRINE 2 %-1:100000 IJ SOLN
20.0000 mL | Freq: Once | INTRAMUSCULAR | Status: AC
  Administered 2021-12-28: 20 mL via INTRADERMAL
  Filled 2021-12-28: qty 1

## 2021-12-28 MED ORDER — ACETAMINOPHEN 650 MG RE SUPP: 650.0000 mg | Freq: Four times a day (QID) | RECTAL | Status: DC | PRN

## 2021-12-28 MED ORDER — TETANUS-DIPHTH-ACELL PERTUSSIS 5-2.5-18.5 LF-MCG/0.5 IM SUSY
0.5000 mL | PREFILLED_SYRINGE | Freq: Once | INTRAMUSCULAR | Status: AC
  Administered 2021-12-28: 0.5 mL via INTRAMUSCULAR
  Filled 2021-12-28: qty 0.5

## 2021-12-28 MED ORDER — ONDANSETRON HCL 4 MG/2ML IJ SOLN
4.0000 mg | Freq: Four times a day (QID) | INTRAMUSCULAR | Status: DC | PRN
Start: 2021-12-28 — End: 2021-12-30

## 2021-12-28 MED ORDER — ACETAMINOPHEN 325 MG PO TABS: 650.0000 mg | ORAL_TABLET | Freq: Four times a day (QID) | ORAL | Status: DC | PRN

## 2021-12-28 MED ORDER — ENOXAPARIN SODIUM 40 MG/0.4ML IJ SOSY
40.0000 mg | PREFILLED_SYRINGE | INTRAMUSCULAR | Status: DC
  Administered 2021-12-28 – 2021-12-29 (×2): 40 mg via SUBCUTANEOUS
  Filled 2021-12-28 (×2): qty 0.4

## 2021-12-28 MED ORDER — PANTOPRAZOLE SODIUM 40 MG PO TBEC
40.0000 mg | DELAYED_RELEASE_TABLET | Freq: Every day | ORAL | Status: DC
  Administered 2021-12-29 – 2021-12-30 (×2): 40 mg via ORAL
  Filled 2021-12-28 (×2): qty 1

## 2021-12-28 MED ORDER — HYDROCODONE-ACETAMINOPHEN 5-325 MG PO TABS
1.0000 | ORAL_TABLET | Freq: Once | ORAL | Status: AC
  Administered 2021-12-28: 1 via ORAL
  Filled 2021-12-28: qty 1

## 2021-12-28 MED ORDER — VENLAFAXINE HCL ER 37.5 MG PO CP24
37.5000 mg | ORAL_CAPSULE | Freq: Every day | ORAL | Status: DC
  Administered 2021-12-29 – 2021-12-30 (×2): 37.5 mg via ORAL
  Filled 2021-12-28 (×2): qty 1

## 2021-12-28 MED ORDER — ONDANSETRON HCL 4 MG PO TABS: 4.0000 mg | ORAL_TABLET | Freq: Four times a day (QID) | ORAL | Status: DC | PRN

## 2021-12-28 MED ORDER — POLYETHYLENE GLYCOL 3350 17 G PO PACK
17.0000 g | PACK | Freq: Every day | ORAL | Status: DC
  Administered 2021-12-29 – 2021-12-30 (×2): 17 g via ORAL
  Filled 2021-12-28 (×2): qty 1

## 2021-12-28 MED ORDER — HYDROCODONE-ACETAMINOPHEN 5-325 MG PO TABS: 1.0000 | ORAL_TABLET | ORAL | Status: DC | PRN

## 2021-12-28 MED ORDER — DOCUSATE SODIUM 100 MG PO CAPS: 200.0000 mg | ORAL_CAPSULE | Freq: Every day | ORAL | Status: DC

## 2021-12-28 NOTE — Discharge Instructions (Signed)
Staples should be removed in 10 to 14 days.  It is okay to shower.  Pat the area dry.  No swimming.  You can apply antibiotic ointment over the wound to help with healing, twice a day. ?

## 2021-12-28 NOTE — ED Notes (Signed)
Captured EKG; artifact noted as even though pt educated to stay as still as possible she continues to move some.  ?

## 2021-12-28 NOTE — ED Provider Notes (Addendum)
? ?[facility] ?Provider Note ? ? ? Event Date/Time  ? First MD Initiated Contact with Patient 12/28/21 [phone]   ?  (approximate) ? ? ?History  ? ?Fall (Pt from [employer] with fall in parking lot. Pt at baseline mentation wise and denies any pain. Pt did hit head, bleeding controlled PTA and no blood thinners noted on paperwork. EMS reports mechanical fall with no LOC. Per EMS pt normally ambulates but is not putting weight on L side since fall. ) ? ? ?HPI ? ?Melody Stone is a 71 y.o. female with past medical history of hepatitis, hypertension, hyperlipidemia, cognitive impairment, dementia, here with reported fall.  Patient reportedly fell walking in the nursing facility this morning.  Patient reportedly walks almost unassisted at baseline.  There was no reported loss of consciousness.  Patient initially complained of some mild left hip pain, but has been moving her leg spontaneously.  She is unable to provide much history but does not endorse any areas of pain.  No known recent medication changes.  No known recent illnesses per staff at the facility.  Remainder of history limited due to cognitive impairment and dementia. ? ?Level 5 caveat invoked as remainder of history, ROS, and physical exam limited due to patient's cognitive impairment. ? ?  ? ? ?Physical Exam  ? ?Triage Vital Signs: ?ED Triage Vitals  ?Enc Vitals Group  ?   BP 12/28/21 0906 (!) 148/66  ?   Pulse Rate 12/28/21 0906 60  ?   Resp 12/28/21 0906 16  ?   Temp 12/28/21 0906 98.2 ?F (36.8 ?C)  ?   Temp src --   ?   SpO2 12/28/21 0906 98 %  ?   Weight 12/28/21 0916 143 lb 4.8 oz (65 kg)  ?   Height 12/28/21 0916 5\' 6"  (1.676 m)  ?   Head Circumference --   ?   Peak Flow --   ?   Pain Score --   ?   Pain Loc --   ?   Pain Edu? --   ?   Excl. in GC? --   ? ? ?Most recent vital signs: ?Vitals:  ? 12/28/21 0906 12/28/21 1000  ?BP: (!) 148/66 130/64  ?Pulse: 60 62  ?Resp: 16 17  ?Temp: 98.2 ?F (36.8 ?C)   ?SpO2: 98% 98%   ? ? ? ?General: Awake, no distress.  ?CV:  Good peripheral perfusion. RRR. ?Resp:  Normal effort. Lungs CTAB. ?Abd:  No distention. No tenderness. No rebound or guarding. ?Other:  No TTP or swelling b/l UE. Mild TTP over L hip but able to log roll leg with minimal apparent pain. No shortening. MAE with 5/5 strength. Withdraws to touch bl UE and LE. No midline C,T,L spine TTP. Healing bruises noted to buttocks area. No open wounds. ? ? ?ED Results / Procedures / Treatments  ? ?Labs ?(all labs ordered are listed, but only abnormal results are displayed) ?Labs Reviewed  ?CBC WITH DIFFERENTIAL/PLATELET - Abnormal; Notable for the following components:  ?    Result Value  ? RBC 3.69 (*)   ? Hemoglobin 11.1 (*)   ? HCT 32.8 (*)   ? Abs Immature Granulocytes 0.10 (*)   ? All other components within normal limits  ?URINALYSIS, ROUTINE W REFLEX MICROSCOPIC - Abnormal; Notable for the following components:  ? Color, Urine YELLOW (*)   ? APPearance CLOUDY (*)   ? pH 9.0 (*)   ? All other components  within normal limits  ?BASIC METABOLIC PANEL  ?TROPONIN I (HIGH SENSITIVITY)  ? ? ? ?EKG ?Normal sinus rhythm, ventricular rate 58.  QRS 93, QTc 477.  No acute ST elevation or depressions.  No EKG evidence of acute ischemia or infarct. ? ? ?RADIOLOGY ?CT Head/C Spine: NAICA ?CXR: Clear ?XR Hip: Negative ? ? ?I also independently reviewed and agree with radiologist interpretations. ? ? ?PROCEDURES: ? ?Critical Care performed: No ? ? ?Marland Kitchen..Laceration Repair ? ?Date/Time: 12/28/2021 9:52 AM ?Performed by: Shaune PollackIsaacs, Lakiya Cottam, MD ?Authorized by: Shaune PollackIsaacs, Blayde Bacigalupi, MD  ? ?Consent:  ?  Consent obtained:  Verbal ?  Consent given by:  Patient ?  Risks discussed:  Infection, need for additional repair, pain, tendon damage, retained foreign body, vascular damage, poor cosmetic result, poor wound healing and nerve damage ?  Alternatives discussed:  Referral and delayed treatment ?Anesthesia:  ?  Anesthesia method:  Local infiltration ?  Local  anesthetic:  Lidocaine 1% WITH epi ?Laceration details:  ?  Location:  Scalp ?  Length (cm):  2 ?Pre-procedure details:  ?  Preparation:  Patient was prepped and draped in usual sterile fashion and imaging obtained to evaluate for foreign bodies ?Exploration:  ?  Hemostasis achieved with:  Direct pressure ?  Wound exploration: wound explored through full range of motion   ?  Contaminated: no   ?Treatment:  ?  Area cleansed with:  Betadine ?  Amount of cleaning:  Extensive ?  Irrigation solution:  Sterile water ?  Irrigation method:  Pressure wash ?Skin repair:  ?  Repair method:  Staples ?  Number of staples:  4 ?Approximation:  ?  Approximation:  Close ?Repair type:  ?  Repair type:  Simple ?Post-procedure details:  ?  Dressing:  Antibiotic ointment ?  Procedure completion:  Tolerated well, no immediate complications ? ? ?MEDICATIONS ORDERED IN ED: ?Medications  ?Tdap (BOOSTRIX) injection 0.5 mL (0.5 mLs Intramuscular Given 12/28/21 0939)  ?lidocaine-EPINEPHrine (XYLOCAINE W/EPI) 2 %-1:100000 (with pres) injection 20 mL (20 mLs Intradermal Given by Other 12/28/21 0941)  ? ? ? ?IMPRESSION / MDM / ASSESSMENT AND PLAN / ED COURSE  ?I reviewed the triage vital signs and the nursing notes. ? ?MDM:  ?71 year old female with history of cognitive impairment here with mechanical fall.  Patient is overall nontoxic and well-appearing.  CT of the head and C-spine showed no acute abnormalities.  Chest x-ray is clear.  Plain films of the hip are negative.  Laceration repaired at bedside.  Patient tolerated the procedure well.  Tetanus updated.  Will discharge with return precautions and outpatient follow-up.  Patient is otherwise at her mental baseline according to the group home staff, who was updated on her course of care and plan for discharge.  Staple removal in 10 to 14 days. ? ?Addendum: While trying to ambulate for discharge, patient had significant right hip pain.  She was able to range her hip, but the tenderness now seems  more so related to her pelvis.  CT subsequently obtained and shows superior and inferior pubic ramus fracture as well as sacral ala fracture.  She remains hemodynamically stable.  There is a read of trace hemorrhage near the bladder but patient has no hematuria, she is hemodynamically stable, I do not suspect ongoing bleeding.  Will plan to admit for pain control and physical therapy.  Patient caregiver updated. ? ? ?MEDICATIONS GIVEN IN ED: ?Medications  ?Tdap (BOOSTRIX) injection 0.5 mL (0.5 mLs Intramuscular Given 12/28/21 0939)  ?lidocaine-EPINEPHrine (XYLOCAINE W/EPI)  2 %-1:100000 (with pres) injection 20 mL (20 mLs Intradermal Given by Other 12/28/21 0941)  ? ? ? ?Consults:  ?Discussed case with OR nurse for Dr. Allena Katz, orthopedics ? ? ?EMR reviewed  ? ? ? ? ? ?FINAL CLINICAL IMPRESSION(S) / ED DIAGNOSES  ? ?Final diagnoses:  ?Fall, initial encounter  ?Laceration of scalp, initial encounter  ? ? ? ?Rx / DC Orders  ? ?ED Discharge Orders   ? ? None  ? ?  ? ? ? ?Note:  This document was prepared using Dragon voice recognition software and may include unintentional dictation errors. ?  ?Shaune Pollack, MD ?12/28/21 1137 ? ?  ?Shaune Pollack, MD ?12/28/21 1454 ? ?

## 2021-12-28 NOTE — Progress Notes (Signed)
Full consult note to follow tomorrow. ? ?Called by ED staff. Imaging reviewed.  ?-Patient with inferior and superior pubic rami fractures and sacral ala fracture on the right.  No plan for surgical intervention given that these are generally stable fractures. ?-PT/OT when able to tolerate.  Weightbearing as tolerated on right lower extremity. ? ?

## 2021-12-28 NOTE — Progress Notes (Signed)
Moves self in bed  ?

## 2021-12-28 NOTE — Care Management Obs Status (Signed)
MEDICARE OBSERVATION STATUS NOTIFICATION ? ? ?Patient Details  ?Name: Melody Stone ?MRN: [id number] ?Date of Birth: 12/24/1950 ? ? ?Medicare Observation Status Notification Given:  Yes ? ? ? ?Allayne Butcher, RN ?12/28/2021, 3:25 PM ?

## 2021-12-28 NOTE — Plan of Care (Signed)

## 2021-12-28 NOTE — TOC Initial Note (Signed)
Transition of Care (TOC) - Initial/Assessment Note  ? ? ?Patient Details  ?Name: Melody Stone ?MRN: [id number] ?Date of Birth: [birth year] ? ?Transition of Care (TOC) CM/SW Contact:    ?Shelbie Hutching, RN ?Phone Number: ?12/28/2021, 3:38 PM ? ?Clinical Narrative:                 ?Patient brought to the hospital after falling in the parking lot.  Patient does have a pubic ramus fracture and is having pain with movement.  She will stay in observation overnight for pain management and PT and OT. ?RNCM spoke with patient's brother Kendry Puthoff, he is her legal guardian.  She lives at [facility] group home on [address] in [place].  Patient is ambulatory at baseline and active she goes to workshops Monday - Friday with Mer[facility].   ?RNCM spoke with Elouise Munroe director over at [facility] [phone] and updated him on plan of care.   ?If patient is recommended for SNF brother is in agreement.   ? ?Expected Discharge Plan: [place] ?Barriers to Discharge: Continued Medical Work up ? ? ?Patient Goals and CMS Choice ?Patient states their goals for this hospitalization and ongoing recovery are:: Brother- guardian would like for the patinet to get whatever she needs- including SNF if recommended ?CMS Medicare.gov Compare Post Acute Care list provided to:: Legal Guardian ?Choice offered to / list presented to : [facility] POA / Guardian ? ?Expected Discharge Plan and Services ?Expected Discharge Plan: [place] ?  ?Discharge Planning Services: CM Consult ?  ?Living arrangements for the past 2 months: Group Home ?                ?  ?  ?  ?  ?  ?  ?  ?  ?  ?  ? ?Prior Living Arrangements/Services ?Living arrangements for the past 2 months: Group Home ?Lives with:: Facility Resident ?Patient language and need for interpreter reviewed:: Yes ?Do you feel safe going back to the place where you live?: Yes      ?Need for Family Participation in Patient Care: Yes (Comment) ?Care giver support system in  place?: Yes (comment) ?Current home services: DME (rolling walker) ?Criminal Activity/Legal Involvement Pertinent to Current Situation/Hospitalization: No - Comment as needed ? ?Activities of Daily Living ?  ?  ? ?Permission Sought/Granted ?Permission sought to share information with : Case Manager, Family Supports, Customer service manager ?Permission granted to share information with : Yes, Verbal Permission Granted ? Share Information with NAME: Konrad Dolores Porcelli ? Permission granted to share info w AGENCY: [facility]- Elouise Munroe- [phone] ? Permission granted to share info w Relationship: brother/Legal guardian ? Permission granted to share info w Contact Information: [phone] ? ?Emotional Assessment ?  ?  ?  ?Orientation: : Oriented to Self, Oriented to Place ?Alcohol / Substance Use: Not Applicable ?Psych Involvement: No (comment) ? ?Admission diagnosis:  Pubic ramus fracture, right, closed, initial encounter ([place]) [S32.591A] ?Patient Active Problem List  ? Diagnosis Date Noted  ? Pubic ramus fracture, right, closed, initial encounter ([place]) 12/28/2021  ? Dysphagia, oropharyngeal 07/08/2016  ? Lung nodule, solitary 03/30/2016  ? Moderate aortic insufficiency 03/30/2016  ? HCV (hepatitis C virus) 02/19/2016  ? HLD (hyperlipidemia) 02/19/2016  ? BP (high blood pressure) 02/19/2016  ? Intellectual disability 02/19/2016  ? Esophagitis, reflux 02/19/2016  ? Breathlessness on exertion 10/27/2015  ? ?PCP:  Rusty Aus, MD ?Pharmacy:   ?[facility]  [place] ?[address] ?[place] [address] ?Phone: [phone] Fax: [phone] ? ?[place], [address] ?[place] [address] ?Phone: [phone] Fax: [phone] ? ? ? ? ?Social Determinants of Health (SDOH) Interventions ?  ? ?Readmission Risk Interventions ?   ? View : No data to display.  ?  ?  ?  ? ? ? ?

## 2021-12-28 NOTE — H&P (Signed)
? ?History and Physical:  ? ? ?Melody Stone  ? ?GHW:[id number] DOB: [birth year] DOA: 12/28/2021 ? ?Referring MD/provider: Shaune Pollack, MD ?PCP: Danella Penton, MD  ? ?Patient coming from: Group home ? ?Chief Complaint: Fall and inability to walk ? ?History of Present Illness:  ? ?Melody Stone is a 71 y.o. female with medical history significant for dementia, mental retardation, hepatitis C, hypertension, hyperlipidemia, reflux esophagitis, dysphagia, aortic insufficiency, chronic anemia, unsteady gait at baseline with history of falls, who was brought to the hospital, from the group home, because of fall and inability to walk.  History was obtained from [facility], private caregiver at the bedside, because patient has mental retardation and confusion.  According to [facility], patient has unsteady gait at baseline and typically has to walk with assistance because of high risk for falls.  She has followed multiple times in the past.  She she fell today but she could not bear weight on her legs after the fall so she was brought to the hospital for further management. ? ?ED Course:  The patient was given Norco for pain.  She was also given a Tdap vaccine in the emergency room.  Initial vital signs in the ED were unremarkable. ? ?ROS:  ? ?ROS unable to obtain because of mental retardation/confusion ? ? ?Past Medical History:  ? ?Past Medical History:  ?Diagnosis Date  ? Anemia   ? Aortic insufficiency   ? a. 10/2015 Echo: Nl LV fxn, mild to moderate AI; b. 02/2017 Echo: EF 60-65%, no rwma, mild AI.  ? Chest pain   ? a. 10/2015 Ex MV [facility]): EF 69%, no ischemia; b. 01/2017 Lexiscan MV: EF 45%, no ischemia/infarct-->Low risk.  ? Dementia (HCC)   ? Dyspnea on exertion   ? Hepatitis C   ? HLD (hyperlipidemia)   ? Hypertension   ? Mental retardation   ? Reflux esophagitis   ? ? ?Past Surgical History:  ? ?Past Surgical History:  ?Procedure Laterality Date  ? ABDOMINAL HYSTERECTOMY    ? APPENDECTOMY    ? ? ?Social History:   ? ?Social History  ? ?Socioeconomic History  ? Marital status: Single  ?  Spouse name: Not on file  ? Number of children: Not on file  ? Years of education: Not on file  ? Highest education level: Not on file  ?Occupational History  ? Not on file  ?Tobacco Use  ? Smoking status: Never  ? Smokeless tobacco: Never  ?Vaping Use  ? Vaping Use: Never used  ?Substance and Sexual Activity  ? Alcohol use: No  ? Drug use: No  ? Sexual activity: Not on file  ?Other Topics Concern  ? Not on file  ?Social History Narrative  ? Patient lives in a group home.  She participates in activities regularly.  Her exercise tolerance has fallen off over the past year.  She is no longer routinely exercising/going for walks.  ? ?Social Determinants of Health  ? ?Financial Resource Strain: Not on file  ?Food Insecurity: Not on file  ?Transportation Needs: Not on file  ?Physical Activity: Not on file  ?Stress: Not on file  ?Social Connections: Not on file  ?Intimate Partner Violence: Not on file  ? ? ?Allergies  ? ?Other and Ciprofloxacin ? ?Family history:  ? ?Family History  ?Problem Relation Age of Onset  ? Heart attack Mother   ?     CABG in her 50's  ? Cancer Mother   ? CAD Brother   ?  67  ? Hypertension Brother   ?     [id number]  ? ? ?Current Medications:  ? ?Prior to Admission medications   ?Medication Sig Start Date End Date Taking? Authorizing Provider  ?acetaminophen (TYLENOL) 325 MG tablet Take 2 tablets (650 mg total) by mouth every 6 (six) hours as needed. 05/17/21 05/17/22  Chinita Pesterriplett, Cari B, FNP  ?Cholecalciferol (VITAMIN D3) 1000 units CAPS Take 2,000 Units by mouth daily.     [provider]  ?clonazePAM (KLONOPIN) 0.5 MG tablet Take 0.25-0.5 mg by mouth See admin instructions. Take ? tablet (0.25mg ) by mouth every morning and 1 tablet (1mg ) by mouth every evening    [provider]  ?docusate sodium (COLACE) 100 MG capsule Take 100 mg by mouth 2 (two) times daily.    [provider]  ?donepezil (ARICEPT)  5 MG tablet Take 5 mg by mouth daily. 02/28/20   [provider]  ?hydrochlorothiazide (HYDRODIURIL) 25 MG tablet Take 25 mg by mouth daily. 02/21/20   [provider]  ?ibuprofen (ADVIL,MOTRIN) 200 MG tablet Take 600 mg by mouth 2 (two) times daily.    [provider]  ?loratadine (CLARITIN) 10 MG tablet Take by mouth. 06/25/19 06/24/20  [provider]  ?losartan (COZAAR) 50 MG tablet Take 1 tablet by mouth daily. 12/20/20   [provider]  ?memantine (NAMENDA) 10 MG tablet Take 10 mg by mouth 2 (two) times daily. 02/21/20   [provider]  ?metroNIDAZOLE (FLAGYL) 500 MG tablet Take 1 tablet (500 mg total) by mouth 2 (two) times daily. 09/20/21   Becky Augustayan, Jeremy, NP  ?polyethylene glycol (MIRALAX) 17 g packet Take 17 g by mouth daily. 02/04/21   Lucy Chrisodgers, Caitlin J, PA  ?potassium chloride 20 MEQ/15ML (10%) SOLN SMARTSIG:Milliliter(s) By Mouth 12/30/20   [provider]  ?senna (SENOKOT) 8.6 MG tablet Take 2 tablets by mouth daily.    [provider]  ?venlafaxine XR (EFFEXOR-XR) 37.5 MG 24 hr capsule Take 37.5 mg by mouth daily. 02/21/20   [provider]  ?verapamil (CALAN-SR) 180 MG CR tablet Take 180 mg by mouth 2 (two) times daily.     [provider]  ?PARoxetine (PAXIL) 30 MG tablet Take 30 mg by mouth at bedtime.   04/14/20  [provider]  ? ? ?Physical Exam:  ? ?Vitals:  ? 12/28/21 1000 12/28/21 1230 12/28/21 1500 12/28/21 1517  ?BP: 130/64 126/74 (!) 179/78 (!) 142/74  ?Pulse: 62 68 (!) 105 68  ?Resp: 17 16  16   ?Temp:  98 ?F (36.7 ?C)    ?TempSrc:  Oral    ?SpO2: 98% 100% 100% 100%  ?Weight:      ?Height:      ? ? ? ?Physical Exam: ?Blood pressure (!) 142/74, pulse 68, temperature 98 ?F (36.7 ?C), temperature source Oral, resp. rate 16, height 5\' 6"  (1.676 m), weight 65 kg, SpO2 100 %. ?Gen: No acute distress. ?Head: Normocephalic, atraumatic. ?Eyes: Pupils equal, round and reactive to light. Extraocular movements  intact.  Sclerae nonicteric.  ?Mouth: Moist mucous membranes ?Neck: Supple, no thyromegaly, no lymphadenopathy, no jugular venous distention. ?Chest: Lungs are clear to auscultation with good air movement. No rales, rhonchi or wheezes.  ?CV: Heart sounds are regular with an S1, S2. No murmurs, rubs or gallops.  ?Abdomen: Soft, nontender, nondistended with normal active bowel sounds. No palpable masses. ?Extremities: Extremities are without clubbing, or cyanosis. No edema. Pedal pulses 2+.  Limited range of movement of right hip. ?  Skin: Bruises on the right posterior distal thigh about the right knee, left buttock.  Stage I coccygeal decubitus ulcer.  Warm and dry.  ?Neuro: Alert and oriented times 2 (person and place); grossly nonfocal.  Speech is slurred at baseline and difficult to understand. ?Psych: Poor insight and judgment. ? ? ?Data Review:  ? ? ?Labs: ?Basic Metabolic Panel: ?Recent Labs  ?Lab 12/28/21 ?0923  ?NA 135  ?K 4.1  ?CL 102  ?CO2 26  ?GLUCOSE 86  ?BUN 16  ?CREATININE 0.67  ?CALCIUM 9.4  ? ?Liver Function Tests: ?No results for input(s): AST, ALT, ALKPHOS, BILITOT, PROT, ALBUMIN in the last 168 hours. ?No results for input(s): LIPASE, AMYLASE in the last 168 hours. ?No results for input(s): AMMONIA in the last 168 hours. ?CBC: ?Recent Labs  ?Lab 12/28/21 ?0923  ?WBC 8.6  ?NEUTROABS 5.2  ?HGB 11.1*  ?HCT 32.8*  ?MCV 88.9  ?PLT 212  ? ?Cardiac Enzymes: ?No results for input(s): CKTOTAL, CKMB, CKMBINDEX, TROPONINI in the last 168 hours. ? ?BNP (last 3 results) ?No results for input(s): PROBNP in the last 8760 hours. ?CBG: ?No results for input(s): GLUCAP in the last 168 hours. ? ?Urinalysis ?   ?Component Value Date/Time  ? COLORURINE YELLOW (A) 12/28/2021 0923  ? APPEARANCEUR CLOUDY (A) 12/28/2021 0923  ? LABSPEC 1.010 12/28/2021 0923  ? PHURINE 9.0 (H) 12/28/2021 3154  ? GLUCOSEU NEGATIVE 12/28/2021 0923  ? HGBUR NEGATIVE 12/28/2021 0923  ? BILIRUBINUR NEGATIVE 12/28/2021 0923  ? KETONESUR NEGATIVE  12/28/2021 0086  ? PROTEINUR NEGATIVE 12/28/2021 0923  ? NITRITE NEGATIVE 12/28/2021 0923  ? LEUKOCYTESUR NEGATIVE 12/28/2021 7619  ? ? ? ? ?Radiographic Studies: ?DG Chest 1 View ? ?Result Date: 12/28/2021 ?CLINIC

## 2021-12-29 DIAGNOSIS — F0390 Unspecified dementia without behavioral disturbance: Secondary | ICD-10-CM | POA: Diagnosis present

## 2021-12-29 DIAGNOSIS — Z8619 Personal history of other infectious and parasitic diseases: Secondary | ICD-10-CM | POA: Diagnosis not present

## 2021-12-29 DIAGNOSIS — S32039A Unspecified fracture of third lumbar vertebra, initial encounter for closed fracture: Secondary | ICD-10-CM | POA: Diagnosis present

## 2021-12-29 DIAGNOSIS — D649 Anemia, unspecified: Secondary | ICD-10-CM | POA: Diagnosis present

## 2021-12-29 DIAGNOSIS — I10 Essential (primary) hypertension: Secondary | ICD-10-CM | POA: Diagnosis present

## 2021-12-29 DIAGNOSIS — K5909 Other constipation: Secondary | ICD-10-CM | POA: Diagnosis present

## 2021-12-29 DIAGNOSIS — E785 Hyperlipidemia, unspecified: Secondary | ICD-10-CM | POA: Diagnosis present

## 2021-12-29 DIAGNOSIS — W19XXXA Unspecified fall, initial encounter: Secondary | ICD-10-CM | POA: Diagnosis not present

## 2021-12-29 DIAGNOSIS — Z8249 Family history of ischemic heart disease and other diseases of the circulatory system: Secondary | ICD-10-CM | POA: Diagnosis not present

## 2021-12-29 DIAGNOSIS — Z23 Encounter for immunization: Secondary | ICD-10-CM | POA: Diagnosis present

## 2021-12-29 DIAGNOSIS — Z20822 Contact with and (suspected) exposure to covid-19: Secondary | ICD-10-CM | POA: Diagnosis present

## 2021-12-29 DIAGNOSIS — S32110A Nondisplaced Zone I fracture of sacrum, initial encounter for closed fracture: Secondary | ICD-10-CM | POA: Diagnosis present

## 2021-12-29 DIAGNOSIS — W1830XA Fall on same level, unspecified, initial encounter: Secondary | ICD-10-CM | POA: Diagnosis present

## 2021-12-29 DIAGNOSIS — Z888 Allergy status to other drugs, medicaments and biological substances status: Secondary | ICD-10-CM | POA: Diagnosis not present

## 2021-12-29 DIAGNOSIS — S329XXA Fracture of unspecified parts of lumbosacral spine and pelvis, initial encounter for closed fracture: Secondary | ICD-10-CM | POA: Diagnosis present

## 2021-12-29 DIAGNOSIS — L89151 Pressure ulcer of sacral region, stage 1: Secondary | ICD-10-CM | POA: Diagnosis present

## 2021-12-29 DIAGNOSIS — F79 Unspecified intellectual disabilities: Secondary | ICD-10-CM | POA: Diagnosis present

## 2021-12-29 DIAGNOSIS — S92591A Other fracture of right lesser toe(s), initial encounter for closed fracture: Secondary | ICD-10-CM | POA: Diagnosis present

## 2021-12-29 DIAGNOSIS — R296 Repeated falls: Secondary | ICD-10-CM | POA: Diagnosis present

## 2021-12-29 DIAGNOSIS — Z9181 History of falling: Secondary | ICD-10-CM | POA: Diagnosis not present

## 2021-12-29 DIAGNOSIS — S32019A Unspecified fracture of first lumbar vertebra, initial encounter for closed fracture: Secondary | ICD-10-CM | POA: Diagnosis present

## 2021-12-29 DIAGNOSIS — Z881 Allergy status to other antibiotic agents status: Secondary | ICD-10-CM | POA: Diagnosis not present

## 2021-12-29 DIAGNOSIS — R1310 Dysphagia, unspecified: Secondary | ICD-10-CM | POA: Diagnosis present

## 2021-12-29 DIAGNOSIS — S32591A Other specified fracture of right pubis, initial encounter for closed fracture: Secondary | ICD-10-CM | POA: Diagnosis present

## 2021-12-29 DIAGNOSIS — Z79899 Other long term (current) drug therapy: Secondary | ICD-10-CM | POA: Diagnosis not present

## 2021-12-29 DIAGNOSIS — R4189 Other symptoms and signs involving cognitive functions and awareness: Secondary | ICD-10-CM | POA: Diagnosis present

## 2021-12-29 DIAGNOSIS — Y92481 Parking lot as the place of occurrence of the external cause: Secondary | ICD-10-CM | POA: Diagnosis not present

## 2021-12-29 DIAGNOSIS — S0101XA Laceration without foreign body of scalp, initial encounter: Secondary | ICD-10-CM | POA: Diagnosis present

## 2021-12-29 DIAGNOSIS — Z90710 Acquired absence of both cervix and uterus: Secondary | ICD-10-CM | POA: Diagnosis not present

## 2021-12-29 MED ORDER — ACETAMINOPHEN 325 MG PO TABS
650.0000 mg | ORAL_TABLET | Freq: Four times a day (QID) | ORAL | Status: DC
  Administered 2021-12-29 – 2021-12-30 (×4): 650 mg via ORAL
  Filled 2021-12-29 (×4): qty 2

## 2021-12-29 MED ORDER — ACETAMINOPHEN 650 MG RE SUPP
650.0000 mg | Freq: Four times a day (QID) | RECTAL | Status: DC
  Administered 2021-12-30: 650 mg via RECTAL
  Filled 2021-12-29: qty 1

## 2021-12-29 NOTE — Progress Notes (Signed)
Pt needs a great amount of encouragement to get out of bed with caregiver that works with her from the assisted living facility.  Caregiver states the patient works best with people that she is familiar with.  Pt doesn't acknowledge hospital staff without great amount of encouragement, staff unable to get pt to be cooperative to get out of bed before her caregiver came to see her.  Caregiver and PT were able to get pt to walk a few steps out of room before she needed to sit and go back to bed. ?

## 2021-12-29 NOTE — TOC Initial Note (Signed)
Transition of Care (TOC) - Initial/Assessment Note  ? ? ?Patient Details  ?Name: Melody Stone ?MRN: [id number] ?Date of Birth: 04/07/1951 ? ?Transition of Care (TOC) CM/SW Contact:    ?Velma Hanna A Vibhav Waddill, LCSW ?Phone Number: ?12/29/2021, 2:27 PM ? ?Clinical Narrative:    CSW spoke with pt's brother   [name] who works at [employer] group home in regards to pt returning with home health. [name] states they typically use [facility]. Referral provided to [facility] with [facility] and they will service pt.           ? ? ?Expected Discharge Plan: Home w Home Health Services ?Barriers to Discharge: Continued Medical Work up ? ? ?Patient Goals and CMS Choice ?Patient states their goals for this hospitalization and ongoing recovery are:: for pt to get better ?CMS Medicare.gov Compare Post Acute Care list provided to:: Legal Guardian ?Choice offered to / list presented to : [facility] POA / Guardian ? ?Expected Discharge Plan and Services ?Expected Discharge Plan: Home w Home Health Services ?In-house Referral: Clinical Social Work ?Discharge Planning Services: CM Consult ?  ?Living arrangements for the past 2 months: Group Home ?                ?  ?  ?  ?  ?  ?  ?  ?  ?  ?  ? ?Prior Living Arrangements/Services ?Living arrangements for the past 2 months: Group Home ?Lives with:: Facility Resident ?Patient language and need for interpreter reviewed:: Yes ?Do you feel safe going back to the place where you live?: Yes      ?Need for Family Participation in Patient Care: Yes (Comment) ?Care giver support system in place?: Yes (comment) ?Current home services: DME (rolling walker) ?Criminal Activity/Legal Involvement Pertinent to Current Situation/Hospitalization: No - Comment as needed ? ?Activities of Daily Living ?Home Assistive Devices/Equipment: Other (Comment) (documented no medical equipment used by patient) ?ADL Screening (condition at time of admission) ?Patient's cognitive ability adequate to safely complete daily  activities?: No ?Is the patient deaf or have difficulty hearing?: No ?Does the patient have difficulty seeing, even when wearing glasses/contacts?: No ?Does the patient have difficulty concentrating, remembering, or making decisions?: Yes ?Patient able to express need for assistance with ADLs?: No ?Does the patient have difficulty dressing or bathing?: Yes ?Independently performs ADLs?: No ?Communication: Needs assistance ?Is this a change from baseline?: Pre-admission baseline ?Dressing (OT): Needs assistance ?Is this a change from baseline?: Pre-admission baseline ?Grooming: Needs assistance ?Is this a change from baseline?: Pre-admission baseline ?Feeding: Needs assistance ?Is this a change from baseline?: Pre-admission baseline ?Bathing: Needs assistance ?Is this a change from baseline?: Pre-admission baseline ?Toileting: Needs assistance ?Is this a change from baseline?: Pre-admission baseline ?In/Out Bed: Needs assistance ?Is this a change from baseline?: Pre-admission baseline ?Walks in Home: Needs assistance ?Is this a change from baseline?: Pre-admission baseline ?Does the patient have difficulty walking or climbing stairs?: Yes ?Weakness of Legs: Left ?Weakness of Arms/Hands: None ? ?Permission Sought/Granted ?Permission sought to share information with : Family Supports ?Permission granted to share information with : Yes, Verbal Permission Granted ? Share Information with NAME: william ? Permission granted to share info w AGENCY: [facility]- [phone] ? Permission granted to share info w Relationship: legal guardian ? Permission granted to share info w Contact Information: [phone] ? ?Emotional Assessment ?Appearance:: Appears stated age ?Attitude/Demeanor/Rapport: Unable to Assess ?Affect (typically observed): Unable to Assess ?Orientation: : Oriented to Self ?Alcohol / Substance  Use: Not Applicable ?Psych Involvement: No (comment) ? ?Admission diagnosis:  Fall, initial encounter  [W19.XXXA] ?Laceration of scalp, initial encounter [S01.01XA] ?Pubic ramus fracture, right, closed, initial encounter (HCC) [S32.591A] ?Pelvic fracture (HCC) [S32.9XXA] ?Patient Active Problem List  ? Diagnosis Date Noted  ? Pelvic fracture (HCC) 12/29/2021  ? Fall   ? Pubic ramus fracture, right, closed, initial encounter (HCC) 12/28/2021  ? Dysphagia, oropharyngeal 07/08/2016  ? Lung nodule, solitary 03/30/2016  ? Moderate aortic insufficiency 03/30/2016  ? HCV (hepatitis C virus) 02/19/2016  ? HLD (hyperlipidemia) 02/19/2016  ? BP (high blood pressure) 02/19/2016  ? Intellectual disability 02/19/2016  ? Esophagitis, reflux 02/19/2016  ? Breathlessness on exertion 10/27/2015  ? ?PCP:  Danella Penton, MD ?Pharmacy:   ?[facility] - [place], [place] - [address] ?[address] ?[place] [address] ?Phone: [phone] Fax: [phone] ? ?[facility] - [place], [place] - [address] ?[address] ? [place] [address] ?Phone: [phone] Fax: [phone] ? ? ? ? ?Social Determinants of Health (SDOH) Interventions ?  ? ?Readmission Risk Interventions ?   ? View : No data to display.  ?  ?  ?  ? ? ? ?

## 2021-12-29 NOTE — Evaluation (Signed)
Occupational Therapy Evaluation ?Patient Details ?Name: Melody Stone ?MRN: [id number] ?DOB: [birth year] ?Today's Date: 12/29/2021 ? ? ?History of Present Illness Melody Stone is a 71 y.o. female with an inferior and superior pubic rami fractures and sacral ala fracture on the right following a mechanical fall. PMHx includes dementia, mental retardation, hepatitis C, hypertension, hyperlipidemia, reflux esophagitis, dysphagia, aortic insufficiency, chronic anemia, unsteady gait at baseline, with history of falls.  ? ?Clinical Impression ?  ?Pt was seen for OT evaluation this date. Prior to hospital admission, pt required Mod A for ADL performance and has an extensive falls history. Pt lives at a group home with 24/7 assistance and attends a sheltered workshop/day program at [facility] during the day Mon-Fri. Pt is largely non-verbal at baseline, during today's evaluation she is unable to communicate her level of pain but is reluctant to attempt OOB movement; however, she rolls easily in bed without displaying any signs of discomfort. Pt is significantly consoled by having individuals she knows in the room with her and becomes agitated with strangers. Therefore, recommend DC to pt's group home setting. The home's director communicates that they can provide increased staff coverage for several weeks to meet any new pt needs. Given that pt is dependent for ADL care at baseline, no further OT services necessary at this time. If pt does not have access to a RW (unclear based on conversation with group home staff), then recommend placing an order for rolling walker.     ? ?Recommendations for follow up therapy are one component of a multi-disciplinary discharge planning process, led by the attending physician.  Recommendations may be updated based on patient status, additional functional criteria and insurance authorization.  ? ?Follow Up Recommendations ? Other (comment) (Return to group home setting, no OT  follow up)  ?  ?Assistance Recommended at Discharge Frequent or constant Supervision/Assistance  ?Patient can return home with the following A lot of help with walking and/or transfers;A lot of help with bathing/dressing/bathroom;Direct supervision/assist for medications management;Assistance with feeding;Assist for transportation;Assistance with cooking/housework;Direct supervision/assist for financial management;Help with stairs or ramp for entrance ? ?  ?Functional Status Assessment ? Patient has had a recent decline in their functional status and/or demonstrates limited ability to make significant improvements in function in a reasonable and predictable amount of time  ?Equipment Recommendations ? Other (comment) (RW)  ?  ?Recommendations for Other Services   ? ? ?  ?Precautions / Restrictions Precautions ?Precautions: Fall ?Restrictions ?Weight Bearing Restrictions: No  ? ?  ? ?Mobility Bed Mobility ?Overal bed mobility: Needs Assistance ?Bed Mobility: Rolling ?Rolling: Supervision ?  ?  ?  ?  ?General bed mobility comments: Pt able to roll in bed to assist with peri-care ?  ? ?Transfers ?Overall transfer level: Needs assistance ?Equipment used: 1 person hand held assist ?  ?  ?  ?  ?  ?  ?  ?General transfer comment: Pt becomes upset with transfer attempts ?  ? ?  ?Balance Overall balance assessment: Needs assistance ?Sitting-balance support: Bilateral upper extremity supported ?Sitting balance-Leahy Scale: Poor ?  ?  ?  ?Standing balance-Leahy Scale: Zero ?  ?  ?  ?  ?  ?  ?  ?  ?  ?  ?  ?  ?   ? ?ADL either performed or assessed with clinical judgement  ? ?ADL Overall ADL's : Needs assistance/impaired ?  ?  ?  ?  ?  ?  ?  ?  ?  ?  ?  ?  ?  ?  ?  Toileting- Clothing Manipulation and Hygiene: Bed level;Moderate assistance ?  ?  ?  ?  ?   ? ? ? ?Vision   ?   ?   ?Perception   ?  ?Praxis   ?  ? ?Pertinent Vitals/Pain Pain Assessment ?Pain Assessment: PAINAD ?Breathing: normal ?Negative Vocalization: occasional  moan/groan, low speech, negative/disapproving quality ?Facial Expression: sad, frightened, frown ?Body Language: relaxed ?Consolability: distracted or reassured by voice/touch ?PAINAD Score: 3  ? ? ? ?Hand Dominance   ?  ?Extremity/Trunk Assessment Upper Extremity Assessment ?Upper Extremity Assessment: Generalized weakness ?  ?Lower Extremity Assessment ?Lower Extremity Assessment: Generalized weakness ?  ?  ?  ?Communication Communication ?Communication: Expressive difficulties ?  ?Cognition Arousal/Alertness: Awake/alert ?Behavior During Therapy: Flat affect ?Overall Cognitive Status: History of cognitive impairments - at baseline ?  ?  ?  ?  ?  ?  ?  ?  ?  ?  ?  ?  ?  ?  ?  ?  ?  ?  ?  ?General Comments    ? ?  ?Exercises Other Exercises ?Other Exercises: Discussion with group home staff re: pt's PLOF, level of support available, DC recs ?  ?Shoulder Instructions    ? ? ?Home Living Family/patient expects to be discharged to:: Group home ?  ?  ?  ?  ?  ?  ?  ?  ?  ?  ?  ?  ?  ?  ?  ?  ?Additional [facility] [place] ?  ? ?  ?Prior Functioning/Environment Prior Level of Function : Needs assist ?  ?  ?  ?  ?  ?  ?Mobility Comments: ambulates w/o AD but with multiple falls history ?ADLs Comments: requires assistance for all ADLs ?  ? ?  ?  ?OT Problem List: Impaired balance (sitting and/or standing);Decreased cognition;Decreased range of motion ?  ?   ?OT Treatment/Interventions:    ?  ?OT Goals(Current goals can be found in the care plan section) Acute Rehab OT Goals ?Patient Stated Goal: to help pt return ASAP to group day program at [facility] ?OT Goal Formulation: With family (w/ group home staff) ?Time For Goal Achievement: 01/12/22 ?Potential to Achieve Goals: Good  ?OT Frequency:   ?  ? ?Co-evaluation   ?  ?  ?  ?  ? ?  ?AM-PAC OT "6 Clicks" Daily Activity     ?Outcome Measure Help from another person eating meals?: A Little ?Help from another person taking care of personal grooming?: A  Lot ?Help from another person toileting, which includes using toliet, bedpan, or urinal?: A Lot ?Help from another person bathing (including washing, rinsing, drying)?: A Lot ?Help from another person to put on and taking off regular upper body clothing?: A Lot ?Help from another person to put on and taking off regular lower body clothing?: A Lot ?6 Click Score: 13 ?  ?End of Session   ? ?Activity Tolerance: Other (comment) (Treatment limited 2/2 pt's cognitive function, level of engagement) ?Patient left: in bed;with family/visitor present;with bed alarm set;with call bell/phone within reach ? ?OT Visit Diagnosis: Muscle weakness (generalized) (M62.81);Repeated falls (R29.6);Other abnormalities of gait and mobility (R26.89);Other symptoms and signs involving cognitive function  ?              ?Time: [id number] ?OT Time Calculation (min): 20 min ?Charges:  OT General Charges ?$OT Visit: 1 Visit ?OT Evaluation ?$OT Eval Moderate Complexity: 1 Mod ?OT Treatments ?$Self Care/Home Management :  8-22 mins ?Latina Craver, PhD, MS, OTR/L ?12/29/21, 12:41 PM ? ?

## 2021-12-29 NOTE — Plan of Care (Signed)
Patient slept well. Only complains of pain when moving her to her left side. Patient follows command. Vital signs stable no other issues during shift,  ?Problem: Clinical Measurements: ?Goal: Ability to maintain clinical measurements within normal limits will improve ?Outcome: Progressing ?Goal: Will remain free from infection ?Outcome: Progressing ?Goal: Diagnostic test results will improve ?Outcome: Progressing ?Goal: Respiratory complications will improve ?Outcome: Progressing ?Goal: Cardiovascular complication will be avoided ?Outcome: Progressing ?  ?Problem: Elimination: ?Goal: Will not experience complications related to bowel motility ?Outcome: Progressing ?Goal: Will not experience complications related to urinary retention ?Outcome: Progressing ?  ?

## 2021-12-29 NOTE — Evaluation (Signed)
Physical Therapy Evaluation ?Patient Details ?Name: Melody Stone ?MRN: [id number] ?DOB: 03-01-1951 ?Today's Date: 12/29/2021 ? ?History of Present Illness ? Melody Stone is a 71 y.o. female with an inferior and superior pubic rami fractures and sacral ala fracture on the right following a mechanical fall. PMHx includes dementia, mental retardation, hepatitis C, hypertension, hyperlipidemia, reflux esophagitis, dysphagia, aortic insufficiency, chronic anemia, unsteady gait at baseline, with history of falls.  ?Clinical Impression ? Pt's caregiver from group home here and facilitating interaction and movement as pt struggled very much with any meaningful communication to encourage mobility, much less WBing/ambulation.  Difficult to know really how she compared to her baseline/normal on this front but she did do 2 bouts of ambulation with inconsistent walker use (improved with direct hand over hand cuing and plenty of encouragement).  She is not at her baseline but clearly does better with those she is familiar with and as she was able to do some ambulation and will likely improve daily as pain decreases.  Will benefit from getting a walker for home as well as HHPT. ?   ? ?Recommendations for follow up therapy are one component of a multi-disciplinary discharge planning process, led by the attending physician.  Recommendations may be updated based on patient status, additional functional criteria and insurance authorization. ? ?Follow Up Recommendations Home health PT (at her group home) ? ?  ?Assistance Recommended at Discharge None  ?Patient can return home with the following ? A little help with walking and/or transfers;A little help with bathing/dressing/bathroom;Assistance with cooking/housework;Direct supervision/assist for medications management;Assist for transportation ? ?  ?Equipment Recommendations None recommended by PT  ?Recommendations for Other Services ?    ?  ?Functional Status Assessment Patient has  had a recent decline in their functional status and demonstrates the ability to make significant improvements in function in a reasonable and predictable amount of time.  ? ?  ?Precautions / Restrictions Precautions ?Precautions: Fall ?Restrictions ?Weight Bearing Restrictions: No  ? ?  ? ?Mobility ? Bed Mobility ?Overal bed mobility: Needs Assistance ?Bed Mobility: Supine to Sit ?  ?  ?Supine to sit: Min assist ?  ?  ?General bed mobility comments: Pt appeared to have the strength and ROM to roll w/o assist, however difficult time convincing her to fully engage with the effort, ultimately got to sitting with light assist to initiate getting to upright EOB ?  ? ?Transfers ?Overall transfer level: Needs assistance ?Equipment used: Rolling walker (2 wheels) ?Transfers: Sit to/from Stand ?Sit to Stand: Min assist ?  ?  ?  ?  ?  ?General transfer comment: 3 sit to stand efforts t/o the session.  Each one requiring heavy cuing/encouragement and repeated reinforcement.  She did not self initiate attempt to rise and needed direct assist to initiate upward motion, she seemed to be able to continue the momentum with less assist once started. ?  ? ?Ambulation/Gait ?Ambulation/Gait assistance: Min guard, Min assist ?  ?Assistive device: Rolling walker (2 wheels) ?  ?  ?  ?  ?General Gait Details: Pt struggled to initiate movement but with minimal direct forward walker movement assist she was able to step with AD and maintain balance and take a few steps (with R limp) at a time before becoming distracted, taking hand off walker/reaching to her face/mouth (baseline habit) and vague c/o with no overt pain indication.  After rest break we attempted another bout of ambulation, again needing assist to advance walker but able to step to  it w/o direct assist - however anxiety/pain? seemed to be a barrier for her to do more and ultimately we sat back down after ~10 ft ? ?Stairs ?  ?  ?  ?  ?  ? ?Wheelchair Mobility ?  ? ?Modified  Rankin (Stroke Patients Only) ?  ? ?  ? ?Balance Overall balance assessment: Needs assistance ?Sitting-balance support: Bilateral upper extremity supported ?Sitting balance-Leahy Scale: Fair ?  ?  ?Standing balance support: Bilateral upper extremity supported ?Standing balance-Leahy Scale: Fair ?Standing balance comment: Pt did not have any LOBs but indicates fear of falling multiple times during standing tasks t/o session. History of falls including the one that led to this fracture ?  ?  ?  ?  ?  ?  ?  ?  ?  ?  ?  ?   ? ? ? ?Pertinent Vitals/Pain Pain Assessment ?Pain Assessment: Faces ?Faces Pain Scale: Hurts even more ?Pain Location: pt unable to report or localize pain but during movement and WBing indicated pain with intermittent groaning and stopping activity  ? ? ?Home Living Family/patient expects to be discharged to:: Group home ?  ?  ?  ?  ?  ?  ?  ?  ?  ?Additional Comments: [facility] in [place]  ?  ?Prior Function Prior Level of Function : Needs assist ?  ?  ?  ?  ?  ?  ?Mobility Comments: ambulates w/o AD but with multiple falls history - apparently staff keeping an eye on her but not having to help much ?ADLs Comments: requires assistance for all ADLs ?  ? ? ?Hand Dominance  ?   ? ?  ?Extremity/Trunk Assessment  ? Upper Extremity Assessment ?Upper Extremity Assessment: Generalized weakness;Difficult to assess due to impaired cognition ?  ? ?Lower Extremity Assessment ?Lower Extremity Assessment: Generalized weakness;Difficult to assess due to impaired cognition ?  ? ?   ?Communication  ? Communication: Expressive difficulties (minimal verbalizations, difficult to gather/share info)  ?Cognition Arousal/Alertness: Awake/alert ?Behavior During Therapy: Restless, Anxious ?Overall Cognitive Status: History of cognitive impairments - at baseline ?  ?  ?  ?  ?  ?  ?  ?  ?  ?  ?  ?  ?  ?  ?  ?  ?  ?  ?  ? ?  ?General Comments   ? ?  ?Exercises Other Exercises ?Other Exercises: attempted to get her  to participate with basic LE exercises, but very difficult to get her to actually perform  ? ?Assessment/Plan  ?  ?PT Assessment Patient needs continued PT services  ?PT Problem List Decreased strength;Decreased range of motion;Decreased activity tolerance;Decreased balance;Decreased mobility;Decreased knowledge of use of DME;Decreased safety awareness;Pain ? ?   ?  ?PT Treatment Interventions Gait training;Stair training;Therapeutic activities;Therapeutic exercise;Balance training;Patient/family education   ? ?PT Goals (Current goals can be found in the Care Plan section)  ?Acute Rehab PT Goals ?Patient Stated Goal: go home ?PT Goal Formulation: With patient ?Time For Goal Achievement: 01/12/22 ?Potential to Achieve Goals: Fair ? ?  ?Frequency Min 2X/week ?  ? ? ?Co-evaluation   ?  ?  ?  ?  ? ? ?  ?AM-PAC PT "6 Clicks" Mobility  ?Outcome Measure Help needed turning from your back to your side while in a flat bed without using bedrails?: A Little ?Help needed moving from lying on your back to sitting on the side of a flat bed without using bedrails?: A Little ?Help needed moving  to and from a bed to a chair (including a wheelchair)?: A Lot ?Help needed standing up from a chair using your arms (e.g., wheelchair or bedside chair)?: A Lot ?Help needed to walk in hospital room?: A Lot ?Help needed climbing 3-5 steps with a railing? : Total ?6 Click Score: 13 ? ?  ?End of Session Equipment Utilized During Treatment: Gait belt ?Activity Tolerance: Other (comment) (Pt needed a lot of cuing/encouragement (not too far from baseline per [facility] staff) and did seem to indicate some pain with WBing.) ?Patient left: with chair alarm set;with call bell/phone within reach;with family/visitor present ?Nurse Communication: Mobility status ?PT Visit Diagnosis: Difficulty in walking, not elsewhere classified (R26.2);Unsteadiness on feet (R26.81);History of falling (Z91.81);Pain;Muscle weakness (generalized) (M62.81) ?Pain - Right/Left:  Right ?Pain - part of body: Hip ?  ? ?Time: [id number] ?PT Time Calculation (min) (ACUTE ONLY): 35 min ? ? ?Charges:   PT Evaluation ?$PT Eval Low Complexity: 1 Low ?PT Treatments ?$Gait Training: 8-22 mins

## 2021-12-29 NOTE — Consult Note (Signed)
ORTHOPAEDIC CONSULTATION ? ?REQUESTING PHYSICIAN: Leeroy Bock, MD ? ?Chief Complaint:   ?Pelvic rami fx + sacral ala fx ? ?History of Present Illness: ?History obtained from review of medical record and discussion with ED staff as patient as dementia. ? ?Makina Skow is a 71 y.o. female medical history significant for dementia, mental retardation, hepatitis C, hypertension, hyperlipidemia, reflux esophagitis, dysphagia, aortic insufficiency, chronic anemia, unsteady gait at baseline with history of falls, who was brought to the hospital, from the group home, because of fall and inability to walk. Patient has unsteady gait and walks with assistance at baseline. She was brought to ED after being unable to bear weight.  Imaging in ED showed R superior/inferior pubic rami fractures and minimally displaced R sacral ala fracture. ? ?Past Medical History:  ?Diagnosis Date  ? Anemia   ? Aortic insufficiency   ? a. 10/2015 Echo: Nl LV fxn, mild to moderate AI; b. 02/2017 Echo: EF 60-65%, no rwma, mild AI.  ? Chest pain   ? a. 10/2015 Ex MV [facility]): EF 69%, no ischemia; b. 01/2017 Lexiscan MV: EF 45%, no ischemia/infarct-->Low risk.  ? Dementia (HCC)   ? Dyspnea on exertion   ? Hepatitis C   ? HLD (hyperlipidemia)   ? Hypertension   ? Mental retardation   ? Reflux esophagitis   ? ?Past Surgical History:  ?Procedure Laterality Date  ? ABDOMINAL HYSTERECTOMY    ? APPENDECTOMY    ? ?Social History  ? ?Socioeconomic History  ? Marital status: Single  ?  Spouse name: Not on file  ? Number of children: Not on file  ? Years of education: Not on file  ? Highest education level: Not on file  ?Occupational History  ? Not on file  ?Tobacco Use  ? Smoking status: Never  ? Smokeless tobacco: Never  ?Vaping Use  ? Vaping Use: Never used  ?Substance and Sexual Activity  ? Alcohol use: No  ? Drug use: No  ? Sexual activity: Not on file  ?Other Topics Concern  ? Not on  file  ?Social History Narrative  ? Patient lives in a group home.  She participates in activities regularly.  Her exercise tolerance has fallen off over the past year.  She is no longer routinely exercising/going for walks.  ? ?Social Determinants of Health  ? ?Financial Resource Strain: Not on file  ?Food Insecurity: Not on file  ?Transportation Needs: Not on file  ?Physical Activity: Not on file  ?Stress: Not on file  ?Social Connections: Not on file  ? ?Family History  ?Problem Relation Age of Onset  ? Heart attack Mother   ?     CABG in her 92's  ? Cancer Mother   ? CAD Brother   ?     45  ? Hypertension Brother   ?     3  ? ?Allergies  ?Allergen Reactions  ? Other Other (See Comments)  ?  Psychotropic drugs cause kidney failure. ?Psychotropic drug cause renal failure. ?Psychotropic drugs cause kidney failure.  ? Ciprofloxacin Rash  ?  Per caregiver.  ? ?Prior to Admission medications   ?Medication Sig Start Date End Date Taking? Authorizing Provider  ?B Complex-C-Folic Acid (B-COMPLEX/FOLIC ACID/VITAMIN C) TBCR Take 1 tablet by mouth daily. 07/21/21  Yes [provider]  ?chlorhexidine (PERIDEX) 0.12 % solution SMARTSIG:By Mouth 12/02/21  Yes [provider]  ?Cholecalciferol (VITAMIN D3) 1000 units CAPS Take 2,000 Units by mouth daily.    Yes [provider]  ?clonazePAM (KLONOPIN) 0.5 MG tablet Take 0.25-0.5 mg by mouth See admin instructions. Take ? tablet (0.25mg ) by mouth every morning and 1 tablet (1mg ) by mouth every evening   Yes [provider]  ?divalproex (DEPAKOTE) 125 MG DR tablet Take 125 mg by mouth 2 (two) times daily. 11/19/21  Yes [provider]  ?docusate sodium (COLACE) 100 MG capsule Take 200 mg by mouth daily.   Yes [provider]  ?donepezil (ARICEPT) 5 MG tablet Take 5 mg by mouth daily. 02/28/20  Yes [provider]  ?losartan (COZAAR) 50 MG tablet Take 1 tablet by mouth daily. 12/20/20  Yes [provider]  ?memantine  (NAMENDA) 10 MG tablet Take 10 mg by mouth 2 (two) times daily. 02/21/20  Yes [provider]  ?Olopatadine HCl 0.2 % SOLN Place 1 drop into both eyes daily.   Yes [provider]  ?pantoprazole (PROTONIX) 40 MG tablet Take 1 tablet by mouth daily. 02/09/21 02/09/22 Yes [provider]  ?polyethylene glycol (MIRALAX) 17 g packet Take 17 g by mouth daily. 02/04/21  Yes 04/06/21, PA  ?potassium chloride 20 MEQ/15ML (10%) SOLN SMARTSIG:Milliliter(s) By Mouth 12/30/20  Yes [provider]  ?SYSTANE BALANCE 0.6 % SOLN Apply 1 drop to eye 4 (four) times daily. 11/24/21  Yes [provider]  ?venlafaxine XR (EFFEXOR-XR) 37.5 MG 24 hr capsule Take 37.5 mg by mouth daily. 02/21/20  Yes [provider]  ?verapamil (CALAN-SR) 180 MG CR tablet Take 180 mg by mouth 2 (two) times daily.    Yes [provider]  ?acetaminophen (TYLENOL) 325 MG tablet Take 2 tablets (650 mg total) by mouth every 6 (six) hours as needed. 05/17/21 05/17/22  05/19/22, FNP  ?hydrochlorothiazide (HYDRODIURIL) 25 MG tablet Take 25 mg by mouth daily. ?Patient not taking: Reported on 12/28/2021 02/21/20   [provider]  ?ibuprofen (ADVIL,MOTRIN) 200 MG tablet Take 600 mg by mouth 2 (two) times daily.    [provider]  ?loratadine (CLARITIN) 10 MG tablet Take by mouth. 06/25/19 06/24/20  [provider]  ?metroNIDAZOLE (FLAGYL) 500 MG tablet Take 1 tablet (500 mg total) by mouth 2 (two) times daily. ?Patient not taking: Reported on 12/28/2021 09/20/21   09/22/21, NP  ?senna (SENOKOT) 8.6 MG tablet Take 2 tablets by mouth daily.    [provider]  ?PARoxetine (PAXIL) 30 MG tablet Take 30 mg by mouth at bedtime.   04/14/20  [provider]  ? ?Recent Labs  ?  12/28/21 ?0923  ?WBC 8.6  ?HGB 11.1*  ?HCT 32.8*  ?PLT 212  ?K 4.1  ?CL 102  ?CO2 26  ?BUN 16  ?CREATININE 0.67  ?GLUCOSE 86  ?CALCIUM 9.4  ? ?DG Chest 1 View ? ?Result Date:  12/28/2021 ?CLINICAL DATA:  Trauma, fall EXAM: CHEST  1 VIEW COMPARISON:  02/04/2021 FINDINGS: The heart size and mediastinal contours are within normal limits. Both lungs are clear. The visualized skeletal structures are unremarkable. IMPRESSION: No active disease. Electronically Signed   By: 04/06/2021 M.D.   On: 12/28/2021 09:53  ? ?DG Tibia/Fibula Right ? ?Result Date: 12/28/2021 ?CLINICAL DATA:  Right leg pain after fall. EXAM: RIGHT TIBIA AND FIBULA - 2 VIEW COMPARISON:  None Available. FINDINGS: There is no evidence of fracture or other focal bone lesions. Soft tissues are unremarkable. IMPRESSION: Negative. Electronically Signed   By: 02/27/2022 M.D.   On: 12/28/2021 13:40  ? ?CT  HEAD WO CONTRAST (5MM) ? ?Result Date: 12/28/2021 ?CLINICAL DATA:  Head trauma, moderate-severe; Neck trauma (Age >= 65y) EXAM: CT HEAD WITHOUT CONTRAST CT CERVICAL SPINE WITHOUT CONTRAST TECHNIQUE: Multidetector CT imaging of the head and cervical spine was performed following the standard protocol without intravenous contrast. Multiplanar CT image reconstructions of the cervical spine were also generated. RADIATION DOSE REDUCTION: This exam was performed according to the departmental dose-optimization program which includes automated exposure control, adjustment of the mA and/or kV according to patient size and/or use of iterative reconstruction technique. COMPARISON:  03/04/2021. FINDINGS: CT HEAD FINDINGS Brain: no evidence of acute infarction, hemorrhage, hydrocephalus, extra-axial collection or mass lesion/mass effect. Similar atrophy and chronic microvascular ischemic disease. Vascular: No hyperdense vessel identified. Calcific intracranial atherosclerosis. Skull: No acute fracture. Sinuses/Orbits: Clear sinuses.  No acute orbital findings. Other: No mastoid effusions. CT CERVICAL SPINE FINDINGS Alignment: Reversal of the normal cervical lordosis. Slight anterolisthesis of C4 on C5, probably degenerative facet  arthropathy at this level. Otherwise, no substantial sagittal subluxation. Skull base and vertebrae: Vertebral body heights are maintained. No evidence of acute fracture. Osteopenia. Soft tissues and spinal canal: No pre

## 2021-12-29 NOTE — Progress Notes (Signed)
?PROGRESS NOTE ? ?Melody Stone    DOB: Oct 20, 1950, 71 y.o.  ?[id number]  ?  Code Status: Full Code   ?DOA: 12/28/2021   LOS: 0  ? ?Brief hospital course  ?Melody Stone is a 71 y.o. female with a PMH significant for dementia, cognitive delay, hepatitis C, HTN, HLD, GERD, dysphagia, aortic insufficiency, chronic anemia, frequent falls. ? ?They presented from group home to the ED on 12/28/2021 with fall and subsequent inability to walk.  At baseline, she walks with assistance. ? ?In the ED, it was found that they had stable vital signs.  ?Significant findings included overall negative imaging results including hip, femur, tibia/fibula x-rays, head and neck CT.  However, she did have finding of acute minimally displaced and impacted fracture on right parasymphyseal superior and inferior pubic rami.  Ortho surgery was consulted for evaluation. ? ?They were treated with analgesia and not weightbearing.  ?Patient was admitted to medicine service for further workup and management of pelvic fracture as outlined in detail below. ? ?12/29/21 -stable ? ?Assessment & Plan  ?Principal Problem: ?  Pubic ramus fracture, right, closed, initial encounter ([place]) ? ?Pubic rami fracture, right, closed-patient appears to be resting comfortably in bed favoring her right side.  She is unable to tell me if she is having any pain. ?-Ortho surgery following, appreciate recommendations ? -No surgical intervention at this time ? -PT/OT ? -Weightbearing as tolerated ?-Analgesia scheduled as patient is unable to verbalize her needs ? ?Dementia  cognitive delay-chronic, stable ?-Continue home medications ? ?HTN  HLD-chronic, ?-Continue home medications ? ?Body mass index is 23.13 kg/m?. ? ?VTE ppx: enoxaparin (LOVENOX) injection 40 mg Start: 12/28/21 2200 ? ? ?Diet:  ?   ?Diet  ? DIET - DYS 1 Room service appropriate? Yes; Fluid consistency: Thin  ? ?Consultants: ?Ortho surgery  ?Subjective 12/29/21   ? ?Pt unable to provide history. She ate  her breakfast well. ?  ?Objective  ? ?Vitals:  ? 12/28/21 2025 12/28/21 2343 12/29/21 0443 12/29/21 0700  ?BP: (!) 129/52 129/67 (!) 145/62 (!) 146/64  ?Pulse: 93 86 75 85  ?Resp: 20 20 17 18   ?Temp: 98.9 ?F (37.2 ?C) 98.3 ?F (36.8 ?C) 98.8 ?F (37.1 ?C) 98.1 ?F (36.7 ?C)  ?TempSrc:   Oral Oral  ?SpO2: 98% 100% 99% 100%  ?Weight:      ?Height:      ? ?No intake or output data in the 24 hours ending 12/29/21 0805 ?Filed Weights  ? 12/28/21 0916  ?Weight: 65 kg  ?  ? ?Physical Exam:  ?General: awake, alert, NAD ?HEENT: atraumatic, clear conjunctiva, anicteric sclera, MMM ?Respiratory: normal respiratory effort. ?Cardiovascular: quick capillary refill ?Extremities: neg LE edema ?Skin: dry, intact, normal temperature, normal color. No rashes, lesions or ulcers on exposed skin ? ?Labs   ?I have personally reviewed the following labs and imaging studies ?CBC ?   ?Component Value Date/Time  ? WBC 8.6 12/28/2021 0923  ? RBC 3.69 (L) 12/28/2021 [id number]  ? HGB 11.1 (L) 12/28/2021 [id number]  ? HCT 32.8 (L) 12/28/2021 [id number]  ? PLT 212 12/28/2021 0923  ? MCV 88.9 12/28/2021 0923  ? MCH 30.1 12/28/2021 0923  ? MCHC 33.8 12/28/2021 0923  ? RDW 13.2 12/28/2021 0923  ? LYMPHSABS 2.5 12/28/2021 0923  ? MONOABS 0.6 12/28/2021 0923  ? EOSABS 0.1 12/28/2021 0923  ? BASOSABS 0.1 12/28/2021 [id number]  ? ? ?  Latest Ref Rng & Units 12/28/2021  ?  9:23 AM 05/17/2021  ?  4:43 PM 02/04/2021  ?  4:07 PM  ?BMP  ?Glucose 70 - 99 mg/dL 86   111   92    ?BUN 8 - 23 mg/dL 16   17   17     ?Creatinine 0.44 - 1.00 mg/dL 0.67   0.69   0.73    ?Sodium 135 - 145 mmol/L 135   139   135    ?Potassium 3.5 - 5.1 mmol/L 4.1   4.0   3.8    ?Chloride 98 - 111 mmol/L 102   101   102    ?CO2 22 - 32 mmol/L 26   27   24     ?Calcium 8.9 - 10.3 mg/dL 9.4   9.5   9.3    ? ? ?DG Chest 1 View ? ?Result Date: 12/28/2021 ?CLINICAL DATA:  Trauma, fall EXAM: CHEST  1 VIEW COMPARISON:  02/04/2021 FINDINGS: The heart size and mediastinal contours are within normal limits. Both lungs are clear.  The visualized skeletal structures are unremarkable. IMPRESSION: No active disease. Electronically Signed   By: Elmer Picker M.D.   On: 12/28/2021 09:53  ? ?DG Tibia/Fibula Right ? ?Result Date: 12/28/2021 ?CLINICAL DATA:  Right leg pain after fall. EXAM: RIGHT TIBIA AND FIBULA - 2 VIEW COMPARISON:  None Available. FINDINGS: There is no evidence of fracture or other focal bone lesions. Soft tissues are unremarkable. IMPRESSION: Negative. Electronically Signed   By: Marijo Conception M.D.   On: 12/28/2021 13:40  ? ?CT HEAD WO CONTRAST (5MM) ? ?Result Date: 12/28/2021 ?CLINICAL DATA:  Head trauma, moderate-severe; Neck trauma (Age >= 65y) EXAM: CT HEAD WITHOUT CONTRAST CT CERVICAL SPINE WITHOUT CONTRAST TECHNIQUE: Multidetector CT imaging of the head and cervical spine was performed following the standard protocol without intravenous contrast. Multiplanar CT image reconstructions of the cervical spine were also generated. RADIATION DOSE REDUCTION: This exam was performed according to the departmental dose-optimization program which includes automated exposure control, adjustment of the mA and/or kV according to patient size and/or use of iterative reconstruction technique. COMPARISON:  03/04/2021. FINDINGS: CT HEAD FINDINGS Brain: no evidence of acute infarction, hemorrhage, hydrocephalus, extra-axial collection or mass lesion/mass effect. Similar atrophy and chronic microvascular ischemic disease. Vascular: No hyperdense vessel identified. Calcific intracranial atherosclerosis. Skull: No acute fracture. Sinuses/Orbits: Clear sinuses.  No acute orbital findings. Other: No mastoid effusions. CT CERVICAL SPINE FINDINGS Alignment: Reversal of the normal cervical lordosis. Slight anterolisthesis of C4 on C5, probably degenerative facet arthropathy at this level. Otherwise, no substantial sagittal subluxation. Skull base and vertebrae: Vertebral body heights are maintained. No evidence of acute fracture. Osteopenia. Soft  tissues and spinal canal: No prevertebral fluid or swelling. No visible canal hematoma. Disc levels: Severe right C1-C2 degenerative change. Otherwise, mild to moderate multilevel degenerative disease. Upper chest: Visualized lung apices are clear. IMPRESSION: CT head: 1. No evidence of acute intracranial abnormality. 2. Chronic microvascular disease and cerebral atrophy (ICD10-G31.9). CT cervical spine: 1. No evidence of acute fracture or traumatic malalignment. 2. Multilevel degenerative change. Electronically Signed   By: Margaretha Sheffield M.D.   On: 12/28/2021 10:49  ? ?CT Cervical Spine Wo Contrast ? ?Result Date: 12/28/2021 ?CLINICAL DATA:  Head trauma, moderate-severe; Neck trauma (Age >= 65y) EXAM: CT HEAD WITHOUT CONTRAST CT CERVICAL SPINE WITHOUT CONTRAST TECHNIQUE: Multidetector CT imaging of the head and cervical spine was performed following the standard protocol without intravenous contrast. Multiplanar CT image reconstructions of the cervical spine were also generated. RADIATION DOSE REDUCTION: This exam was  performed according to the departmental dose-optimization program which includes automated exposure control, adjustment of the mA and/or kV according to patient size and/or use of iterative reconstruction technique. COMPARISON:  03/04/2021. FINDINGS: CT HEAD FINDINGS Brain: no evidence of acute infarction, hemorrhage, hydrocephalus, extra-axial collection or mass lesion/mass effect. Similar atrophy and chronic microvascular ischemic disease. Vascular: No hyperdense vessel identified. Calcific intracranial atherosclerosis. Skull: No acute fracture. Sinuses/Orbits: Clear sinuses.  No acute orbital findings. Other: No mastoid effusions. CT CERVICAL SPINE FINDINGS Alignment: Reversal of the normal cervical lordosis. Slight anterolisthesis of C4 on C5, probably degenerative facet arthropathy at this level. Otherwise, no substantial sagittal subluxation. Skull base and vertebrae: Vertebral body heights  are maintained. No evidence of acute fracture. Osteopenia. Soft tissues and spinal canal: No prevertebral fluid or swelling. No visible canal hematoma. Disc levels: Severe right C1-C2 degenerative change.

## 2021-12-30 DIAGNOSIS — S32591A Other specified fracture of right pubis, initial encounter for closed fracture: Secondary | ICD-10-CM | POA: Diagnosis not present

## 2021-12-30 MED ORDER — HYDROCODONE-ACETAMINOPHEN 5-325 MG PO TABS: 1.0000 | ORAL_TABLET | ORAL | 0 refills | Status: DC | PRN

## 2021-12-30 MED ORDER — VERAPAMIL HCL ER 180 MG PO TBCR
180.0000 mg | EXTENDED_RELEASE_TABLET | Freq: Two times a day (BID) | ORAL | Status: DC
  Administered 2021-12-30: 180 mg via ORAL
  Filled 2021-12-30: qty 1

## 2021-12-30 MED ORDER — LOSARTAN POTASSIUM 50 MG PO TABS
50.0000 mg | ORAL_TABLET | Freq: Every day | ORAL | Status: DC
  Administered 2021-12-30: 50 mg via ORAL
  Filled 2021-12-30: qty 1

## 2021-12-30 MED ORDER — DIVALPROEX SODIUM 125 MG PO DR TAB
125.0000 mg | DELAYED_RELEASE_TABLET | Freq: Two times a day (BID) | ORAL | Status: DC
  Administered 2021-12-30: 125 mg via ORAL
  Filled 2021-12-30: qty 1

## 2021-12-30 MED ORDER — ACETAMINOPHEN 325 MG PO TABS
650.0000 mg | ORAL_TABLET | Freq: Four times a day (QID) | ORAL | 0 refills | Status: DC
Start: 2021-12-30 — End: 2021-12-30

## 2021-12-30 MED ORDER — ACETAMINOPHEN 325 MG PO TABS: 650.0000 mg | ORAL_TABLET | Freq: Four times a day (QID) | ORAL | 0 refills | Status: AC

## 2021-12-30 MED ORDER — DONEPEZIL HCL 5 MG PO TABS: 5.0000 mg | ORAL_TABLET | Freq: Every day | ORAL | Status: DC

## 2021-12-30 NOTE — TOC Transition Note (Signed)
Transition of Care (TOC) - CM/SW Discharge Note ? ? ?Patient Details  ?Name: Ane Method Helling ?MRN: [id number] ?Date of Birth: [birth year] ? ?Transition of Care (TOC) CM/SW Contact:  ?Brandan Robicheaux A Cashius Grandstaff, LCSW ?Phone Number: ?12/30/2021, 2:49 PM ? ? ?Clinical Narrative:   Pt dc back to Group Home. Brother/Group home director notified and will come pick pt up. Brother notified pt has been dc. ? ? ? ?Final next level of care: Group Home ?Barriers to Discharge: No Barriers Identified ? ? ?Patient Goals and CMS Choice ?Patient states their goals for this hospitalization and ongoing recovery are:: for pt to get better ?CMS Medicare.gov Compare Post Acute Care list provided to:: Legal Guardian ?Choice offered to / list presented to : [facility] POA / Guardian ? ?Discharge Placement ?  ?           ?  ?  ?Name of family member notified: brother ?Patient and family notified of of transfer: 12/30/21 ? ?Discharge Plan and Services ?In-house Referral: Clinical Social Work ?Discharge Planning Services: CM Consult ?           ?  ?  ?  ?  ?  ?  ?  ?  ?  ?  ? ?Social Determinants of Health (SDOH) Interventions ?  ? ? ?Readmission Risk Interventions ?   ? View : No data to display.  ?  ?  ?  ? ? ? ? ? ?

## 2021-12-30 NOTE — Discharge Summary (Signed)
?Physician Discharge Summary ?  ?Patient: Melody Stone MRN: [id number] DOB: 01-May-1951  ?Admit date:     12/28/2021  ?Discharge date: 12/30/21  ?Discharge Physician: Pennie Banter  ? ?PCP: Danella Penton, MD  ? ?Recommendations at discharge:  ? ?Follow-up with primary care in 1 week ?Repeat CBC, BMP in 1 week repeat BMP, CBC in 1 week ?Continue working with physical therapy, weightbearing as tolerated on both right lower extremity ? ?Discharge Diagnoses: ?Principal Problem: ?  Pubic ramus fracture, right, closed, initial encounter (HCC) ?Active Problems: ?  Fall ?  Pelvic fracture (HCC) ? ?Resolved Problems: ?  * No resolved hospital problems. * ? ?Hospital Course: ?Taken from prior attending note: ?"Melody Stone is a 71 y.o. female with a PMH significant for dementia, cognitive delay, hepatitis C, HTN, HLD, GERD, dysphagia, aortic insufficiency, chronic anemia, frequent falls. ?  ?They presented from group home to the ED on 12/28/2021 with fall and subsequent inability to walk.  At baseline, she walks with assistance. ?  ?In the ED, it was found that they had stable vital signs.  ?Significant findings included overall negative imaging results including hip, femur, tibia/fibula x-rays, head and neck CT.  However, she did have finding of acute minimally displaced and impacted fracture on right parasymphyseal superior and inferior pubic rami.  Ortho surgery was consulted for evaluation. ?  ?They were treated with analgesia and not weightbearing.  ?Patient was admitted to medicine service for further workup and management of pelvic fracture as outlined in detail below. ?  ?12/29/21 -stable" ? ?I assumed care of patient today, 12/30/2021.  She remains medically stable for discharge.  TOC informed the group home is able to take patient back today. ? ?Assessment and Plan: ?Pubic rami fracture, right, closed-patient appears to be resting comfortably in bed favoring her right side.  She is unable to tell me if she is  having any pain. ?-Ortho surgery following, appreciate recommendations ?            -No surgical intervention at this time ?            -PT/OT ?            -Weightbearing as tolerated ?-Analgesia scheduled as patient is unable to verbalize her needs ?  ?Dementia  cognitive delay-chronic, stable ?-Continue home medications ?  ?HTN  HLD-chronic, ?-Continue home medications ? ? ?  ? ? ?Consultants: Orthopedic surgery ?Procedures performed: None ?Disposition: Group home ?Diet recommendation:  ?Discharge Diet Orders (From admission, onward)  ? ?  Start     Ordered  ? 12/30/21 0000  Diet - low sodium heart healthy       ? 12/30/21 1328  ? ?  ?  ? ?  ? ?Dysphagia type 1 thin Liquid ?DISCHARGE MEDICATION: ?Allergies as of 12/30/2021   ? ?   Reactions  ? Other Other (See Comments)  ? Psychotropic drugs cause kidney failure. ?Psychotropic drug cause renal failure. ?Psychotropic drugs cause kidney failure.  ? Ciprofloxacin Rash  ? Per caregiver.  ? ?  ? ?  ?Medication List  ?  ? ?STOP taking these medications   ? ?hydrochlorothiazide 25 MG tablet ?Commonly known as: HYDRODIURIL ?  ?metroNIDAZOLE 500 MG tablet ?Commonly known as: FLAGYL ?  ? ?  ? ?TAKE these medications   ? ?acetaminophen 325 MG tablet ?Commonly known as: Tylenol ?Take 2 tablets (650 mg total) by mouth every 6 (six) hours. ?What changed:  ?when to take this ?reasons  to take this ?  ?B-Complex/Folic Acid/Vitamin C Tbcr ?Take 1 tablet by mouth daily. ?  ?chlorhexidine 0.12 % solution ?Commonly known as: PERIDEX ?SMARTSIG:By Mouth ?  ?clonazePAM 0.5 MG tablet ?Commonly known as: KLONOPIN ?Take 0.25-0.5 mg by mouth See admin instructions. Take ? tablet (0.25mg ) by mouth every morning and 1 tablet (1mg ) by mouth every evening ?  ?divalproex 125 MG DR tablet ?Commonly known as: DEPAKOTE ?Take 125 mg by mouth 2 (two) times daily. ?  ?docusate sodium 100 MG capsule ?Commonly known as: COLACE ?Take 200 mg by mouth daily. ?  ?donepezil 5 MG tablet ?Commonly known as:  ARICEPT ?Take 5 mg by mouth daily. ?  ?HYDROcodone-acetaminophen 5-325 MG tablet ?Commonly known as: NORCO/VICODIN ?Take 1-2 tablets by mouth every 4 (four) hours as needed for moderate pain. ?  ?ibuprofen 200 MG tablet ?Commonly known as: ADVIL ?Take 600 mg by mouth 2 (two) times daily. ?  ?loratadine 10 MG tablet ?Commonly known as: CLARITIN ?Take by mouth. ?  ?losartan 50 MG tablet ?Commonly known as: COZAAR ?Take 1 tablet by mouth daily. ?  ?memantine 10 MG tablet ?Commonly known as: NAMENDA ?Take 10 mg by mouth 2 (two) times daily. ?  ?Olopatadine HCl 0.2 % Soln ?Place 1 drop into both eyes daily. ?  ?pantoprazole 40 MG tablet ?Commonly known as: PROTONIX ?Take 1 tablet by mouth daily. ?  ?polyethylene glycol 17 g packet ?Commonly known as: MiraLax ?Take 17 g by mouth daily. ?  ?potassium chloride 20 MEQ/15ML (10%) Soln ?SMARTSIG:Milliliter(s) By Mouth ?  ?senna 8.6 MG tablet ?Commonly known as: SENOKOT ?Take 2 tablets by mouth daily. ?  ?Systane Balance 0.6 % Soln ?Generic drug: Propylene Glycol ?Apply 1 drop to eye 4 (four) times daily. ?  ?venlafaxine XR 37.5 MG 24 hr capsule ?Commonly known as: EFFEXOR-XR ?Take 37.5 mg by mouth daily. ?  ?verapamil 180 MG CR tablet ?Commonly known as: CALAN-SR ?Take 180 mg by mouth 2 (two) times daily. ?  ?Vitamin D3 25 MCG (1000 UT) Caps ?Take 2,000 Units by mouth daily. ?  ? ?  ? ?  ?  ? ? ?  ?Discharge Care Instructions  ?(From admission, onward)  ?  ? ? ?  ? ?  Start     Ordered  ? 12/30/21 0000  Discharge wound care:       ?Comments: Keep area clean and dry.  Monitor for redness, swelling or drainage (signs of infection). ?Follow up as instructed with Primary Care  ? 12/30/21 1328  ? ?  ?  ? ?  ? ? Follow-up Information   ? ? Danella PentonMiller, Mark F, MD.   ?Specialty: Internal Medicine ?Why: Follow-up in 10 to 14 days for staple removal ?Contact information: ?[address] ?[facility] ?[place] [id number] ?[phone] ? ? ?  ?  ? ?  ?  ? ?   ? ?Discharge Exam: ?Filed Weights  ? 12/28/21 0916  ?Weight: 65 kg  ? ?General exam: Sleeping comfortably, wakes briefly to voice, no acute distress ?HEENT: moist mucus membranes, hearing grossly normal  ?Respiratory system: CTAB, no wheezes, rales or rhonchi, normal respiratory effort. ?Cardiovascular system: normal S1/S2, RRR.   ?Gastrointestinal system: soft, nontender nondistended abdomen ?Central nervous system: no gross focal neurologic deficits, exam limited by somnolence and patient not following commands ?Extremities: no edema, normal tone ?Skin: dry, intact, normal temperature ?Psychiatry: Unable to assess due to somnolence ? ? ?Condition at discharge: stable ? ?The results of significant  diagnostics from this hospitalization (including imaging, microbiology, ancillary and laboratory) are listed below for reference.  ? ?Imaging Studies: ?DG Chest 1 View ? ?Result Date: 12/28/2021 ?CLINICAL DATA:  Trauma, fall EXAM: CHEST  1 VIEW COMPARISON:  02/04/2021 FINDINGS: The heart size and mediastinal contours are within normal limits. Both lungs are clear. The visualized skeletal structures are unremarkable. IMPRESSION: No active disease. Electronically Signed   By: Ernie Avena M.D.   On: 12/28/2021 09:53  ? ?DG Tibia/Fibula Right ? ?Result Date: 12/28/2021 ?CLINICAL DATA:  Right leg pain after fall. EXAM: RIGHT TIBIA AND FIBULA - 2 VIEW COMPARISON:  None Available. FINDINGS: There is no evidence of fracture or other focal bone lesions. Soft tissues are unremarkable. IMPRESSION: Negative. Electronically Signed   By: Lupita Raider M.D.   On: 12/28/2021 13:40  ? ?CT HEAD WO CONTRAST ( ) ? ?Result Date: 12/28/2021 ?CLINICAL DATA:  Head trauma, moderate-severe; Neck trauma (Age >= 65y) EXAM: CT HEAD WITHOUT CONTRAST CT CERVICAL SPINE WITHOUT CONTRAST TECHNIQUE: Multidetector CT imaging of the head and cervical spine was performed following the standard protocol without intravenous contrast. Multiplanar CT image  reconstructions of the cervical spine were also generated. RADIATION DOSE REDUCTION: This exam was performed according to the departmental dose-optimization program which includes automated exposure control, adjus

## 2021-12-30 NOTE — Progress Notes (Signed)
Pt discharges to group home with caregivers. Rolator walker was ordered and received by caregivers.   AVS packet given to caregivers and they took patient out of hospital by wheelchair.   ?

## 2021-12-30 NOTE — TOC Progression Note (Signed)
Transition of Care (TOC) - Progression Note  ? ? ?Patient Details  ?Name: [name] ?MRN: [id number] ?Date of Birth: 1951-06-27 ? ?Transition of Care (TOC) CM/SW Contact  ?Shelli Portilla A Garcia Dalzell, LCSW ?Phone Number: ?12/30/2021, 4:31 PM ? ?Clinical Narrative:   Group Home here to pick pt up stating pt needs a rollator and a gait belt. CSW ordered through [employer]. Orders put in. Notified RN that group home will have to be patient and wait on the DME. ? ? ? ?Expected Discharge Plan: Home w Home Health Services ?Barriers to Discharge: No Barriers Identified ? ?Expected Discharge Plan and Services ?Expected Discharge Plan: Home w Home Health Services ?In-house Referral: Clinical Social Work ?Discharge Planning Services: CM Consult ?  ?Living arrangements for the past 2 months: Group Home ?Expected Discharge Date: 12/30/21               ?  ?  ?  ?  ?  ?  ?  ?  ?  ?  ? ? ?Social Determinants of Health (SDOH) Interventions ?  ? ?Readmission Risk Interventions ?   ? View : No data to display.  ?  ?  ?  ? ? ?

## 2021-12-31 ENCOUNTER — Emergency Department
Admission: EM | Admit: 2021-12-31 | Discharge: 2022-01-01 | Disposition: A | Discharge status: Skilled Nursing Facility | Payer: Medicare Other | Attending: Emergency Medicine | Admitting: Emergency Medicine

## 2021-12-31 ENCOUNTER — Other Ambulatory Visit: Payer: Self-pay

## 2021-12-31 ENCOUNTER — Emergency Department: Payer: Medicare Other

## 2021-12-31 ENCOUNTER — Encounter: Payer: Self-pay | Admitting: Intensive Care

## 2021-12-31 DIAGNOSIS — I10 Essential (primary) hypertension: Secondary | ICD-10-CM | POA: Insufficient documentation

## 2021-12-31 DIAGNOSIS — I959 Hypotension, unspecified: Secondary | ICD-10-CM

## 2021-12-31 LAB — BASIC METABOLIC PANEL
Anion gap: 8 (ref 5–15)
BUN: 52 mg/dL — ABNORMAL HIGH (ref 8–23)
CO2: 27 mmol/L (ref 22–32)
Calcium: 9.9 mg/dL (ref 8.9–10.3)
Chloride: 100 mmol/L (ref 98–111)
Creatinine, Ser: 1.15 mg/dL — ABNORMAL HIGH (ref 0.44–1.00)
GFR, Estimated: 50 mL/min — ABNORMAL LOW (ref >60)
Glucose, Bld: 141 mg/dL — ABNORMAL HIGH (ref 70–99)
Potassium: 3.9 mmol/L (ref 3.5–5.1)
Sodium: 135 mmol/L (ref 135–145)

## 2021-12-31 LAB — CBC
HCT: 29.5 % — ABNORMAL LOW (ref 36.0–46.0)
Hemoglobin: 9.9 g/dL — ABNORMAL LOW (ref 12.0–15.0)
MCH: 30.7 pg (ref 26.0–34.0)
MCHC: 33.6 g/dL (ref 30.0–36.0)
MCV: 91.6 fL (ref 80.0–100.0)
Platelets: 197 10*3/uL (ref 150–400)
RBC: 3.22 MIL/uL — ABNORMAL LOW (ref 3.87–5.11)
RDW: 13.6 % (ref 11.5–15.5)
WBC: 11.7 10*3/uL — ABNORMAL HIGH (ref 4.0–10.5)
nRBC: 0 % (ref 0.0–0.2)

## 2021-12-31 LAB — TROPONIN I (HIGH SENSITIVITY)
Troponin I (High Sensitivity): 10 ng/L (ref <18)
Troponin I (High Sensitivity): 9 ng/L (ref <18)

## 2021-12-31 NOTE — ED Notes (Signed)
First Nurse Note:  Pt to ED via [facility] from Group home. Pt was reported to have low blood pressure. Group home reported pt BP in the 80's systolic, Fire department got 979/89. EMS arrived and got a blood pressure that was in the 80's systolic as well, pt was given fluid, around 400 mL, last Bp per EMS 126/88 ?

## 2021-12-31 NOTE — ED Triage Notes (Signed)
Patient arrived by EMS from [facility] for hypotension. Staff reported low b/p and EMS reported 80s systolic. EMS administered fluid and last pressure was 126/88. Patient has mental retardation and unable to answer questions ?

## 2021-12-31 NOTE — ED Notes (Signed)
Sitter from group home arrived in the lobby. She was informed that pt was in x-ray and will be back shortly ?

## 2021-12-31 NOTE — ED Provider Notes (Signed)
? ?[facility] ?Provider Note ? ? Event Date/Time  ? First MD Initiated Contact with Patient 12/31/21 2056   ?  (approximate) ?History  ?Hypotension ? ?HPI ?Melody Stone is a 71 y.o. female with a past medical history of mental retardation, hyperlipidemia, hypertension, and dysphagia who presents from her long-term care facility for hypotension.  Staff reported patient was not acting herself family took her blood pressure they found her systolics to be in the low 80s.  EMS found similar systolic blood pressures however after giving a half a liter of normal saline, patient's pressure improved to 126/88.  Patient's caregiver is at bedside and provides this history as patient cannot private any further history and review of systems. ?Physical Exam  ?Triage Vital Signs: ?ED Triage Vitals  ?Enc Vitals Group  ?   BP 12/31/21 1620 (!) 130/46  ?   Pulse Rate 12/31/21 1620 (!) 102  ?   Resp 12/31/21 1620 18  ?   Temp 12/31/21 1620 (!) 96.4 ?F (35.8 ?C)  ?   Temp Source 12/31/21 1620 Axillary  ?   SpO2 12/31/21 1620 93 %  ?   Weight 12/31/21 1624 143 lb 4.8 oz (65 kg)  ?   Height 12/31/21 1624 5\' 6"  (1.676 m)  ?   Head Circumference --   ?   Peak Flow --   ?   Pain Score --   ?   Pain Loc --   ?   Pain Edu? --   ?   Excl. in GC? --   ? ?Most recent vital signs: ?Vitals:  ? 12/31/21 1912 12/31/21 2136  ?BP: (!) 151/61 (!) 158/82  ?Pulse: 61 71  ?Resp: 18 17  ?Temp: (!) 97.5 ?F (36.4 ?C)   ?SpO2: 97% 100%  ? ?General: Awake, cooperative ?CV:  Good peripheral perfusion.  ?Resp:  Normal effort.  ?Abd:  No distention.  ?Other:  Elderly Caucasian female laying in bed in no distress ?ED Results / Procedures / Treatments  ?Labs ?(all labs ordered are listed, but only abnormal results are displayed) ?Labs Reviewed  ?BASIC METABOLIC PANEL - Abnormal; Notable for the following components:  ?    Result Value  ? Glucose, Bld 141 (*)   ? BUN 52 (*)   ? Creatinine, Ser 1.15 (*)   ? GFR, Estimated 50 (*)   ? All  other components within normal limits  ?CBC - Abnormal; Notable for the following components:  ? WBC 11.7 (*)   ? RBC 3.22 (*)   ? Hemoglobin 9.9 (*)   ? HCT 29.5 (*)   ? All other components within normal limits  ?URINALYSIS, ROUTINE W REFLEX MICROSCOPIC  ?TROPONIN I (HIGH SENSITIVITY)  ?TROPONIN I (HIGH SENSITIVITY)  ? ?EKG ?ED ECG REPORT ?I, 2137, the attending physician, personally viewed and interpreted this ECG. ?Date: 12/31/2021 ?EKG Time: 1633 ?Rate: 63 ?Rhythm: normal sinus rhythm ?QRS Axis: normal ?Intervals: normal ?ST/T Wave abnormalities: normal ?Narrative Interpretation: no evidence of acute ischemia ?RADIOLOGY ?ED MD interpretation: 2 view chest x-ray interpreted by me shows no evidence of acute abnormalities including no pneumonia, pneumothorax, or widened mediastinum ?-Agree with radiology assessment ?Official radiology report(s): ?DG Chest 2 View ? ?Result Date: 12/31/2021 ?CLINICAL DATA:  Hypotension, responded to fluid bolus EXAM: CHEST - 2 VIEW COMPARISON:  12/28/2021 FINDINGS: Normal heart size, mediastinal contours, and pulmonary vascularity. Lungs clear. No pulmonary infiltrate, pleural effusion, or pneumothorax. Bones demineralized. IMPRESSION: No acute abnormalities. Aortic Atherosclerosis (ICD10-I70.0).  Electronically Signed   By: Ulyses Southward M.D.   On: 12/31/2021 16:50   ?PROCEDURES: ?Critical Care performed: No ?Procedures ?MEDICATIONS ORDERED IN ED: ?Medications - No data to display ?IMPRESSION / MDM / ASSESSMENT AND PLAN / ED COURSE  ?I reviewed the triage vital signs and the nursing notes. ?             ?               ?The patient is on the cardiac monitor to evaluate for evidence of arrhythmia and/or significant heart rate changes. ?Patient is 71 year old female that presents from her long-term care facility for an episode of hypotension.  Patient's hypotension improved after half a liter of crystalloid prior to arrival and has maintained normotension throughout her  emergency department course.  At this point I have low suspicion for ACS, sepsis, arrhythmia, significant anemia, or medication side effect. ?Patient encouraged to rehydrate at her care facility and case was discussed with the patient's caregiver at bedside who agrees with plan for discharge after urine was collected. ? ?Dispo: Discharge home with PCP follow-up ?  ?FINAL CLINICAL IMPRESSION(S) / ED DIAGNOSES  ? ?Final diagnoses:  ?Transient hypotension  ? ?Rx / DC Orders  ? ?ED Discharge Orders   ? ? None  ? ?  ? ?Note:  This document was prepared using Dragon voice recognition software and may include unintentional dictation errors. ?  ?Merwyn Katos, MD ?12/31/21 2306 ? ?

## 2021-12-31 NOTE — ED Notes (Signed)
Pt placed on purwick device

## 2021-12-31 NOTE — ED Notes (Signed)
Per caregiver pt's BP was 80's systolic after given pt medication norco and ativan. Current BP is 158/82. ?

## 2022-01-01 LAB — URINALYSIS, ROUTINE W REFLEX MICROSCOPIC
Bilirubin Urine: NEGATIVE
Glucose, UA: NEGATIVE mg/dL
Hgb urine dipstick: NEGATIVE
Ketones, ur: NEGATIVE mg/dL
Leukocytes,Ua: NEGATIVE
Nitrite: NEGATIVE
Protein, ur: NEGATIVE mg/dL
Specific Gravity, Urine: 1.02 (ref 1.005–1.030)
pH: 5 (ref 5.0–8.0)

## 2022-01-01 NOTE — ED Provider Notes (Signed)
I was asked by Dr. Vicente Males to follow-up the results of patient's urinalysis to rule out a UTI. UA negative for UTI. BP remains stable.  Commended increase oral hydration at home and close follow-up with primary care doctor.  Discussed my standard return precautions with the patient's caregiver who is at bedside.  Will discharge with plan left in place by Dr. Vicente Males ?  ?Nita Sickle, MD ?01/01/22 0998 ? ?

## 2022-01-01 NOTE — ED Notes (Signed)
This RN attempted to call legal guardian [name] Niswander x2. Unsuccessful to get in contact, RN left a voice message.  ?

## 2022-01-01 NOTE — ED Notes (Signed)
Pt caregiver verbalized understanding of discharge instructions and follow-up care instructions. Caregiver Mary at bedside to take pt back to group home.  ?

## 2022-01-18 ENCOUNTER — Other Ambulatory Visit: Payer: Self-pay | Admitting: Internal Medicine

## 2022-01-18 DIAGNOSIS — R7401 Elevation of levels of liver transaminase levels: Secondary | ICD-10-CM

## 2022-02-09 ENCOUNTER — Ambulatory Visit: Payer: Medicare Other

## 2022-02-16 ENCOUNTER — Ambulatory Visit
Admission: RE | Admit: 2022-02-16 | Discharge: 2022-02-16 | Disposition: A | Discharge status: Home / Self Care | Payer: Medicare Other | Source: Ambulatory Visit | Attending: Internal Medicine | Admitting: Internal Medicine

## 2022-02-16 DIAGNOSIS — R7401 Elevation of levels of liver transaminase levels: Secondary | ICD-10-CM | POA: Diagnosis present

## 2022-07-14 ENCOUNTER — Other Ambulatory Visit: Payer: Self-pay

## 2022-07-14 ENCOUNTER — Emergency Department
Admission: EM | Admit: 2022-07-14 | Discharge: 2022-07-14 | Disposition: A | Discharge status: Home / Self Care | Payer: Medicare Other | Attending: Emergency Medicine | Admitting: Emergency Medicine

## 2022-07-14 ENCOUNTER — Emergency Department: Payer: Medicare Other

## 2022-07-14 DIAGNOSIS — M25512 Pain in left shoulder: Secondary | ICD-10-CM | POA: Insufficient documentation

## 2022-07-14 DIAGNOSIS — I10 Essential (primary) hypertension: Secondary | ICD-10-CM | POA: Insufficient documentation

## 2022-07-14 DIAGNOSIS — S42032A Displaced fracture of lateral end of left clavicle, initial encounter for closed fracture: Secondary | ICD-10-CM | POA: Insufficient documentation

## 2022-07-14 DIAGNOSIS — F0390 Unspecified dementia without behavioral disturbance: Secondary | ICD-10-CM | POA: Diagnosis not present

## 2022-07-14 DIAGNOSIS — W19XXXA Unspecified fall, initial encounter: Secondary | ICD-10-CM | POA: Diagnosis not present

## 2022-07-14 DIAGNOSIS — Y92009 Unspecified place in unspecified non-institutional (private) residence as the place of occurrence of the external cause: Secondary | ICD-10-CM | POA: Diagnosis not present

## 2022-07-14 DIAGNOSIS — S4992XA Unspecified injury of left shoulder and upper arm, initial encounter: Secondary | ICD-10-CM | POA: Diagnosis present

## 2022-07-14 LAB — CBC WITH DIFFERENTIAL/PLATELET
Abs Immature Granulocytes: 0.03 10*3/uL (ref 0.00–0.07)
Basophils Absolute: 0 10*3/uL (ref 0.0–0.1)
Basophils Relative: 0 %
Eosinophils Absolute: 0 10*3/uL (ref 0.0–0.5)
Eosinophils Relative: 0 %
HCT: 30.7 % — ABNORMAL LOW (ref 36.0–46.0)
Hemoglobin: 10.7 g/dL — ABNORMAL LOW (ref 12.0–15.0)
Immature Granulocytes: 0 %
Lymphocytes Relative: 19 %
Lymphs Abs: 1.9 10*3/uL (ref 0.7–4.0)
MCH: 29.8 pg (ref 26.0–34.0)
MCHC: 34.9 g/dL (ref 30.0–36.0)
MCV: 85.5 fL (ref 80.0–100.0)
Monocytes Absolute: 0.9 10*3/uL (ref 0.1–1.0)
Monocytes Relative: 9 %
Neutro Abs: 6.9 10*3/uL (ref 1.7–7.7)
Neutrophils Relative %: 72 %
Platelets: 220 10*3/uL (ref 150–400)
RBC: 3.59 MIL/uL — ABNORMAL LOW (ref 3.87–5.11)
RDW: 13.8 % (ref 11.5–15.5)
WBC: 9.7 10*3/uL (ref 4.0–10.5)
nRBC: 0 % (ref 0.0–0.2)

## 2022-07-14 LAB — BASIC METABOLIC PANEL
Anion gap: 12 (ref 5–15)
BUN: 14 mg/dL (ref 8–23)
CO2: 21 mmol/L — ABNORMAL LOW (ref 22–32)
Calcium: 9.4 mg/dL (ref 8.9–10.3)
Chloride: 103 mmol/L (ref 98–111)
Creatinine, Ser: 0.55 mg/dL (ref 0.44–1.00)
GFR, Estimated: >60 mL/min (ref >60)
Glucose, Bld: 140 mg/dL — ABNORMAL HIGH (ref 70–99)
Potassium: 4.1 mmol/L (ref 3.5–5.1)
Sodium: 136 mmol/L (ref 135–145)

## 2022-07-14 NOTE — ED Provider Notes (Signed)
[facility] Provider Note    Event Date/Time   First MD Initiated Contact with Patient 07/14/22 1330     (approximate)   History   Chief Complaint: Fall   HPI  Melody Stone is a 71 y.o. female with a history of hypertension, dementia, intellectual disability who is brought to the ED from [facility] due to decreased use of the left shoulder suspicious for pain or injury.  No known fall or trauma.  Otherwise at baseline.  Compliant with medications and usual care.     Physical Exam   Triage Vital Signs: ED Triage Vitals  Enc Vitals Group     BP      Pulse      Resp      Temp      Temp src      SpO2      Weight      Height      Head Circumference      Peak Flow      Pain Score      Pain Loc      Pain Edu?      Excl. in GC?     Most recent vital signs: Vitals:   07/14/22 1332  BP: (!) 160/60  Pulse: 69  Resp: 18  Temp: 97.8 F (36.6 C)  SpO2: 100%    General: Awake, no distress.  CV:  Good peripheral perfusion.  Regular rate and rhythm Resp:  Normal effort.  Clear to auscultation bilaterally Abd:  No distention.  Soft nontender Other:  Mild tenderness at the lateral clavicle/acromion.  No crepitus, no wound, no deformity.  Intact range of motion.  Humerus stable and nontender   ED Results / Procedures / Treatments   Labs (all labs ordered are listed, but only abnormal results are displayed) Labs Reviewed  BASIC METABOLIC PANEL - Abnormal; Notable for the following components:      Result Value   CO2 21 (*)    Glucose, Bld 140 (*)    All other components within normal limits  CBC WITH DIFFERENTIAL/PLATELET - Abnormal; Notable for the following components:   RBC 3.59 (*)    Hemoglobin 10.7 (*)    HCT 30.7 (*)    All other components within normal limits     EKG Interpreted by me Sinus rhythm rate of 68.  Normal axis QRS ST segments and T waves.  Mildly prolonged QTc of 508 ms.   RADIOLOGY X-ray  left shoulder interpreted by me, shows lateral clavicle fracture, displaced.  Humerus is located and not fractured.  Radiology report reviewed   PROCEDURES:  Procedures   MEDICATIONS ORDERED IN ED: Medications - No data to display   IMPRESSION / MDM / ASSESSMENT AND PLAN / ED COURSE  I reviewed the triage vital signs and the nursing notes.                              Differential diagnosis includes, but is not limited to, shoulder dislocation, humerus fracture, clavicle fracture, AKI, electrolyte abnormality, dehydration, anemia  Patient's presentation is most consistent with acute complicated illness / injury requiring diagnostic workup.  Patient brought to the ED due to exhibiting signs of left shoulder pain.  She is unable to provide any direct history.  No known trauma or fall.  Labs unremarkable.  X-ray of the left shoulder does reveal a lateral clavicle fracture.  No skin tenting, no open wound.  We will place a sling and refer to orthopedic follow-up.       FINAL CLINICAL IMPRESSION(S) / ED DIAGNOSES   Final diagnoses:  Acute pain of left shoulder  Chronic dementia (HCC)  Displaced fracture of lateral end of left clavicle, initial encounter for closed fracture     Rx / DC Orders   ED Discharge Orders     None        Note:  This document was prepared using Dragon voice recognition software and may include unintentional dictation errors.   Sharman Cheek, MD 07/14/22 1459

## 2022-07-14 NOTE — ED Notes (Signed)
Slight applied by Morrie Sheldon, RN and this RN. Pt signed ortho order form with assistance of care giver. Group home VP called and updated.

## 2022-07-14 NOTE — ED Triage Notes (Signed)
Pt from [facility] group home, pt with decreased left shoulder movement, per pt, pt states she fell in her room last night. Staff did not witness fall, but noticed decreased movement in left arm today. Pt with c/o left arm/ shoulder pain. Pt alert to self and situation. Per staff, communication and orientation is pt's normal.

## 2022-07-14 NOTE — Discharge Instructions (Signed)
Your x-ray shows a fracture of the left clavicle near the shoulder.  Wear a sling at all times when out of bed to minimize pain.  You can take anti-inflammatory medicine such as acetaminophen and ibuprofen to help control the pain as well.  Follow-up with orthopedics for further evaluation.  Your lab tests today all looked okay.

## 2022-11-01 ENCOUNTER — Emergency Department: Payer: Medicare Other

## 2022-11-01 ENCOUNTER — Encounter: Payer: Self-pay | Admitting: Emergency Medicine

## 2022-11-01 ENCOUNTER — Emergency Department
Admission: EM | Admit: 2022-11-01 | Discharge: 2022-11-01 | Disposition: A | Discharge status: Home / Self Care | Payer: Medicare Other | Attending: Emergency Medicine | Admitting: Emergency Medicine

## 2022-11-01 DIAGNOSIS — R0789 Other chest pain: Secondary | ICD-10-CM

## 2022-11-01 DIAGNOSIS — I6789 Other cerebrovascular disease: Secondary | ICD-10-CM | POA: Insufficient documentation

## 2022-11-01 DIAGNOSIS — R079 Chest pain, unspecified: Secondary | ICD-10-CM | POA: Diagnosis present

## 2022-11-01 DIAGNOSIS — K209 Esophagitis, unspecified without bleeding: Secondary | ICD-10-CM | POA: Diagnosis not present

## 2022-11-01 DIAGNOSIS — I10 Essential (primary) hypertension: Secondary | ICD-10-CM | POA: Diagnosis not present

## 2022-11-01 LAB — BASIC METABOLIC PANEL
Anion gap: 9 (ref 5–15)
BUN: 18 mg/dL (ref 8–23)
CO2: 24 mmol/L (ref 22–32)
Calcium: 10 mg/dL (ref 8.9–10.3)
Chloride: 104 mmol/L (ref 98–111)
Creatinine, Ser: 0.55 mg/dL (ref 0.44–1.00)
GFR, Estimated: >60 mL/min (ref >60)
Glucose, Bld: 88 mg/dL (ref 70–99)
Potassium: 3.9 mmol/L (ref 3.5–5.1)
Sodium: 137 mmol/L (ref 135–145)

## 2022-11-01 LAB — URINALYSIS, ROUTINE W REFLEX MICROSCOPIC
Bilirubin Urine: NEGATIVE
Glucose, UA: NEGATIVE mg/dL
Hgb urine dipstick: NEGATIVE
Ketones, ur: NEGATIVE mg/dL
Leukocytes,Ua: NEGATIVE
Nitrite: NEGATIVE
Protein, ur: NEGATIVE mg/dL
Specific Gravity, Urine: 1.033 — ABNORMAL HIGH (ref 1.005–1.030)
pH: 8 (ref 5.0–8.0)

## 2022-11-01 LAB — CBC
HCT: 33.2 % — ABNORMAL LOW (ref 36.0–46.0)
Hemoglobin: 11.6 g/dL — ABNORMAL LOW (ref 12.0–15.0)
MCH: 30.2 pg (ref 26.0–34.0)
MCHC: 34.9 g/dL (ref 30.0–36.0)
MCV: 86.5 fL (ref 80.0–100.0)
Platelets: 241 10*3/uL (ref 150–400)
RBC: 3.84 MIL/uL — ABNORMAL LOW (ref 3.87–5.11)
RDW: 13.5 % (ref 11.5–15.5)
WBC: 8.2 10*3/uL (ref 4.0–10.5)
nRBC: 0 % (ref 0.0–0.2)

## 2022-11-01 LAB — HEPATIC FUNCTION PANEL
ALT: 14 U/L (ref 0–44)
AST: 23 U/L (ref 15–41)
Albumin: 3.9 g/dL (ref 3.5–5.0)
Alkaline Phosphatase: 104 U/L (ref 38–126)
Bilirubin, Direct: <0.1 mg/dL (ref 0.0–0.2)
Total Bilirubin: 0.9 mg/dL (ref 0.3–1.2)
Total Protein: 7.1 g/dL (ref 6.5–8.1)

## 2022-11-01 LAB — LIPASE, BLOOD: Lipase: 46 U/L (ref 11–51)

## 2022-11-01 LAB — TROPONIN I (HIGH SENSITIVITY)
Troponin I (High Sensitivity): 4 ng/L (ref <18)
Troponin I (High Sensitivity): 5 ng/L (ref <18)

## 2022-11-01 MED ORDER — PANTOPRAZOLE SODIUM 40 MG IV SOLR
40.0000 mg | Freq: Once | INTRAVENOUS | Status: AC
  Administered 2022-11-01: 40 mg via INTRAVENOUS
  Filled 2022-11-01: qty 10

## 2022-11-01 MED ORDER — PANTOPRAZOLE SODIUM 40 MG PO TBEC: 40.0000 mg | DELAYED_RELEASE_TABLET | Freq: Every day | ORAL | 0 refills | Status: DC

## 2022-11-01 MED ORDER — ACETAMINOPHEN 500 MG PO TABS
1000.0000 mg | ORAL_TABLET | Freq: Once | ORAL | Status: AC
  Administered 2022-11-01: 1000 mg via ORAL
  Filled 2022-11-01: qty 2

## 2022-11-01 MED ORDER — LORAZEPAM 2 MG/ML IJ SOLN
0.5000 mg | Freq: Once | INTRAMUSCULAR | Status: AC
  Administered 2022-11-01: 0.5 mg via INTRAVENOUS
  Filled 2022-11-01: qty 1

## 2022-11-01 MED ORDER — IOHEXOL 350 MG/ML SOLN
100.0000 mL | Freq: Once | INTRAVENOUS | Status: AC | PRN
  Administered 2022-11-01: 100 mL via INTRAVENOUS

## 2022-11-01 NOTE — Discharge Instructions (Addendum)
Her workup was reassuring without any evidence of heart attack.  Does have some inflammation of her esophagus so we will start her on Protonix you can discuss this further with her primary care doctor to continue with outpatient longer course if it seems to be helping.  She was to return to the ER if develops worsening symptoms or any other concerns.  Try to avoid ibuprofen as this can flareup any type of gastritis or inflammation.  You can take Tylenol instead for pain as this will be safer.  We discussed the incidental finding of the stenosis that is causing her high blood pressure but given her kidney function is normal you may opt to not want to do anything about this at this time but you can discuss further with your primary doctor  IMPRESSION: 1. No evidence of acute aortic syndrome. No acute intrathoracic or intra-abdominal process. 2. Chronic mild circumferential esophageal wall thickening, suggestive of chronic esophagitis. 3. 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

## 2022-11-01 NOTE — ED Provider Notes (Signed)
[facility] Provider Note    Event Date/Time   First MD Initiated Contact with Patient 11/01/22 2006     (approximate)   History   Chest Pain   HPI  Melody Stone is a 72 y.o. female with a history of intellectual disability with legal guardian who comes in from home due to concern for chest pain.  Patient was picked up from her day program and patient was crying complaining of chest pain so sent to the emergency room to be evaluated.  Patient is very difficult to get history on due to her intellectual disability.  Her POA, legal guardian is on the phone who does report that she is coming before for chest pain and had negative workups but does not typically complain of chest pain.  He wonders if it could be some acid reflux.  When I tried to ask her where the pain is at she is discharged crying.  Hard to tell if it is in her abdomen or just in her chest.  They deny any known falls but history somewhat limited as above.   Physical Exam   Triage Vital Signs: ED Triage Vitals  Enc Vitals Group     BP 11/01/22 1739 (!) 162/103     Pulse Rate 11/01/22 1739 66     Resp 11/01/22 1739 (!) 22     Temp 11/01/22 1739 98.2 F (36.8 C)     Temp Source 11/01/22 1739 Oral     SpO2 11/01/22 1739 100 %     Weight 11/01/22 1737 114 lb 10.2 oz (52 kg)     Height 11/01/22 1737 '5\' 6"'$  (1.676 m)     Head Circumference --      Peak Flow --      Pain Score --      Pain Loc --      Pain Edu? --      Excl. in [place]? --     Most recent vital signs: Vitals:   11/01/22 1739  BP: (!) 162/103  Pulse: 66  Resp: (!) 22  Temp: 98.2 F (36.8 C)  SpO2: 100%     General: Awake, no distress.  CV:  Good peripheral perfusion.  Resp:  Normal effort.  Abd:  No distention.  Patient seems to be crying when I push on her belly but also crying when I ask her about her chest.  She has got no obvious rashes or bruising noted. Other:     ED Results / Procedures / Treatments    Labs (all labs ordered are listed, but only abnormal results are displayed) Labs Reviewed  CBC - Abnormal; Notable for the following components:      Result Value   RBC 3.84 (*)    Hemoglobin 11.6 (*)    HCT 33.2 (*)    All other components within normal limits  BASIC METABOLIC PANEL  TROPONIN I (HIGH SENSITIVITY)  TROPONIN I (HIGH SENSITIVITY)     EKG  My interpretation of EKG:  Normal sinus rate of 66 without any ST elevation or T wave inversions, normal intervals  RADIOLOGY I have reviewed the xray personally and interpreted no focal pneumonia.  She has got an old distal left clavicular fracture  I reviewed patient's orthopedic notes where she does have prior clavicle fracture.  PROCEDURES:  Critical Care performed: No  .1-3 Lead EKG Interpretation  Performed by: Vanessa [place], MD Authorized by: Vanessa [place], MD     Interpretation:  normal     ECG rate:  60   ECG rate assessment: normal     Rhythm: sinus rhythm     Ectopy: none      MEDICATIONS ORDERED IN ED: Medications  LORazepam (ATIVAN) injection 0.5 mg (0.5 mg Intravenous Given 11/01/22 2049)  iohexol (OMNIPAQUE) 350 MG/ML injection 100 mL (100 mLs Intravenous Contrast Given 11/01/22 2055)  acetaminophen (TYLENOL) tablet 1,000 mg (1,000 mg Oral Given 11/01/22 2122)  pantoprazole (PROTONIX) injection 40 mg (40 mg Intravenous Given 11/01/22 2122)     IMPRESSION / MDM / ASSESSMENT AND PLAN / ED COURSE  I reviewed the triage vital signs and the nursing notes.   Patient's presentation is most consistent with acute presentation with potential threat to life or bodily function.  Patient's history and examination is very limited due to her intellectual disability she seems just to be crying unclear where the pain is coming from.  Potentially her chest may be her abdomen.  Workup was initiated evaluate for ACS, pancreatitis, liver dysfunction.  Had extensive conversation with family and patient's guardian and  caregiver about whether or not would want to do CT scan just to rule out anything else given she is significantly hypertensive and complaining of chest pain.  They would like to proceed with some sedation and CT imaging.  BMP normal CBC shows stable hemoglobin and troponin is negative  UA without evidence of UTI troponins are negative x 2 hepatic function lipase are normal.  IMPRESSION: 1. No evidence of acute aortic syndrome. No acute intrathoracic or intra-abdominal process. 2. Chronic mild circumferential esophageal wall thickening, suggestive of chronic esophagitis. 3. 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  Discussed incidental finding of the stenosis with patient's brother legal guardian with this can cause the elevated blood pressure we will give her vascular's number for follow-up given her kidney function is normal they may opt to not intervene at all given her comorbidities.  For the chronic esophagitis discussed that this could be acid reflux with esophagitis causing her pain.  Has previously been on 40 mg of Protonix.  Will start a course of that and she can follow-up with her primary care doctor to consider UA or decrease the dose if she is her pain seems to be getting better.  Repeat evaluation she does not seem to be in any pain and feel comfortable with this plan.  The patient is on the cardiac monitor to evaluate for evidence of arrhythmia and/or significant heart rate changes.      FINAL CLINICAL IMPRESSION(S) / ED DIAGNOSES   Final diagnoses:  Atypical chest pain  Esophagitis     Rx / DC Orders   ED Discharge Orders          Ordered    pantoprazole (PROTONIX) 40 MG tablet  Daily        11/01/22 2211             Note:  This document was prepared using Dragon voice recognition software and may include unintentional dictation errors.   Vanessa Farnam, MD 11/01/22 2216

## 2022-11-01 NOTE — ED Notes (Signed)
Patient discharged at this time with caregiver who is transporting patient back to her group home. All discharge information dicussed with caregiver who denies further questions.

## 2022-11-01 NOTE — ED Triage Notes (Signed)
Pt sent from [facility] where she was at her day program where she continues to cry and point to her left side of chest. Caregiver reports she has dementia.

## 2022-11-01 NOTE — ED Notes (Signed)
Patient transported to CT 

## 2022-11-01 NOTE — ED Notes (Signed)
Patient in wheelchair to ER exit. All discharge information discussed with patient/visitor. All patient/visitor questions answered. Patient denies further needs at time of discharge. Patient Awake and alert, respirations even and nonlabored, patient without signs of distress at time of discharge.

## 2022-11-01 NOTE — ED Notes (Signed)
Patient ambulated to restroom with x1 assist. Able to give urine sample.

## 2022-11-01 NOTE — ED Notes (Signed)
Patient to [id number] accompanied by caregiver. Patient able to answer yes or no questions. Appears NAD. Caregiver updated on plan of care. Denies questions or needs at this time.

## 2022-11-29 ENCOUNTER — Ambulatory Visit (INDEPENDENT_AMBULATORY_CARE_PROVIDER_SITE_OTHER): Payer: Medicare Other | Admitting: Vascular Surgery

## 2022-11-29 ENCOUNTER — Encounter (INDEPENDENT_AMBULATORY_CARE_PROVIDER_SITE_OTHER): Payer: Self-pay | Admitting: Vascular Surgery

## 2022-11-29 VITALS — BP 147/83 | HR 74 | Resp 18 | Ht 64.0 in | Wt 113.2 lb

## 2022-11-29 DIAGNOSIS — I701 Atherosclerosis of renal artery: Secondary | ICD-10-CM | POA: Insufficient documentation

## 2022-11-29 DIAGNOSIS — I150 Renovascular hypertension: Secondary | ICD-10-CM

## 2022-11-29 NOTE — Assessment & Plan Note (Signed)
Blood pressure control is suboptimal. I have independently reviewed her CT angiogram from last month.  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  Given her clinical scenario of poorly controlled hypertension, consideration for renal artery intervention should be given.  I discussed the situation with the facility nurse who is in agreement.  The patient's brother will need to provide consent and if he is agreeable in proceeding with renal artery angiogram and possible stent placement will be planned in the near future to try to improve her blood pressure and maintain her renal function.

## 2022-11-29 NOTE — Patient Instructions (Signed)
Renal Artery Stenosis Renal artery stenosis (RAS) is when the artery that carries blood to your kidney narrows. It can affect one or both kidneys. Normally, the kidneys filter waste and extra fluid from your blood. The waste and fluid leave your body when you pee (urinate). The kidneys also make a hormone called renin. Renin helps keep your blood pressure at a good level. The first sign of RAS may be high blood pressure. What are the causes? A common cause of RAS is atherosclerosis. This is when your arteries harden because of plaque. The plaques that cause this are made up of: Fat. Cholesterol. Calcium. Other substances. As these things build up in your artery, the blood supply to your kidneys slows. This can cause the signs and symptoms of RAS. A much less common cause of RAS is a disease called fibromuscular dysplasia. It causes abnormal cell growth. It occurs mainly in females who are younger than 74 years old. It may be passed down through families. What increases the risk? You are more likely to develop this condition if: You are a female who is older than 74 years of age or a female who is older than 74 years of age. You have certain health conditions. These include high blood pressure, high cholesterol, diabetes, and prediabetes. You smoke or are not able to control your drinking. You are overweight or obese. You have a family history of early heart disease. What are the signs or symptoms? In most cases, RAS develops over time. You may not have any symptoms at first. Early signs may include: High blood pressure. A sudden increase in your blood pressure. Not responding to the medicine you use to control your blood pressure. Later signs and symptoms may be caused by kidney damage. They may include: Feeling tired (fatigue). Feeling short of breath. Swollen legs and feet. Dry skin. Headaches. Muscle cramps. Loss of appetite, nausea, or vomiting. How is this diagnosed? This condition  may be diagnosed based on your symptoms and medical history. You may also have: A physical exam. Your provider may listen for a whooshing sound in your body. This may be heard if the renal artery is blocking blood flow. Tests. These may include: Blood and pee (urine) tests to check how your kidneys are working. Imaging tests of your kidneys. These may include: An ultrasound. A renal angiogram. For this test, a dye is injected to show where the artery is narrow. How is this treated? Treatment for this condition often involves making changes to your lifestyle. The main goal is to control high blood pressure. You may also be given medicines to: Lower your blood pressure. You may need more than one type of medicine for this. These medicines include: ACE inhibitors. Angiotensin receptor blockers. Reduce fluid in your body. These medicines are called diuretics. Lower your cholesterol. These medicines are called statins. In some cases, you may need surgery. You may need: Angioplasty. This is when a tube with a balloon on it is put into the renal artery to force it to open. Endarterectomy. This is a procedure to remove plaque from inside the artery. Follow these instructions at home: Lifestyle Make changes to your lifestyle as told by your provider. You may need to: Work with an Cytogeneticist in diet and nutrition (dietitian). They can help you maintain a heart-healthy diet. This type of diet is low in saturated fat, salt, and added sugar. Start an exercise program. Stay at a healthy weight. Quit smoking. Drink less alcohol. General instructions Take  over-the-counter and prescription medicines only as told by your provider. Keep all follow-up visits. Your provider will check if treatment is working and help you manage symptoms. Contact a health care provider if: Your symptoms are not getting better. Your symptoms are changing or getting worse. Get help right away if: You have severe pain in your  back or abdomen. You have blood in your pee. You feel short of breath all of a sudden. These symptoms may be an emergency. Get help right away. Call 911. Do not wait to see if the symptoms will go away. Do not drive yourself to the hospital. This information is not intended to replace advice given to you by your health care provider. Make sure you discuss any questions you have with your health care provider. Document Revised: 04/29/2022 Document Reviewed: 04/29/2022 Elsevier Patient Education  [place].

## 2022-11-29 NOTE — H&P (View-Only) (Signed)
  Patient ID: Melody Stone, female   DOB: 02/03/1951, 71 y.o.   MRN: [id number]  Chief Complaint  Patient presents with   New Patient (Initial Visit)    NP consult; No referral - seen in ED 3.4.24    HPI Melody Stone is a 71 y.o. female.  I am asked to see the patient by Dr. Funke in the [facility] ER for evaluation of renal artery stenosis in the setting of severe hypertension.  She presented to the emergency room with hypertensive urgency.  Her blood pressure has gotten under some better control but remains elevated.  In talking with the nurse from her facility, this has been an ongoing and recurrent for her.  She has these hypertensive urgency, she gets chest pain and other issues as well.  She cannot provide any of the history with her mental status.  I have independently reviewed her CT angiogram from last month.  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   Past Medical History:  Diagnosis Date   Anemia    Aortic insufficiency    a. 10/2015 Echo: Nl LV fxn, mild to moderate AI; b. 02/2017 Echo: EF 60-65%, no rwma, mild AI.   Chest pain    a. 10/2015 Ex MV ([facility]): EF 69%, no ischemia; b. 01/2017 Lexiscan MV: EF 45%, no ischemia/infarct-->Low risk.   Dementia    Dyspnea on exertion    Hepatitis C    HLD (hyperlipidemia)    Hypertension    Mental retardation    Reflux esophagitis     Past Surgical History:  Procedure Laterality Date   ABDOMINAL HYSTERECTOMY     APPENDECTOMY       Family History  Problem Relation Age of Onset   Heart attack Mother        CABG in her 60's   Cancer Mother    CAD Brother        67   Hypertension Brother        61      Social History   Tobacco Use   Smoking status: Never   Smokeless tobacco: Never  Vaping Use   Vaping Use: Never used  Substance Use Topics   Alcohol use: No   Drug use: No  Lives in a group home  Allergies   Allergen Reactions   Other Other (See Comments)    Psychotropic drugs cause kidney failure. Psychotropic drug cause renal failure. Psychotropic drugs cause kidney failure.   Ciprofloxacin Rash    Per caregiver.    Current Outpatient Medications  Medication Sig Dispense Refill   acetaminophen (TYLENOL) 325 MG tablet Take 2 tablets (650 mg total) by mouth every 6 (six) hours. 30 tablet 0   Aloe Vera OIL by Does not apply route. Cream     Brimonidine Tartrate (LUMIFY) 0.025 % SOLN Apply to eye.     chlorhexidine (PERIDEX) 0.12 % solution SMARTSIG:By Mouth     Cholecalciferol (VITAMIN D3) 1000 units CAPS Take 2,000 Units by mouth daily.      clonazePAM (KLONOPIN) 0.5 MG tablet Take 0.25-0.5 mg by mouth See admin instructions. Take  tablet (0.25mg) by mouth every morning and 1 tablet (1mg) by mouth every evening     docusate sodium (COLACE) 100 MG capsule Take 200 mg by mouth daily.     donepezil (ARICEPT) 5 MG tablet Take 5 mg by mouth daily.       loratadine (CLARITIN) 10 MG tablet Take 10 mg by mouth daily.     memantine (NAMENDA) 10 MG tablet Take 10 mg by mouth 2 (two) times daily.     Olopatadine HCl 0.2 % SOLN Place 1 drop into both eyes daily.     pantoprazole (PROTONIX) 40 MG tablet Take 40 mg by mouth daily.     SYSTANE BALANCE 0.6 % SOLN Apply 1 drop to eye 4 (four) times daily.     venlafaxine XR (EFFEXOR-XR) 37.5 MG 24 hr capsule Take 37.5 mg by mouth daily.     verapamil (CALAN-SR) 180 MG CR tablet Take 180 mg by mouth 2 (two) times daily.      B Complex-C-Folic Acid (B-COMPLEX/FOLIC ACID/VITAMIN C) TBCR Take 1 tablet by mouth daily. (Patient not taking: Reported on 11/29/2022)     divalproex (DEPAKOTE) 125 MG DR tablet Take 125 mg by mouth 2 (two) times daily. (Patient not taking: Reported on 11/29/2022)     HYDROcodone-acetaminophen (NORCO/VICODIN) 5-325 MG tablet Take 1-2 tablets by mouth every 4 (four) hours as needed for moderate pain. (Patient not taking: Reported on 11/29/2022)  30 tablet 0   ibuprofen (ADVIL,MOTRIN) 200 MG tablet Take 600 mg by mouth 2 (two) times daily. (Patient not taking: Reported on 11/29/2022)     loratadine (CLARITIN) 10 MG tablet Take by mouth.     losartan (COZAAR) 50 MG tablet Take 1 tablet by mouth daily. (Patient not taking: Reported on 11/29/2022)     pantoprazole (PROTONIX) 40 MG tablet Take 1 tablet (40 mg total) by mouth daily for 14 days. 14 tablet 0   polyethylene glycol (MIRALAX) 17 g packet Take 17 g by mouth daily. (Patient not taking: Reported on 11/29/2022) 14 each 0   potassium chloride 20 MEQ/15ML (10%) SOLN SMARTSIG:Milliliter(s) By Mouth (Patient not taking: Reported on 11/29/2022)     senna (SENOKOT) 8.6 MG tablet Take 2 tablets by mouth daily. (Patient not taking: Reported on 11/29/2022)     No current facility-administered medications for this visit.      REVIEW OF SYSTEMS (Negative unless checked) Unable to really obtain from patient due to mental status.  Obtained from aid with her and previous medical record. Constitutional: []Weight loss  []Fever  []Chills Cardiac: [x]Chest pain   []Chest pressure   []Palpitations   []Shortness of breath when laying flat   []Shortness of breath at rest   []Shortness of breath with exertion. Vascular:  []Pain in legs with walking   []Pain in legs at rest   []Pain in legs when laying flat   []Claudication   []Pain in feet when walking  []Pain in feet at rest  []Pain in feet when laying flat   []History of DVT   []Phlebitis   []Swelling in legs   []Varicose veins   []Non-healing ulcers Pulmonary:   []Uses home oxygen   []Productive cough   []Hemoptysis   []Wheeze  []COPD   []Asthma Neurologic:  []Dizziness  []Blackouts   []Seizures   []History of stroke   []History of TIA  []Aphasia   []Temporary blindness   []Dysphagia   []Weakness or numbness in arms   []Weakness or numbness in legs Musculoskeletal:  []Arthritis   []Joint swelling   [x]Joint pain   []Low back pain Hematologic:  []Easy bruising   []Easy bleeding   []Hypercoagulable state   []Anemic  []Hepatitis Gastrointestinal:  []Blood in stool   []Vomiting blood  []Gastroesophageal reflux/heartburn   []Abdominal pain Genitourinary:  []Chronic kidney disease   []  Difficult urination  []Frequent urination  []Burning with urination   []Hematuria Skin:  []Rashes   []Ulcers   []Wounds Psychological:  []History of anxiety   [] History of major depression.    Physical Exam BP (!) 147/83 (BP Location: Right Arm)   Pulse 74   Resp 18   Ht 5' 4" (1.626 m)   Wt 113 lb 3.2 oz (51.3 kg)   BMI 19.43 kg/m  Gen:  WD/WN, NAD Head: /AT, + temporalis wasting.  Ear/Nose/Throat: Hearing grossly intact, nares w/o erythema or drainage, dentition very poor. Eyes: Conjunctiva clear, sclera non-icteric  Neck: trachea midline.  No JVD.  Pulmonary:  Good air movement, respirations not labored, no use of accessory muscles  Cardiac: RRR, no JVD Vascular:  Vessel Right Left  Radial Palpable Palpable                                   Gastrointestinal:. No masses, surgical incisions, or scars. Musculoskeletal: M/S 5/5 throughout.  Extremities without ischemic changes.  No deformity or atrophy. No LE edema. Neurologic: Sensation grossly intact in extremities.  Symmetrical.  Speech is fluent. Motor exam as listed above. Psychiatric: Judgment and insight are poor Dermatologic: No rashes or ulcers noted.  No cellulitis or open wounds.    Radiology CT Angio Chest/Abd/Pel for Dissection W and/or Wo Contrast  Result Date: 11/01/2022 CLINICAL DATA:  Left-sided chest pain. EXAM: CT ANGIOGRAPHY CHEST, ABDOMEN AND PELVIS TECHNIQUE: Non-contrast CT of the chest was initially obtained. Multidetector CT imaging through the chest, abdomen and pelvis was performed using the standard protocol during bolus administration of intravenous contrast. Multiplanar reconstructed images and MIPs were obtained and reviewed to evaluate the vascular anatomy. RADIATION  DOSE REDUCTION: This exam was performed according to the departmental dose-optimization program which includes automated exposure control, adjustment of the mA and/or kV according to patient size and/or use of iterative reconstruction technique. CONTRAST:  100mL OMNIPAQUE IOHEXOL 350 MG/ML SOLN COMPARISON:  CT abdomen pelvis dated June 29, 2020. CT chest dated September 12, 2016. FINDINGS: CTA CHEST FINDINGS Cardiovascular: No aortic intramural hematoma. No evidence of thoracic aortic aneurysm or dissection. Normal heart size. No pericardial effusion. No pulmonary embolism. Coronary, aortic arch, and branch vessel atherosclerotic vascular disease. Mediastinum/Nodes: No enlarged mediastinal, hilar, or axillary lymph nodes. Chronic mild circumferential esophageal wall thickening. Thyroid gland and trachea demonstrate no significant findings. Lungs/Pleura: No focal consolidation, pleural effusion, or pneumothorax. 1.0 x 0.4 cm subpleural nodule in the posterior right lower lobe is unchanged since 2018, benign. No follow-up imaging is recommended. No new pulmonary nodule. Musculoskeletal: No chest wall abnormality. No acute or significant osseous findings. Old nonunited distal left clavicle fracture. Review of the MIP images confirms the above findings. CTA ABDOMEN AND PELVIS FINDINGS VASCULAR Aorta: Normal caliber aorta without aneurysm, dissection, vasculitis or significant stenosis. Celiac: Patent without evidence of aneurysm, dissection, vasculitis or significant stenosis. SMA: Patent without evidence of aneurysm, dissection, vasculitis or significant stenosis. Renals: Both renal arteries are patent with moderate to severe stenoses of the origins due to atherosclerotic plaque. No evidence of aneurysm, dissection, vasculitis, or fibromuscular dysplasia. IMA: Patent without evidence of aneurysm, dissection, vasculitis or significant stenosis. Inflow: Patent without evidence of aneurysm, dissection, vasculitis or  significant stenosis. Veins: No obvious venous abnormality within the limitations of this arterial phase study. Review of the MIP images confirms the above findings. NON-VASCULAR Hepatobiliary: No focal liver abnormality is seen. No   gallstones, gallbladder wall thickening, or biliary dilatation. Pancreas: Unremarkable. No pancreatic ductal dilatation or surrounding inflammatory changes. Spleen: Normal in size without focal abnormality. Adrenals/Urinary Tract: Adrenal glands are unremarkable. Kidneys are normal, without renal calculi, solid lesion, or hydronephrosis. 3.4 cm left renal simple cyst. No follow-up imaging is recommended. Bladder is unremarkable. Stomach/Bowel: Small hiatal hernia. The stomach is otherwise within normal limits. No bowel wall thickening, distention, or surrounding inflammatory changes. History of appendectomy. Lymphatic: No enlarged abdominal or pelvic lymph nodes. Reproductive: Status post hysterectomy. No adnexal masses. Other: No abdominal wall hernia or abnormality. No abdominopelvic ascites. No pneumoperitoneum. Musculoskeletal: No acute or significant osseous findings. Chronic L1 inferior endplate compression deformity again noted. Old right parasymphyseal pubic rami fractures. Review of the MIP images confirms the above findings. IMPRESSION: 1. No evidence of acute aortic syndrome. No acute intrathoracic or intra-abdominal process. 2. Chronic mild circumferential esophageal wall thickening, suggestive of chronic esophagitis. 3. 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 Electronically Signed   By: William T Derry M.D.   On: 11/01/2022 21:25   CT HEAD WO CONTRAST (5MM)  Result Date: 11/01/2022 CLINICAL DATA:  Head trauma, minor (Age >= 65y) EXAM: CT HEAD WITHOUT CONTRAST TECHNIQUE: Contiguous axial images were obtained from the base of the skull through the vertex without intravenous contrast. RADIATION DOSE REDUCTION: This exam was performed according to  the departmental dose-optimization program which includes automated exposure control, adjustment of the mA and/or kV according to patient size and/or use of iterative reconstruction technique. COMPARISON:  12/28/2021 FINDINGS: Brain: There is atrophy and chronic small vessel disease changes. No acute intracranial abnormality. Specifically, no hemorrhage, hydrocephalus, mass lesion, acute infarction, or significant intracranial injury. Vascular: No hyperdense vessel or unexpected calcification. Skull: No acute calvarial abnormality. Sinuses/Orbits: No acute findings Other: None IMPRESSION: Atrophy, chronic microvascular disease. No acute intracranial abnormality. Electronically Signed   By: Kevin  Dover M.D.   On: 11/01/2022 21:19   DG Chest 2 View  Result Date: 11/01/2022 CLINICAL DATA:  Chest pain. EXAM: CHEST - 2 VIEW COMPARISON:  Dec 31, 2021. FINDINGS: The heart size and mediastinal contours are within normal limits. Both lungs are clear. Probable subacute to old distal left clavicular fracture is noted. IMPRESSION: No active cardiopulmonary disease. Probable subacute to old distal left clavicular fracture. Electronically Signed   By: James  Green Jr M.D.   On: 11/01/2022 18:13    Labs Recent Results (from the past 2160 hour(s))  Basic metabolic panel     Status: None   Collection Time: 11/01/22  5:49 PM  Result Value Ref Range   Sodium 137 135 - 145 mmol/L   Potassium 3.9 3.5 - 5.1 mmol/L   Chloride 104 98 - 111 mmol/L   CO2 24 22 - 32 mmol/L   Glucose, Bld 88 70 - 99 mg/dL    Comment: Glucose reference range applies only to samples taken after fasting for at least 8 hours.   BUN 18 8 - 23 mg/dL   Creatinine, Ser 0.55 0.44 - 1.00 mg/dL   Calcium 10.0 8.9 - 10.3 mg/dL   GFR, Estimated >60 >60 mL/min    Comment: (NOTE) Calculated using the CKD-EPI Creatinine Equation (2021)    Anion gap 9 5 - 15    Comment: Performed at [facility], [address] [place], [place] [address]   CBC     Status: Abnormal   Collection Time: 11/01/22  5:49 PM  Result Value Ref Range   WBC 8.2   4.0 - 10.5 K/uL   RBC 3.84 (L) 3.87 - 5.11 MIL/uL   Hemoglobin 11.6 (L) 12.0 - 15.0 g/dL   HCT 33.2 (L) 36.0 - 46.0 %   MCV 86.5 80.0 - 100.0 fL   MCH 30.2 26.0 - 34.0 pg   MCHC 34.9 30.0 - 36.0 g/dL   RDW 13.5 11.5 - 15.5 %   Platelets 241 150 - 400 K/uL   nRBC 0.0 0.0 - 0.2 %    Comment: Performed at [facility], [address] [place], [place] [address]  Troponin I (High Sensitivity)     Status: None   Collection Time: 11/01/22  5:49 PM  Result Value Ref Range   Troponin I (High Sensitivity) 4 <18 ng/L    Comment: (NOTE) Elevated high sensitivity troponin I (hsTnI) values and significant  changes across serial measurements may suggest ACS but many other  chronic and acute conditions are known to elevate hsTnI results.  Refer to the "Links" section for chest pain algorithms and additional  guidance. Performed at [facility], [address] [place], [place] [address]   Troponin I (High Sensitivity)     Status: None   Collection Time: 11/01/22  8:20 PM  Result Value Ref Range   Troponin I (High Sensitivity) 5 <18 ng/L    Comment: (NOTE) Elevated high sensitivity troponin I (hsTnI) values and significant  changes across serial measurements may suggest ACS but many other  chronic and acute conditions are known to elevate hsTnI results.  Refer to the "Links" section for chest pain algorithms and additional  guidance. Performed at [facility], [address] [place], [place] [address]   Hepatic function panel     Status: None   Collection Time: 11/01/22  8:20 PM  Result Value Ref Range   Total Protein 7.1 6.5 - 8.1 g/dL   Albumin 3.9 3.5 - 5.0 g/dL   AST 23 15 - 41 U/L   ALT 14 0 - 44 U/L   Alkaline Phosphatase 104 38 - 126 U/L   Total Bilirubin 0.9 0.3 - 1.2 mg/dL   Bilirubin, Direct <0.1 0.0 - 0.2 mg/dL   Indirect Bilirubin NOT  CALCULATED 0.3 - 0.9 mg/dL    Comment: Performed at [facility], [address] [place], [place] [address]  Lipase, blood     Status: None   Collection Time: 11/01/22  8:20 PM  Result Value Ref Range   Lipase 46 11 - 51 U/L    Comment: Performed at [facility], [address] [place], [place] [address]  Urinalysis, Routine w reflex microscopic -Urine, Clean Catch     Status: Abnormal   Collection Time: 11/01/22  9:27 PM  Result Value Ref Range   Color, Urine YELLOW (A) YELLOW   APPearance CLOUDY (A) CLEAR   Specific Gravity, Urine 1.033 (H) 1.005 - 1.030   pH 8.0 5.0 - 8.0   Glucose, UA NEGATIVE NEGATIVE mg/dL   Hgb urine dipstick NEGATIVE NEGATIVE   Bilirubin Urine NEGATIVE NEGATIVE   Ketones, ur NEGATIVE NEGATIVE mg/dL   Protein, ur NEGATIVE NEGATIVE mg/dL   Nitrite NEGATIVE NEGATIVE   Leukocytes,Ua NEGATIVE NEGATIVE    Comment: Performed at [facility], [address] , [place] [address]    Assessment/Plan:  Renal artery stenosis I have independently reviewed her CT angiogram from last month.  Although CT is not an ideal study for renal stenosis, there does appear to be hemodynamically significant disease in both   renal arteries.  Given her clinical scenario of poorly controlled hypertension, consideration for renal artery intervention should be given.  I discussed the situation with the facility nurse who is in agreement.  The patient's brother will need to provide consent and if he is agreeable in proceeding with renal artery angiogram and possible stent placement will be planned in the near future to try to improve her blood pressure and maintain her renal function.  Hypertension, renovascular Blood pressure control is suboptimal. I have independently reviewed her CT angiogram from last month.  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  Given  her clinical scenario of poorly controlled hypertension, consideration for renal artery intervention should be given.  I discussed the situation with the facility nurse who is in agreement.  The patient's brother will need to provide consent and if he is agreeable in proceeding with renal artery angiogram and possible stent placement will be planned in the near future to try to improve her blood pressure and maintain her renal function.      Gwyn Mehring 11/29/2022, 11:58 AM   This note was created with Dragon medical transcription system.  Any errors from dictation are unintentional.   

## 2022-11-29 NOTE — Progress Notes (Signed)
Patient ID: Melody Stone, female   DOB: 25-Mar-1951, 72 y.o.   MRN: [id number]  Chief Complaint  Patient presents with   New Patient (Initial Visit)    NP consult; No referral - seen in ED 3.4.24    HPI Melody Stone is a 72 y.o. female.  I am asked to see the patient by Dr. Jari Pigg in the [facility] ER for evaluation of renal artery stenosis in the setting of severe hypertension.  She presented to the emergency room with hypertensive urgency.  Her blood pressure has gotten under some better control but remains elevated.  In talking with the nurse from her facility, this has been an ongoing and recurrent for her.  She has these hypertensive urgency, she gets chest pain and other issues as well.  She cannot provide any of the history with her mental status.  I have independently reviewed her CT angiogram from last month.  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   Past Medical History:  Diagnosis Date   Anemia    Aortic insufficiency    a. 10/2015 Echo: Nl LV fxn, mild to moderate AI; b. 02/2017 Echo: EF 60-65%, no rwma, mild AI.   Chest pain    a. 10/2015 Ex MV Jefm Bryant): EF 69%, no ischemia; b. 01/2017 Lexiscan MV: EF 45%, no ischemia/infarct-->Low risk.   Dementia    Dyspnea on exertion    Hepatitis C    HLD (hyperlipidemia)    Hypertension    Mental retardation    Reflux esophagitis     Past Surgical History:  Procedure Laterality Date   ABDOMINAL HYSTERECTOMY     APPENDECTOMY       Family History  Problem Relation Age of Onset   Heart attack Mother        CABG in her 12's   Cancer Mother    CAD Brother        86   Hypertension Brother        62      Social History   Tobacco Use   Smoking status: Never   Smokeless tobacco: Never  Vaping Use   Vaping Use: Never used  Substance Use Topics   Alcohol use: No   Drug use: No  Lives in a group home  Allergies   Allergen Reactions   Other Other (See Comments)    Psychotropic drugs cause kidney failure. Psychotropic drug cause renal failure. Psychotropic drugs cause kidney failure.   Ciprofloxacin Rash    Per caregiver.    Current Outpatient Medications  Medication Sig Dispense Refill   acetaminophen (TYLENOL) 325 MG tablet Take 2 tablets (650 mg total) by mouth every 6 (six) hours. 30 tablet 0   Aloe Vera OIL by Does not apply route. Cream     Brimonidine Tartrate (LUMIFY) 0.025 % SOLN Apply to eye.     chlorhexidine (PERIDEX) 0.12 % solution SMARTSIG:By Mouth     Cholecalciferol (VITAMIN D3) 1000 units CAPS Take 2,000 Units by mouth daily.      clonazePAM (KLONOPIN) 0.5 MG tablet Take 0.25-0.5 mg by mouth See admin instructions. Take  tablet (0.25mg ) by mouth every morning and 1 tablet (1mg ) by mouth every evening     docusate sodium (COLACE) 100 MG capsule Take 200 mg by mouth daily.     donepezil (ARICEPT) 5 MG tablet Take 5 mg by mouth daily.  loratadine (CLARITIN) 10 MG tablet Take 10 mg by mouth daily.     memantine (NAMENDA) 10 MG tablet Take 10 mg by mouth 2 (two) times daily.     Olopatadine HCl 0.2 % SOLN Place 1 drop into both eyes daily.     pantoprazole (PROTONIX) 40 MG tablet Take 40 mg by mouth daily.     SYSTANE BALANCE 0.6 % SOLN Apply 1 drop to eye 4 (four) times daily.     venlafaxine XR (EFFEXOR-XR) 37.5 MG 24 hr capsule Take 37.5 mg by mouth daily.     verapamil (CALAN-SR) 180 MG CR tablet Take 180 mg by mouth 2 (two) times daily.      B Complex-C-Folic Acid (B-COMPLEX/FOLIC ACID/VITAMIN C) TBCR Take 1 tablet by mouth daily. (Patient not taking: Reported on 11/29/2022)     divalproex (DEPAKOTE) 125 MG DR tablet Take 125 mg by mouth 2 (two) times daily. (Patient not taking: Reported on 11/29/2022)     HYDROcodone-acetaminophen (NORCO/VICODIN) 5-325 MG tablet Take 1-2 tablets by mouth every 4 (four) hours as needed for moderate pain. (Patient not taking: Reported on 11/29/2022)  30 tablet 0   ibuprofen (ADVIL,MOTRIN) 200 MG tablet Take 600 mg by mouth 2 (two) times daily. (Patient not taking: Reported on 11/29/2022)     loratadine (CLARITIN) 10 MG tablet Take by mouth.     losartan (COZAAR) 50 MG tablet Take 1 tablet by mouth daily. (Patient not taking: Reported on 11/29/2022)     pantoprazole (PROTONIX) 40 MG tablet Take 1 tablet (40 mg total) by mouth daily for 14 days. 14 tablet 0   polyethylene glycol (MIRALAX) 17 g packet Take 17 g by mouth daily. (Patient not taking: Reported on 11/29/2022) 14 each 0   potassium chloride 20 MEQ/15ML (10%) SOLN SMARTSIG:Milliliter(s) By Mouth (Patient not taking: Reported on 11/29/2022)     senna (SENOKOT) 8.6 MG tablet Take 2 tablets by mouth daily. (Patient not taking: Reported on 11/29/2022)     No current facility-administered medications for this visit.      REVIEW OF SYSTEMS (Negative unless checked) Unable to really obtain from patient due to mental status.  Obtained from aid with her and previous medical record. Constitutional: [] Weight loss  [] Fever  [] Chills Cardiac: [x] Chest pain   [] Chest pressure   [] Palpitations   [] Shortness of breath when laying flat   [] Shortness of breath at rest   [] Shortness of breath with exertion. Vascular:  [] Pain in legs with walking   [] Pain in legs at rest   [] Pain in legs when laying flat   [] Claudication   [] Pain in feet when walking  [] Pain in feet at rest  [] Pain in feet when laying flat   [] History of DVT   [] Phlebitis   [] Swelling in legs   [] Varicose veins   [] Non-healing ulcers Pulmonary:   [] Uses home oxygen   [] Productive cough   [] Hemoptysis   [] Wheeze  [] COPD   [] Asthma Neurologic:  [] Dizziness  [] Blackouts   [] Seizures   [] History of stroke   [] History of TIA  [] Aphasia   [] Temporary blindness   [] Dysphagia   [] Weakness or numbness in arms   [] Weakness or numbness in legs Musculoskeletal:  [] Arthritis   [] Joint swelling   [x] Joint pain   [] Low back pain Hematologic:  [] Easy bruising   [] Easy bleeding   [] Hypercoagulable state   [] Anemic  [] Hepatitis Gastrointestinal:  [] Blood in stool   [] Vomiting blood  [] Gastroesophageal reflux/heartburn   [] Abdominal pain Genitourinary:  [] Chronic kidney disease   []   Difficult urination  [] Frequent urination  [] Burning with urination   [] Hematuria Skin:  [] Rashes   [] Ulcers   [] Wounds Psychological:  [] History of anxiety   []  History of major depression.    Physical Exam BP (!) 147/83 (BP Location: Right Arm)   Pulse 74   Resp 18   Ht 5\' 4"  (1.626 m)   Wt 113 lb 3.2 oz (51.3 kg)   BMI 19.43 kg/m  Gen:  WD/WN, NAD Head: Hardy/AT, + temporalis wasting.  Ear/Nose/Throat: Hearing grossly intact, nares w/o erythema or drainage, dentition very poor. Eyes: Conjunctiva clear, sclera non-icteric  Neck: trachea midline.  No JVD.  Pulmonary:  Good air movement, respirations not labored, no use of accessory muscles  Cardiac: RRR, no JVD Vascular:  Vessel Right Left  Radial Palpable Palpable                                   Gastrointestinal:. No masses, surgical incisions, or scars. Musculoskeletal: M/S 5/5 throughout.  Extremities without ischemic changes.  No deformity or atrophy. No LE edema. Neurologic: Sensation grossly intact in extremities.  Symmetrical.  Speech is fluent. Motor exam as listed above. Psychiatric: Judgment and insight are poor Dermatologic: No rashes or ulcers noted.  No cellulitis or open wounds.    Radiology CT Angio Chest/Abd/Pel for Dissection W and/or Wo Contrast  Result Date: 11/01/2022 CLINICAL DATA:  Left-sided chest pain. EXAM: CT ANGIOGRAPHY CHEST, ABDOMEN AND PELVIS TECHNIQUE: Non-contrast CT of the chest was initially obtained. Multidetector CT imaging through the chest, abdomen and pelvis was performed using the standard protocol during bolus administration of intravenous contrast. Multiplanar reconstructed images and MIPs were obtained and reviewed to evaluate the vascular anatomy. RADIATION  DOSE REDUCTION: This exam was performed according to the departmental dose-optimization program which includes automated exposure control, adjustment of the mA and/or kV according to patient size and/or use of iterative reconstruction technique. CONTRAST:  170mL OMNIPAQUE IOHEXOL 350 MG/ML SOLN COMPARISON:  CT abdomen pelvis dated June 29, 2020. CT chest dated September 12, 2016. FINDINGS: CTA CHEST FINDINGS Cardiovascular: No aortic intramural hematoma. No evidence of thoracic aortic aneurysm or dissection. Normal heart size. No pericardial effusion. No pulmonary embolism. Coronary, aortic arch, and branch vessel atherosclerotic vascular disease. Mediastinum/Nodes: No enlarged mediastinal, hilar, or axillary lymph nodes. Chronic mild circumferential esophageal wall thickening. Thyroid gland and trachea demonstrate no significant findings. Lungs/Pleura: No focal consolidation, pleural effusion, or pneumothorax. 1.0 x 0.4 cm subpleural nodule in the posterior right lower lobe is unchanged since 2018, benign. No follow-up imaging is recommended. No new pulmonary nodule. Musculoskeletal: No chest wall abnormality. No acute or significant osseous findings. Old nonunited distal left clavicle fracture. Review of the MIP images confirms the above findings. CTA ABDOMEN AND PELVIS FINDINGS VASCULAR Aorta: Normal caliber aorta without aneurysm, dissection, vasculitis or significant stenosis. Celiac: Patent without evidence of aneurysm, dissection, vasculitis or significant stenosis. SMA: Patent without evidence of aneurysm, dissection, vasculitis or significant stenosis. Renals: Both renal arteries are patent with moderate to severe stenoses of the origins due to atherosclerotic plaque. No evidence of aneurysm, dissection, vasculitis, or fibromuscular dysplasia. IMA: Patent without evidence of aneurysm, dissection, vasculitis or significant stenosis. Inflow: Patent without evidence of aneurysm, dissection, vasculitis or  significant stenosis. Veins: No obvious venous abnormality within the limitations of this arterial phase study. Review of the MIP images confirms the above findings. NON-VASCULAR Hepatobiliary: No focal liver abnormality is seen. No  gallstones, gallbladder wall thickening, or biliary dilatation. Pancreas: Unremarkable. No pancreatic ductal dilatation or surrounding inflammatory changes. Spleen: Normal in size without focal abnormality. Adrenals/Urinary Tract: Adrenal glands are unremarkable. Kidneys are normal, without renal calculi, solid lesion, or hydronephrosis. 3.4 cm left renal simple cyst. No follow-up imaging is recommended. Bladder is unremarkable. Stomach/Bowel: Small hiatal hernia. The stomach is otherwise within normal limits. No bowel wall thickening, distention, or surrounding inflammatory changes. History of appendectomy. Lymphatic: No enlarged abdominal or pelvic lymph nodes. Reproductive: Status post hysterectomy. No adnexal masses. Other: No abdominal wall hernia or abnormality. No abdominopelvic ascites. No pneumoperitoneum. Musculoskeletal: No acute or significant osseous findings. Chronic L1 inferior endplate compression deformity again noted. Old right parasymphyseal pubic rami fractures. Review of the MIP images confirms the above findings. IMPRESSION: 1. No evidence of acute aortic syndrome. No acute intrathoracic or intra-abdominal process. 2. Chronic mild circumferential esophageal wall thickening, suggestive of chronic esophagitis. 3. 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 Electronically Signed   By: Titus Dubin M.D.   On: 11/01/2022 21:25   CT HEAD WO CONTRAST (5MM)  Result Date: 11/01/2022 CLINICAL DATA:  Head trauma, minor (Age >= 65y) EXAM: CT HEAD WITHOUT CONTRAST TECHNIQUE: Contiguous axial images were obtained from the base of the skull through the vertex without intravenous contrast. RADIATION DOSE REDUCTION: This exam was performed according to  the departmental dose-optimization program which includes automated exposure control, adjustment of the mA and/or kV according to patient size and/or use of iterative reconstruction technique. COMPARISON:  12/28/2021 FINDINGS: Brain: There is atrophy and chronic small vessel disease changes. No acute intracranial abnormality. Specifically, no hemorrhage, hydrocephalus, mass lesion, acute infarction, or significant intracranial injury. Vascular: No hyperdense vessel or unexpected calcification. Skull: No acute calvarial abnormality. Sinuses/Orbits: No acute findings Other: None IMPRESSION: Atrophy, chronic microvascular disease. No acute intracranial abnormality. Electronically Signed   By: Rolm Baptise M.D.   On: 11/01/2022 21:19   DG Chest 2 View  Result Date: 11/01/2022 CLINICAL DATA:  Chest pain. EXAM: CHEST - 2 VIEW COMPARISON:  Dec 31, 2021. FINDINGS: The heart size and mediastinal contours are within normal limits. Both lungs are clear. Probable subacute to old distal left clavicular fracture is noted. IMPRESSION: No active cardiopulmonary disease. Probable subacute to old distal left clavicular fracture. Electronically Signed   By: Marijo Conception M.D.   On: 11/01/2022 18:13    Labs Recent Results (from the past 2160 hour(s))  Basic metabolic panel     Status: None   Collection Time: 11/01/22  5:49 PM  Result Value Ref Range   Sodium 137 135 - 145 mmol/L   Potassium 3.9 3.5 - 5.1 mmol/L   Chloride 104 98 - 111 mmol/L   CO2 24 22 - 32 mmol/L   Glucose, Bld 88 70 - 99 mg/dL    Comment: Glucose reference range applies only to samples taken after fasting for at least 8 hours.   BUN 18 8 - 23 mg/dL   Creatinine, Ser 0.55 0.44 - 1.00 mg/dL   Calcium 10.0 8.9 - 10.3 mg/dL   GFR, Estimated >60 >60 mL/min    Comment: (NOTE) Calculated using the CKD-EPI Creatinine Equation (2021)    Anion gap 9 5 - 15    Comment: Performed at [facility], [address] [place],  [address]   CBC     Status: Abnormal   Collection Time: 11/01/22  5:49 PM  Result Value Ref Range   WBC 8.2  4.0 - 10.5 K/uL   RBC 3.84 (L) 3.87 - 5.11 MIL/uL   Hemoglobin 11.6 (L) 12.0 - 15.0 g/dL   HCT 33.2 (L) 36.0 - 46.0 %   MCV 86.5 80.0 - 100.0 fL   MCH 30.2 26.0 - 34.0 pg   MCHC 34.9 30.0 - 36.0 g/dL   RDW 13.5 11.5 - 15.5 %   Platelets 241 150 - 400 K/uL   nRBC 0.0 0.0 - 0.2 %    Comment: Performed at [facility] [address] [place], [place] [address]  Troponin I (High Sensitivity)     Status: None   Collection Time: 11/01/22  5:49 PM  Result Value Ref Range   Troponin I (High Sensitivity) 4 <18 ng/L    Comment: (NOTE) Elevated high sensitivity troponin I (hsTnI) values and significant  changes across serial measurements may suggest ACS but many other  chronic and acute conditions are known to elevate hsTnI results.  Refer to the "Links" section for chest pain algorithms and additional  guidance. Performed at [facility], [place], [place] [address]   Troponin I (High Sensitivity)     Status: None   Collection Time: 11/01/22  8:20 PM  Result Value Ref Range   Troponin I (High Sensitivity) 5 <18 ng/L    Comment: (NOTE) Elevated high sensitivity troponin I (hsTnI) values and significant  changes across serial measurements may suggest ACS but many other  chronic and acute conditions are known to elevate hsTnI results.  Refer to the "Links" section for chest pain algorithms and additional  guidance. Performed at [facility], [address] [place], [place] [address]   Hepatic function panel     Status: None   Collection Time: 11/01/22  8:20 PM  Result Value Ref Range   Total Protein 7.1 6.5 - 8.1 g/dL   Albumin 3.9 3.5 - 5.0 g/dL   AST 23 15 - 41 U/L   ALT 14 0 - 44 U/L   Alkaline Phosphatase 104 38 - 126 U/L   Total Bilirubin 0.9 0.3 - 1.2 mg/dL   Bilirubin, Direct <0.1 0.0 - 0.2 mg/dL   Indirect Bilirubin NOT  CALCULATED 0.3 - 0.9 mg/dL    Comment: Performed at [facility], [address] [place], [place] [address]  Lipase, blood     Status: None   Collection Time: 11/01/22  8:20 PM  Result Value Ref Range   Lipase 46 11 - 51 U/L    Comment: Performed at [facility], [address] [place], [place] [address]  Urinalysis, Routine w reflex microscopic -Urine, Clean Catch     Status: Abnormal   Collection Time: 11/01/22  9:27 PM  Result Value Ref Range   Color, Urine YELLOW (A) YELLOW   APPearance CLOUDY (A) CLEAR   Specific Gravity, Urine 1.033 (H) 1.005 - 1.030   pH 8.0 5.0 - 8.0   Glucose, UA NEGATIVE NEGATIVE mg/dL   Hgb urine dipstick NEGATIVE NEGATIVE   Bilirubin Urine NEGATIVE NEGATIVE   Ketones, ur NEGATIVE NEGATIVE mg/dL   Protein, ur NEGATIVE NEGATIVE mg/dL   Nitrite NEGATIVE NEGATIVE   Leukocytes,Ua NEGATIVE NEGATIVE    Comment: Performed at [facility], [address] [place], [place] [address]    Assessment/Plan:  Renal artery stenosis I have independently reviewed her CT angiogram from last month.  Although CT is not an ideal study for renal stenosis, there does appear to be hemodynamically significant disease in both  renal arteries.  Given her clinical scenario of poorly controlled hypertension, consideration for renal artery intervention should be given.  I discussed the situation with the facility nurse who is in agreement.  The patient's brother will need to provide consent and if he is agreeable in proceeding with renal artery angiogram and possible stent placement will be planned in the near future to try to improve her blood pressure and maintain her renal function.  Hypertension, renovascular Blood pressure control is suboptimal. I have independently reviewed her CT angiogram from last month.  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  Given  her clinical scenario of poorly controlled hypertension, consideration for renal artery intervention should be given.  I discussed the situation with the facility nurse who is in agreement.  The patient's brother will need to provide consent and if he is agreeable in proceeding with renal artery angiogram and possible stent placement will be planned in the near future to try to improve her blood pressure and maintain her renal function.      Leotis Pain 11/29/2022, 11:58 AM   This note was created with Dragon medical transcription system.  Any errors from dictation are unintentional.

## 2022-11-29 NOTE — Assessment & Plan Note (Signed)
I have independently reviewed her CT angiogram from last month.  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  Given her clinical scenario of poorly controlled hypertension, consideration for renal artery intervention should be given.  I discussed the situation with the facility nurse who is in agreement.  The patient's brother will need to provide consent and if he is agreeable in proceeding with renal artery angiogram and possible stent placement will be planned in the near future to try to improve her blood pressure and maintain her renal function.

## 2022-12-05 ENCOUNTER — Telehealth (INDEPENDENT_AMBULATORY_CARE_PROVIDER_SITE_OTHER): Payer: Self-pay

## 2022-12-05 NOTE — Telephone Encounter (Signed)
I attempted to contact the patient and guardian-brother to schedule the patient for a renal angio. A message was left with both for a return call.

## 2022-12-05 NOTE — Telephone Encounter (Signed)
Patient's guardian called back and gave me the number to the patient's group home [phone]. I called the group home and was given another number to call someone by the name of Swan at [phone]. A message was left for a return call.

## 2022-12-06 NOTE — Telephone Encounter (Signed)
Spoke with the patient's caregiver at the group home [address]. The patient is scheduled with Dr. Wyn Quaker on 12/15/22 with a 1:00 pm arrival to the [facility] for a renal stent placement. Pre-procedure instructions were discussed and will be faxed to [phone] attention Chrissie Noa per request.

## 2022-12-15 ENCOUNTER — Encounter: Payer: Self-pay | Admitting: Vascular Surgery

## 2022-12-15 ENCOUNTER — Ambulatory Visit
Admission: RE | Admit: 2022-12-15 | Discharge: 2022-12-15 | Disposition: A | Discharge status: Home / Self Care | Payer: Medicare Other | Attending: Vascular Surgery | Admitting: Vascular Surgery

## 2022-12-15 ENCOUNTER — Encounter
Admission: RE | Disposition: A | Discharge status: Home / Self Care | Payer: Self-pay | Source: Home / Self Care | Attending: Vascular Surgery

## 2022-12-15 DIAGNOSIS — I150 Renovascular hypertension: Secondary | ICD-10-CM

## 2022-12-15 DIAGNOSIS — I701 Atherosclerosis of renal artery: Secondary | ICD-10-CM

## 2022-12-15 HISTORY — DX: Panic disorder (episodic paroxysmal anxiety): F41.0

## 2022-12-15 HISTORY — DX: Esophagitis, unspecified without bleeding: K20.90

## 2022-12-15 HISTORY — DX: Anxiety disorder, unspecified: F41.9

## 2022-12-15 HISTORY — DX: Unspecified cataract: H26.9

## 2022-12-15 HISTORY — DX: Intermittent explosive disorder: F63.81

## 2022-12-15 HISTORY — PX: RENAL ANGIOGRAPHY: CATH118260

## 2022-12-15 HISTORY — DX: Unspecified kidney failure: N19

## 2022-12-15 LAB — CREATININE, SERUM
Creatinine, Ser: 0.52 mg/dL (ref 0.44–1.00)
GFR, Estimated: >60 mL/min (ref >60)

## 2022-12-15 LAB — GLUCOSE, CAPILLARY: Glucose-Capillary: 79 mg/dL (ref 70–99)

## 2022-12-15 LAB — BUN: BUN: 12 mg/dL (ref 8–23)

## 2022-12-15 SURGERY — RENAL ANGIOGRAPHY
Anesthesia: Moderate Sedation

## 2022-12-15 MED ORDER — HEPARIN SODIUM (PORCINE) 1000 UNIT/ML IJ SOLN
INTRAMUSCULAR | Status: AC
  Filled 2022-12-15: qty 10

## 2022-12-15 MED ORDER — HYDROMORPHONE HCL 1 MG/ML IJ SOLN: 1.0000 mg | Freq: Once | INTRAMUSCULAR | Status: DC | PRN

## 2022-12-15 MED ORDER — FENTANYL CITRATE (PF) 100 MCG/2ML IJ SOLN
INTRAMUSCULAR | Status: DC | PRN
  Administered 2022-12-15: 25 ug via INTRAVENOUS

## 2022-12-15 MED ORDER — METHYLPREDNISOLONE SODIUM SUCC 125 MG IJ SOLR: 125.0000 mg | Freq: Once | INTRAMUSCULAR | Status: DC | PRN

## 2022-12-15 MED ORDER — MIDAZOLAM HCL 2 MG/ML PO SYRP
8.0000 mg | ORAL_SOLUTION | Freq: Once | ORAL | Status: AC | PRN
  Administered 2022-12-15: 8 mg via ORAL

## 2022-12-15 MED ORDER — CEFAZOLIN SODIUM-DEXTROSE 2-4 GM/100ML-% IV SOLN
2.0000 g | INTRAVENOUS | Status: AC
  Administered 2022-12-15: 2 g via INTRAVENOUS

## 2022-12-15 MED ORDER — MIDAZOLAM HCL 2 MG/2ML IJ SOLN
INTRAMUSCULAR | Status: DC | PRN
  Administered 2022-12-15: 1 mg via INTRAVENOUS

## 2022-12-15 MED ORDER — HEPARIN SODIUM (PORCINE) 1000 UNIT/ML IJ SOLN
INTRAMUSCULAR | Status: DC | PRN
  Administered 2022-12-15: 4000 [IU] via INTRAVENOUS

## 2022-12-15 MED ORDER — ATORVASTATIN CALCIUM 10 MG PO TABS: 10.0000 mg | ORAL_TABLET | Freq: Every day | ORAL | 11 refills | Status: DC

## 2022-12-15 MED ORDER — FAMOTIDINE 20 MG PO TABS: 40.0000 mg | ORAL_TABLET | Freq: Once | ORAL | Status: DC | PRN

## 2022-12-15 MED ORDER — CLOPIDOGREL BISULFATE 75 MG PO TABS: 75.0000 mg | ORAL_TABLET | Freq: Every day | ORAL | 11 refills | Status: DC

## 2022-12-15 MED ORDER — ASPIRIN 81 MG PO TBEC: 81.0000 mg | DELAYED_RELEASE_TABLET | Freq: Every day | ORAL | 2 refills | Status: AC

## 2022-12-15 MED ORDER — CEFAZOLIN SODIUM-DEXTROSE 2-4 GM/100ML-% IV SOLN
INTRAVENOUS | Status: AC
  Filled 2022-12-15: qty 100

## 2022-12-15 MED ORDER — FENTANYL CITRATE PF 50 MCG/ML IJ SOSY
PREFILLED_SYRINGE | INTRAMUSCULAR | Status: AC
  Filled 2022-12-15: qty 1

## 2022-12-15 MED ORDER — MIDAZOLAM HCL 2 MG/2ML IJ SOLN
INTRAMUSCULAR | Status: AC
  Filled 2022-12-15: qty 2

## 2022-12-15 MED ORDER — DIPHENHYDRAMINE HCL 50 MG/ML IJ SOLN: 50.0000 mg | Freq: Once | INTRAMUSCULAR | Status: DC | PRN

## 2022-12-15 MED ORDER — SODIUM CHLORIDE 0.9 % IV SOLN: INTRAVENOUS | Status: DC

## 2022-12-15 MED ORDER — MIDAZOLAM HCL 2 MG/ML PO SYRP
ORAL_SOLUTION | ORAL | Status: AC
  Filled 2022-12-15: qty 5

## 2022-12-15 MED ORDER — ONDANSETRON HCL 4 MG/2ML IJ SOLN: 4.0000 mg | Freq: Four times a day (QID) | INTRAMUSCULAR | Status: DC | PRN

## 2022-12-15 SURGICAL SUPPLY — 14 items
CATH ANGIO 5F PIGTAIL 65CM (CATHETERS) ×1
CATH VS15FR (CATHETERS) ×1
COVER PROBE ULTRASOUND 5X96 (MISCELLANEOUS) ×1
DEVICE STARCLOSE SE CLOSURE (Vascular Products) ×1 IMPLANT
GLIDEWIRE STIFF .35X180X3 HYDR (WIRE) ×1
KIT ENCORE 26 ADVANTAGE (KITS) ×1
PACK ANGIOGRAPHY (CUSTOM PROCEDURE TRAY) ×1
SHEATH ANL2 6FRX45 HC (SHEATH) ×1
SHEATH BRITE TIP 5FRX11 (SHEATH) ×1
STENT LIFESTREAM 6X16X80 (Permanent Stent) ×2 IMPLANT
SYR MEDRAD MARK 7 150ML (SYRINGE) ×1
TUBING CONTRAST HIGH PRESS 72 (TUBING) ×1
WIRE GUIDERIGHT .035X150 (WIRE) IMPLANT
WIRE SUPRACORE 190CM (WIRE) ×1

## 2022-12-15 NOTE — Op Note (Signed)
[facility]  Percutaneous Study/Intervention Procedural Note    Surgeon(s): [employer]   Assistants: None  Pre-operative Diagnosis: bilateral renal artery stenosis, renovascular hypertension  Post-operative diagnosis:  Same  Procedure(s) Performed:             1.  Ultrasound guidance for vascular access right femoral artery             2.  Catheter placement into left renal artery from right femoral approach             3.  Aortogram and selective left renal angiogram             4.  Balloon expandable stent placement to the left renal artery with a pair of 6 mm diameter x 16 strength mm length stents             5.  StarClose closure device right femoral artery              Contrast: 30 cc  EBL: 5 cc   Fluoro Time: 3.6 minutes  Moderate conscious sedation: Approximately 23 minutes with 1 mg of Versed and 25 mcg of Fentanyl  Indications:  The patient is a 72 year old female with worsening severe hypertension despite multiple antihypertensives. The patient has suboptimal blood pressure control despite multiple antihypertensives and a CT scan suggesting significant bilateral renal artery stenosis. Given the clinical scenario and the noninvasive findings, angiogram is indicated for further evaluation of her renal artery and potential treatment. Risks and benefits are discussed and informed consent is obtained.  Procedure:  The patient was identified and appropriate procedural time out was performed.  The patient was then placed supine on the table and prepped and draped in the usual sterile fashion. Moderate conscious sedation was administered with a face to face encounter with the patient throughout the procedure with my supervision of the RN administering medicines and monitoring the patients vital signs and mental status throughout from the start of the procedure until the patient was taken to the recovery room  Ultrasound was used to evaluate the right common  femoral artery.  It was patent .  A digital ultrasound image was acquired.  A Seldinger needle was used to access the right common femoral artery under direct ultrasound guidance and a permanent image was performed.  A 0.035 J wire was advanced without resistance and a 5Fr sheath was placed.  Pigtail catheter was placed into the aorta at the L1 level and an AP aortogram was performed. This demonstrated fairly normal aorta and iliac vessels.  On the aortogram, both renal arteries were well-seen and both appeared to have 80% or greater stenosis. The patient was then systemically heparinized with 4000 units of intravenous heparin. I used a V S1 catheter to cannulate the left renal artery and selective imaging was performed. This confirmed a 80 to 85% left renal artery stenosis.  At this point I selected the glide wire and crossed the lesion without difficulty.  The V S1 catheter was then advanced well past the lesion I exchanged for a supra core wire and a 6 [place] Ansell sheath was placed over the supra core wire into the proximal left renal artery. I then selected a 6 mm diameter x 16 mm length balloon expandable stent and brought this across the lesion.  The stent was slightly short but the next length covered stent available would have been a little too long and potentially got out beyond the bifurcation of her short  main renal artery.  This was deployed just proximal to the bifurcation of her main renal artery about 3 to 4 mm into the renal artery.  I then brought a second 6 mm diameter by 16 mm length balloon expandable Lifestream stent into the previous stent and deployed this.  This was deployed encompassing the lesion with its proximal extent going back into the aorta for a mm or two.  This was inflated to 12 ATM and the waist resolved.  Completion angiogram showed a widely patent left renal artery stent with no significant residual stenosis.  The guide catheter was removed. Oblique arteriogram was performed  of the right femoral artery and StarClose closure device was deployed in the usual fashion with excellent hemostatic result. The patient was taken to the recovery room in stable condition having tolerated the procedure well.  Findings:               Aortogram/Renal Arteries: Fairly normal aorta and iliac arteries.  Both renal arteries had 80% or greater stenosis.  Left renal artery treated today.   Condition:  Stable  Complications: None   Festus Barren 12/15/2022 3:20 PM  This note was created with Dragon Medical transcription system. Any errors in dictation are purely unintentional.

## 2022-12-15 NOTE — Interval H&P Note (Signed)
History and Physical Interval Note:  12/15/2022 1:44 PM  Melody Stone  has presented today for surgery, with the diagnosis of Renal Stent    Renal artery stenosis.  The various methods of treatment have been discussed with the patient and family. After consideration of risks, benefits and other options for treatment, the patient has consented to  Procedure(s): RENAL ANGIOGRAPHY (N/A) as a surgical intervention.  The patient's history has been reviewed, patient examined, no change in status, stable for surgery.  I have reviewed the patient's chart and labs.  Questions were answered to the patient's satisfaction.     Festus Barren

## 2022-12-15 NOTE — Progress Notes (Signed)
Patient received from [facility] on [facility] .Melody Stone With only a blank physician order sheet with [facility] on it, no phone number, no medication record, medical history or any other information.  The caregiver with the patient gave me the wrong phone number for the facility, I looked the number up and called spoke with Will who told me he would fax the [facility] to [place] called back x3 finally received MAR from 3/1 - 3/31 called back for a 4th time and he said he was not at the facility and could not get the morst recent one  So latest medication administration is unknown and so is her medication list.

## 2022-12-16 ENCOUNTER — Encounter: Payer: Self-pay | Admitting: Vascular Surgery

## 2023-01-11 ENCOUNTER — Other Ambulatory Visit (INDEPENDENT_AMBULATORY_CARE_PROVIDER_SITE_OTHER): Payer: Self-pay | Admitting: Vascular Surgery

## 2023-01-11 DIAGNOSIS — I701 Atherosclerosis of renal artery: Secondary | ICD-10-CM

## 2023-01-11 DIAGNOSIS — Z959 Presence of cardiac and vascular implant and graft, unspecified: Secondary | ICD-10-CM

## 2023-01-13 ENCOUNTER — Ambulatory Visit (INDEPENDENT_AMBULATORY_CARE_PROVIDER_SITE_OTHER): Payer: Medicare Other

## 2023-01-13 ENCOUNTER — Ambulatory Visit (INDEPENDENT_AMBULATORY_CARE_PROVIDER_SITE_OTHER): Payer: Medicare Other | Admitting: Nurse Practitioner

## 2023-01-13 ENCOUNTER — Encounter (INDEPENDENT_AMBULATORY_CARE_PROVIDER_SITE_OTHER): Payer: Self-pay | Admitting: Nurse Practitioner

## 2023-01-13 VITALS — BP 138/69 | HR 56 | Resp 18 | Ht 64.0 in | Wt 113.0 lb

## 2023-01-13 DIAGNOSIS — E785 Hyperlipidemia, unspecified: Secondary | ICD-10-CM | POA: Diagnosis not present

## 2023-01-13 DIAGNOSIS — Z959 Presence of cardiac and vascular implant and graft, unspecified: Secondary | ICD-10-CM

## 2023-01-13 DIAGNOSIS — I701 Atherosclerosis of renal artery: Secondary | ICD-10-CM

## 2023-01-13 NOTE — Progress Notes (Signed)
Subjective:    Patient ID: Melody Stone, female    DOB: 12-13-1950, 72 y.o.   MRN: [id number] Chief Complaint  Patient presents with   Follow-up    f/u with renal -    The patient returns to the office for followup and review of the noninvasive studies regarding renal vascular hypertension and renal artery stenosis.  The patient had a left renal stent placed on 12/15/2022.  There has been improvement in the patient's blood pressure control.the patient attendant denies any major changes in their medications.  The patient denies headache or flushing.  No flank or unusual back pain.  Currently the patient is not able to participate in her care, the attendant concerns majority of questions.  There have been no significant changes to the patient's overall health care.   The patient denies amaurosis fugax or recent TIA symptoms. There are no recent neurological changes noted. There is no history of DVT, PE or superficial thrombophlebitis. No recent episodes of angina or shortness of breath documented.   Duplex ultrasound of the bilateral renal arteries reveals about 1 to 59% stenosis.  There is a cyst noted on the left kidney.  The new stent is patent.  Angiogram noted about an 80% stenosis of the right renal artery     Review of Systems  Unable to perform ROS: Dementia  Most      Objective:   Physical Exam Vitals reviewed.  HENT:     Head: Normocephalic.  Cardiovascular:     Rate and Rhythm: Normal rate.  Pulmonary:     Effort: Pulmonary effort is normal.  Skin:    General: Skin is warm and dry.  Neurological:     Mental Status: She is alert. She is disoriented.  Psychiatric:        Mood and Affect: Mood normal.        Behavior: Behavior normal.        Thought Content: Thought content normal.        Judgment: Judgment normal.    BP 138/69 (BP Location: Right Arm)   Pulse (!) 56   Resp 18   Ht 5\' 4"  (1.626 m)   Wt 113 lb (51.3 kg)   BMI 19.40 kg/m   Past Medical  History:  Diagnosis Date   Anemia    Anxiety    Aortic insufficiency    a. 10/2015 Echo: Nl LV fxn, mild to moderate AI; b. 02/2017 Echo: EF 60-65%, no rwma, mild AI.   Cataracts, bilateral    Chest pain    a. 10/2015 Ex MV [facility]): EF 69%, no ischemia; b. 01/2017 Lexiscan MV: EF 45%, no ischemia/infarct-->Low risk.   Dementia (HCC)    Dyspnea on exertion    Esophagitis    Hepatitis C    HLD (hyperlipidemia)    Hypertension    Intermittent explosive disorder    Mental retardation    Panic disorder    Reflux esophagitis    Renal failure     Social History   Socioeconomic History   Marital status: Single    Spouse name: Not on file   Number of children: Not on file   Years of education: Not on file   Highest education level: Not on file  Occupational History   Not on file  Tobacco Use   Smoking status: Never   Smokeless tobacco: Never  Vaping Use   Vaping Use: Never used  Substance and Sexual Activity   Alcohol use: No  Drug use: No   Sexual activity: Not on file  Other Topics Concern   Not on file  Social History Narrative   Patient lives in a group home.  She participates in activities regularly.  Her exercise tolerance has fallen off over the past year.  She is no longer routinely exercising/going for walks.   Social Determinants of Health   Financial Resource Strain: Not on file  Food Insecurity: Not on file  Transportation Needs: Not on file  Physical Activity: Not on file  Stress: Not on file  Social Connections: Not on file  Intimate Partner Violence: Not on file    Past Surgical History:  Procedure Laterality Date   ABDOMINAL HYSTERECTOMY     APPENDECTOMY     RENAL ANGIOGRAPHY N/A 12/15/2022   Procedure: RENAL ANGIOGRAPHY;  Surgeon: Annice Needy, MD;  Location: [facility]IVE CV [facility];  Service: Cardiovascular;  Laterality: N/A;    Family History  Problem Relation Age of Onset   Heart attack Mother        CABG in her 90's   Cancer Mother    CAD  Brother        30   Hypertension Brother        13    Allergies  Allergen Reactions   Other Other (See Comments)    Psychotropic drugs cause kidney failure. Psychotropic drug cause renal failure. Psychotropic drugs cause kidney failure.   Ciprofloxacin Rash    Per caregiver.       Latest Ref Rng & Units 11/01/2022    5:49 PM 07/14/2022    1:37 PM 12/31/2021    4:24 PM  CBC  WBC 4.0 - 10.5 K/uL 8.2  9.7  11.7   Hemoglobin 12.0 - 15.0 g/dL 16.1  09.6  9.9   Hematocrit 36.0 - 46.0 % 33.2  30.7  29.5   Platelets 150 - 400 K/uL 241  220  197       CMP     Component Value Date/Time   NA 137 11/01/2022 1749   K 3.9 11/01/2022 1749   CL 104 11/01/2022 1749   CO2 24 11/01/2022 1749   GLUCOSE 88 11/01/2022 1749   BUN 12 12/15/2022 1409   CREATININE 0.52 12/15/2022 1409   CALCIUM 10.0 11/01/2022 1749   PROT 7.1 11/01/2022 2020   ALBUMIN 3.9 11/01/2022 2020   AST 23 11/01/2022 2020   ALT 14 11/01/2022 2020   ALKPHOS 104 11/01/2022 2020   BILITOT 0.9 11/01/2022 2020   GFRNONAA >60 12/15/2022 1409   GFRAA >60 08/09/2019 0703     No results found.     Assessment & Plan:   1. Renal artery stenosis (HCC) Recommend:  BP today was acceptable Given patient's atherosclerosis and PAD optimal control of the patient's hypertension is important.  The patient's BP and noninvasive studies support the previous intervention is patent. No further intervention is indicated at this time.  Pending the patient's blood pressure control over the next few months we may consider intervention on the right renal artery  Therefore the patient  will continue the current medications, no changes at this time.  The primary medical service will continue aggressive antihypertensive therapy as per the AHA guidelines   2. Hyperlipidemia, unspecified hyperlipidemia type Continue statin as ordered and reviewed, no changes at this time   Current Outpatient Medications on File Prior to Visit   Medication Sig Dispense Refill   aspirin EC 81 MG tablet Take 1 tablet (81 mg  total) by mouth daily. Swallow whole. 150 tablet 2   atorvastatin (LIPITOR) 10 MG tablet Take 1 tablet (10 mg total) by mouth daily. 30 tablet 11   Brimonidine Tartrate (LUMIFY) 0.025 % SOLN Apply to eye.     chlorhexidine (PERIDEX) 0.12 % solution SMARTSIG:By Mouth     Cholecalciferol (VITAMIN D3) 1000 units CAPS Take 2,000 Units by mouth daily.      clonazePAM (KLONOPIN) 0.5 MG tablet Take 0.25-0.5 mg by mouth See admin instructions. Take  tablet (0.25mg ) by mouth every morning and 1 tablet (1mg ) by mouth every evening     clopidogrel (PLAVIX) 75 MG tablet Take 1 tablet (75 mg total) by mouth daily. 30 tablet 11   docusate sodium (COLACE) 100 MG capsule Take 200 mg by mouth daily.     donepezil (ARICEPT) 5 MG tablet Take 5 mg by mouth daily.     gentamicin (GARAMYCIN) 0.3 % ophthalmic solution Place 1 drop into the left eye 3 (three) times daily.     ibuprofen (ADVIL,MOTRIN) 200 MG tablet Take 600 mg by mouth 2 (two) times daily.     loratadine (CLARITIN) 10 MG tablet Take 10 mg by mouth daily.     memantine (NAMENDA) 10 MG tablet Take 10 mg by mouth 2 (two) times daily.     pantoprazole (PROTONIX) 40 MG tablet Take 1 tablet (40 mg total) by mouth daily for 14 days. 14 tablet 0   pantoprazole (PROTONIX) 40 MG tablet Take 40 mg by mouth daily.     polyethylene glycol (MIRALAX) 17 g packet Take 17 g by mouth daily. 14 each 0   SYSTANE BALANCE 0.6 % SOLN Apply 1 drop to eye 4 (four) times daily.     venlafaxine XR (EFFEXOR-XR) 37.5 MG 24 hr capsule Take 37.5 mg by mouth daily.     verapamil (CALAN-SR) 180 MG CR tablet Take 180 mg by mouth 2 (two) times daily.      Aloe Vera OIL by Does not apply route. Cream (Patient not taking: Reported on 01/13/2023)     B Complex-C-Folic Acid (B-COMPLEX/FOLIC ACID/VITAMIN C) TBCR Take 1 tablet by mouth daily. (Patient not taking: Reported on 11/29/2022)     losartan (COZAAR) 50 MG  tablet Take 1 tablet by mouth daily.     Olopatadine HCl 0.2 % SOLN Place 1 drop into both eyes daily. (Patient not taking: Reported on 01/13/2023)     [DISCONTINUED] PARoxetine (PAXIL) 30 MG tablet Take 30 mg by mouth at bedtime.      No current facility-administered medications on file prior to visit.    There are no Patient Instructions on file for this visit. No follow-ups on file.   Georgiana Spinner, NP

## 2023-01-25 ENCOUNTER — Encounter: Payer: Self-pay | Admitting: Emergency Medicine

## 2023-01-25 ENCOUNTER — Emergency Department: Payer: Medicare Other

## 2023-01-25 ENCOUNTER — Emergency Department
Admission: EM | Admit: 2023-01-25 | Discharge: 2023-01-25 | Disposition: A | Discharge status: Home / Self Care | Payer: Medicare Other | Attending: Emergency Medicine | Admitting: Emergency Medicine

## 2023-01-25 DIAGNOSIS — W1830XA Fall on same level, unspecified, initial encounter: Secondary | ICD-10-CM | POA: Diagnosis not present

## 2023-01-25 DIAGNOSIS — G309 Alzheimer's disease, unspecified: Secondary | ICD-10-CM | POA: Insufficient documentation

## 2023-01-25 DIAGNOSIS — S0083XA Contusion of other part of head, initial encounter: Secondary | ICD-10-CM | POA: Diagnosis not present

## 2023-01-25 DIAGNOSIS — S0990XA Unspecified injury of head, initial encounter: Secondary | ICD-10-CM | POA: Diagnosis present

## 2023-01-25 DIAGNOSIS — S80212A Abrasion, left knee, initial encounter: Secondary | ICD-10-CM | POA: Insufficient documentation

## 2023-01-25 NOTE — ED Provider Notes (Signed)
[facility] Provider Note    None    (approximate)   History   Fall   HPI  Melody Stone is a 72 y.o. female brought in by her caretaker with PMH of intellectual disability and Alzheimer's, who presents for evaluation after a mechanical fall earlier today.  Her caretaker states that they went outside for a fire drill and as the patient was walking back into the house she tripped on the curb and fell hitting her head and knee on the ground.  The caretaker does not think she lost consciousness.  Patient has not been complaining about any pain.  The patient has not had any complaints of headache, nausea, vomiting, sensitivity to light/sound and other concussive symptoms.  Her caretaker reports she has been acting like herself and has not had any changes from her baseline.     Physical Exam   Triage Vital Signs: ED Triage Vitals  Enc Vitals Group     BP 01/25/23 2038 (!) 168/61     Pulse Rate 01/25/23 2038 61     Resp 01/25/23 2038 20     Temp 01/25/23 2038 98.1 F (36.7 C)     Temp Source 01/25/23 2038 Oral     SpO2 01/25/23 2038 98 %     Weight 01/25/23 2041 114 lb 13.8 oz (52.1 kg)     Height 01/25/23 2041 5\' 4"  (1.626 m)     Head Circumference --      Peak Flow --      Pain Score --      Pain Loc --      Pain Edu? --      Excl. in GC? --     Most recent vital signs: Vitals:   01/25/23 2041 01/25/23 2230  BP: (!) 168/61 (!) 160/70  Pulse: 61 70  Resp: (!) 21 20  Temp: 98.1 F (36.7 C)   SpO2: 93% 94%   General: Awake, no distress.  CV:  Good peripheral perfusion.  RRR, no murmurs rubs or gallops. Resp:  Normal effort.  CTAB. Abd:  No distention.  Neuro:  Neuro exam limited due to patient's intellectual disability, no focal neuro deficits observed. Head:  Abrasion with underlying hematoma on the left forehead.  No bruising at this time.  No bleeding. Left knee: Abrasions to lateral left knee with Band-Aids over top.  No  bleeding.   ED Results / Procedures / Treatments   Labs (all labs ordered are listed, but only abnormal results are displayed) Labs Reviewed - No data to display    RADIOLOGY  CT head, cervical spine, maxillofacial without contrast obtained in the ED today.  I independently interpreted the images as well as reviewed the radiologist report.  IMPRESSION:  CT of the head: Chronic atrophic and ischemic changes without acute  intracranial abnormality.    Left forehead hematoma.    CT of the maxillofacial bones: No acute bony abnormality is noted.    CT of the cervical spine: Multilevel degenerative change without  acute abnormality.    PROCEDURES:  Critical Care performed: No  Procedures   MEDICATIONS ORDERED IN ED: Medications - No data to display   IMPRESSION / MDM / ASSESSMENT AND PLAN / ED COURSE  I reviewed the triage vital signs and the nursing notes.                              Differential  diagnosis includes, but is not limited to, subdural hematoma, epidural hematoma, concussion, head contusion, head laceration, skull fracture.  Patient's presentation is most consistent with acute, uncomplicated illness.  Patient was brought in by her caretaker after mechanical fall earlier today.  CT scans of the head, cervical spine and maxillofacial were ordered from triage.  I independently interpreted the CT scans and reviewed the radiologist report.  Images show a left forehead hematoma, chronic atrophic and ischemic changes without intracranial abnormality, mild degenerative changes but are negative for fracture and cranial bleed.  Neuroexam was completed and I did not identify any focal neurodeficits, however it was limited due to patient's intellectual disability.  Her caretaker reports that she has not had any change from her baseline. Given that patient's imaging was negative and caretaker report was reassuring I believe patient's is appropriate for outpatient  management.  I cleaned her forehead abrasion with saline and gauze and applied 2 bandages.  Caretaker was given return to ED precautions.  I spoke with her legal guardian to update him on her condition before discharge.  Both caretaker and guardian were agreeable to plan, all questions were answered, patient stable for discharge.   FINAL CLINICAL IMPRESSION(S) / ED DIAGNOSES   Final diagnoses:  Injury of head, initial encounter     Rx / DC Orders   ED Discharge Orders     None        Note:  This document was prepared using Dragon voice recognition software and may include unintentional dictation errors.   Cameron Ali, PA-C 01/25/23 2235    Phineas Semen, MD 01/25/23 2255

## 2023-01-25 NOTE — ED Triage Notes (Addendum)
Pt presents  in a wheelchair to triage via POV with complaints of fall tonight. Pt had witnessed fall hitting her head on concrete - abrasion to L forehead with a forming hematoma. Unsure if pt had LOC, hx of dementia pt is denying any other pain at this time.

## 2023-01-25 NOTE — ED Notes (Signed)
Pt's caregiver verbalizes understanding of discharge instructions. Opportunity for questioning and answers were provided. Pt discharged from ED to home.  

## 2023-01-25 NOTE — ED Notes (Signed)
Called Melody Stone and informed of discharge.

## 2023-01-26 ENCOUNTER — Encounter: Payer: Self-pay | Admitting: Gastroenterology

## 2023-01-26 ENCOUNTER — Other Ambulatory Visit: Payer: Self-pay

## 2023-01-26 ENCOUNTER — Ambulatory Visit (INDEPENDENT_AMBULATORY_CARE_PROVIDER_SITE_OTHER): Payer: Medicare Other | Admitting: Gastroenterology

## 2023-01-26 VITALS — BP 132/84 | HR 78 | Temp 97.9°F

## 2023-01-26 DIAGNOSIS — R634 Abnormal weight loss: Secondary | ICD-10-CM

## 2023-01-26 NOTE — Patient Instructions (Signed)
Please read the attached information for your procedure.

## 2023-01-26 NOTE — Progress Notes (Signed)
Wyline Mood MD, MRCP(U.K) [address]  [address]  [place], [place] [address]  Main: [phone]  Fax: [phone]   Gastroenterology Consultation  Referring Provider:     Danella Penton, MD Primary Care Physician:  Danella Penton, MD Primary Gastroenterologist:  Dr. Wyline Mood  Reason for Consultation: Reflux         HPI:   Melody Stone is a 72 y.o. y/o female referred for consultation & management  by Dr. Hyacinth Meeker, Hardin Negus, MD.    The patient is here today with her caregiver unable to give any history she says the reason to see me today is for unintentional weight loss of 30 to 40 pounds over the past few months.  Patient is on Plavix on Aricept as well. She was seen at the ER on 11/01/2022 for chest pain. Ct chest showed , mild circumferential wall thickening suggestive of esophagitis. Commenced on Protonix 40 mg BID.  Past Medical History:  Diagnosis Date   Anemia    Anxiety    Aortic insufficiency    a. 10/2015 Echo: Nl LV fxn, mild to moderate AI; b. 02/2017 Echo: EF 60-65%, no rwma, mild AI.   Cataracts, bilateral    Chest pain    a. 10/2015 Ex MV Gavin Potters): EF 69%, no ischemia; b. 01/2017 Lexiscan MV: EF 45%, no ischemia/infarct-->Low risk.   Dementia (HCC)    Dyspnea on exertion    Esophagitis    Hepatitis C    HLD (hyperlipidemia)    Hypertension    Intermittent explosive disorder    Mental retardation    Panic disorder    Reflux esophagitis    Renal failure     Past Surgical History:  Procedure Laterality Date   ABDOMINAL HYSTERECTOMY     APPENDECTOMY     RENAL ANGIOGRAPHY N/A 12/15/2022   Procedure: RENAL ANGIOGRAPHY;  Surgeon: Annice Needy, MD;  Location: [facility];  Service: Cardiovascular;  Laterality: N/A;    Prior to Admission medications   Medication Sig Start Date End Date Taking? Authorizing Provider  Aloe Vera OIL by Does not apply route. Cream Patient not taking: Reported on 01/13/2023    [provider]  aspirin EC  81 MG tablet Take 1 tablet (81 mg total) by mouth daily. Swallow whole. 12/15/22 12/15/23  Annice Needy, MD  atorvastatin (LIPITOR) 10 MG tablet Take 1 tablet (10 mg total) by mouth daily. 12/15/22 12/15/23  Annice Needy, MD  B Complex-C-Folic Acid (B-COMPLEX/FOLIC ACID/VITAMIN C) TBCR Take 1 tablet by mouth daily. Patient not taking: Reported on 11/29/2022 07/21/21   [provider]  Brimonidine Tartrate (LUMIFY) 0.025 % SOLN Apply to eye.    [provider]  chlorhexidine (PERIDEX) 0.12 % solution SMARTSIG:By Mouth 12/02/21   [provider]  Cholecalciferol (VITAMIN D3) 1000 units CAPS Take 2,000 Units by mouth daily.     [provider]  clonazePAM (KLONOPIN) 0.5 MG tablet Take 0.25-0.5 mg by mouth See admin instructions. Take  tablet (0.25mg ) by mouth every morning and 1 tablet (1mg ) by mouth every evening    [provider]  clopidogrel (PLAVIX) 75 MG tablet Take 1 tablet (75 mg total) by mouth daily. 12/15/22   Annice Needy, MD  docusate sodium (COLACE) 100 MG capsule Take 200 mg by mouth daily.    [provider]  donepezil (ARICEPT) 5 MG tablet Take 5 mg by mouth daily. 02/28/20   [provider]  gentamicin (GARAMYCIN) 0.3 % ophthalmic solution Place 1 drop into the left eye 3 (three) times daily. 01/02/23   [provider]  ibuprofen (ADVIL,MOTRIN) 200 MG tablet Take 600 mg by mouth 2 (two) times daily.    [provider]  loratadine (CLARITIN) 10 MG tablet Take 10 mg by mouth daily.    [provider]  losartan (COZAAR) 50 MG tablet Take 1 tablet by mouth daily. 12/20/20   [provider]  memantine (NAMENDA) 10 MG tablet Take 10 mg by mouth 2 (two) times daily. 02/21/20   [provider]  Olopatadine HCl 0.2 % SOLN Place 1 drop into both eyes daily. Patient not taking: Reported on 01/13/2023    [provider]  pantoprazole (PROTONIX) 40 MG tablet Take 1 tablet (40 mg total) by mouth  daily for 14 days. 11/01/22   Concha Se, MD  pantoprazole (PROTONIX) 40 MG tablet Take 40 mg by mouth daily.    [provider]  polyethylene glycol (MIRALAX) 17 g packet Take 17 g by mouth daily. 02/04/21   Lucy Chris, PA  SYSTANE BALANCE 0.6 % SOLN Apply 1 drop to eye 4 (four) times daily. 11/24/21   [provider]  venlafaxine XR (EFFEXOR-XR) 37.5 MG 24 hr capsule Take 37.5 mg by mouth daily. 02/21/20   [provider]  verapamil (CALAN-SR) 180 MG CR tablet Take 180 mg by mouth 2 (two) times daily.     [provider]  PARoxetine (PAXIL) 30 MG tablet Take 30 mg by mouth at bedtime.   04/14/20  [provider]    Family History  Problem Relation Age of Onset   Heart attack Mother        CABG in her 38's   Cancer Mother    CAD Brother        64   Hypertension Brother        53     Social History   Tobacco Use   Smoking status: Never   Smokeless tobacco: Never  Vaping Use   Vaping Use: Never used  Substance Use Topics   Alcohol use: No   Drug use: No    Allergies as of 01/26/2023 - Review Complete 01/26/2023  Allergen Reaction Noted   Other Other (See Comments) 10/09/2015   Ciprofloxacin Rash 05/27/2020    Review of Systems:    Unable to give any history   Physical Exam:  BP 132/84   Pulse 78   Temp 97.9 F (36.6 C) (Oral)  No LMP recorded. Patient has had a hysterectomy. Psych: Awake but unable to provide any history General: Thin and cachectic Head:  Normocephalic and atraumatic. Eyes:  Sclera clear, no icterus.   Conjunctiva pink. Neurologic:  Alert and oriented x 0 Psych: Awake and composed  Imaging Studies: CT HEAD WO CONTRAST ( )  Result Date: 01/25/2023 CLINICAL DATA:  Recent fall with head injury and forehead hematoma, initial encounter EXAM: CT HEAD WITHOUT CONTRAST CT MAXILLOFACIAL WITHOUT CONTRAST CT CERVICAL SPINE WITHOUT CONTRAST TECHNIQUE: Multidetector CT imaging of the head, cervical spine,  and maxillofacial structures were performed using the standard protocol without intravenous contrast. Multiplanar CT image reconstructions of the cervical spine and maxillofacial structures were also generated. RADIATION DOSE REDUCTION: This exam was performed according to the departmental dose-optimization program which includes automated exposure control, adjustment of the mA and/or kV according to patient size and/or use of iterative reconstruction technique. COMPARISON:  11/01/2022 FINDINGS: CT HEAD FINDINGS Brain: No evidence  of acute infarction, hemorrhage, hydrocephalus, extra-axial collection or mass lesion/mass effect. Chronic atrophic and white matter ischemic changes are noted. Vascular: No hyperdense vessel or unexpected calcification. Skull: Normal. Negative for fracture or focal lesion. Other: Left forehead hematoma is noted consistent with the given clinical history. CT MAXILLOFACIAL FINDINGS Osseous: Degenerative changes of the temporomandibular joints are seen. No acute fracture or dislocation is noted. Orbits: Orbits and their contents are within normal limits. Sinuses: Paranasal sinuses are unremarkable. Soft tissues: Surrounding soft tissue structures show no focal hematoma. CT CERVICAL SPINE FINDINGS Alignment: Reversal of the normal cervical lordosis. Skull base and vertebrae: 7 cervical segments are well visualized. Vertebral body height is well maintained. Multilevel osteophytic change and facet hypertrophic changes are noted. No acute fracture or acute facet abnormality is seen. Soft tissues and spinal canal: Surrounding soft tissue structures are within normal limits. Upper chest: Visualized lung apices are unremarkable. Other: None IMPRESSION: CT of the head: Chronic atrophic and ischemic changes without acute intracranial abnormality. Left forehead hematoma. CT of the maxillofacial bones: No acute bony abnormality is noted. CT of the cervical spine: Multilevel degenerative change without  acute abnormality. Electronically Signed   By: Alcide Clever M.D.   On: 01/25/2023 21:31   CT Cervical Spine Wo Contrast  Result Date: 01/25/2023 CLINICAL DATA:  Recent fall with head injury and forehead hematoma, initial encounter EXAM: CT HEAD WITHOUT CONTRAST CT MAXILLOFACIAL WITHOUT CONTRAST CT CERVICAL SPINE WITHOUT CONTRAST TECHNIQUE: Multidetector CT imaging of the head, cervical spine, and maxillofacial structures were performed using the standard protocol without intravenous contrast. Multiplanar CT image reconstructions of the cervical spine and maxillofacial structures were also generated. RADIATION DOSE REDUCTION: This exam was performed according to the departmental dose-optimization program which includes automated exposure control, adjustment of the mA and/or kV according to patient size and/or use of iterative reconstruction technique. COMPARISON:  11/01/2022 FINDINGS: CT HEAD FINDINGS Brain: No evidence of acute infarction, hemorrhage, hydrocephalus, extra-axial collection or mass lesion/mass effect. Chronic atrophic and white matter ischemic changes are noted. Vascular: No hyperdense vessel or unexpected calcification. Skull: Normal. Negative for fracture or focal lesion. Other: Left forehead hematoma is noted consistent with the given clinical history. CT MAXILLOFACIAL FINDINGS Osseous: Degenerative changes of the temporomandibular joints are seen. No acute fracture or dislocation is noted. Orbits: Orbits and their contents are within normal limits. Sinuses: Paranasal sinuses are unremarkable. Soft tissues: Surrounding soft tissue structures show no focal hematoma. CT CERVICAL SPINE FINDINGS Alignment: Reversal of the normal cervical lordosis. Skull base and vertebrae: 7 cervical segments are well visualized. Vertebral body height is well maintained. Multilevel osteophytic change and facet hypertrophic changes are noted. No acute fracture or acute facet abnormality is seen. Soft tissues and  spinal canal: Surrounding soft tissue structures are within normal limits. Upper chest: Visualized lung apices are unremarkable. Other: None IMPRESSION: CT of the head: Chronic atrophic and ischemic changes without acute intracranial abnormality. Left forehead hematoma. CT of the maxillofacial bones: No acute bony abnormality is noted. CT of the cervical spine: Multilevel degenerative change without acute abnormality. Electronically Signed   By: Alcide Clever M.D.   On: 01/25/2023 21:31   CT Maxillofacial Wo Contrast  Result Date: 01/25/2023 CLINICAL DATA:  Recent fall with head injury and forehead hematoma, initial encounter EXAM: CT HEAD WITHOUT CONTRAST CT MAXILLOFACIAL WITHOUT CONTRAST CT CERVICAL SPINE WITHOUT CONTRAST TECHNIQUE: Multidetector CT imaging of the head, cervical spine, and maxillofacial structures were performed using the standard protocol without intravenous contrast. Multiplanar CT image  reconstructions of the cervical spine and maxillofacial structures were also generated. RADIATION DOSE REDUCTION: This exam was performed according to the departmental dose-optimization program which includes automated exposure control, adjustment of the mA and/or kV according to patient size and/or use of iterative reconstruction technique. COMPARISON:  11/01/2022 FINDINGS: CT HEAD FINDINGS Brain: No evidence of acute infarction, hemorrhage, hydrocephalus, extra-axial collection or mass lesion/mass effect. Chronic atrophic and white matter ischemic changes are noted. Vascular: No hyperdense vessel or unexpected calcification. Skull: Normal. Negative for fracture or focal lesion. Other: Left forehead hematoma is noted consistent with the given clinical history. CT MAXILLOFACIAL FINDINGS Osseous: Degenerative changes of the temporomandibular joints are seen. No acute fracture or dislocation is noted. Orbits: Orbits and their contents are within normal limits. Sinuses: Paranasal sinuses are unremarkable. Soft  tissues: Surrounding soft tissue structures show no focal hematoma. CT CERVICAL SPINE FINDINGS Alignment: Reversal of the normal cervical lordosis. Skull base and vertebrae: 7 cervical segments are well visualized. Vertebral body height is well maintained. Multilevel osteophytic change and facet hypertrophic changes are noted. No acute fracture or acute facet abnormality is seen. Soft tissues and spinal canal: Surrounding soft tissue structures are within normal limits. Upper chest: Visualized lung apices are unremarkable. Other: None IMPRESSION: CT of the head: Chronic atrophic and ischemic changes without acute intracranial abnormality. Left forehead hematoma. CT of the maxillofacial bones: No acute bony abnormality is noted. CT of the cervical spine: Multilevel degenerative change without acute abnormality. Electronically Signed   By: Alcide Clever M.D.   On: 01/25/2023 21:31   VAS US RENAL ARTERY DUPLEX  Result Date: 01/16/2023 ABDOMINAL VISCERAL Patient Name:  Melody Stone  Date of Exam:   01/13/2023 Medical Rec #: [id number]        Accession #:    [id number] Date of Birth: [birth year]        Patient Gender: F Patient Age:   21 years Exam Location:  [facility] Procedure:      VAS US RENAL ARTERY DUPLEX Referring Phys: [id number] Marlow Baars DEW -------------------------------------------------------------------------------- Indications: lt ras Vascular Interventions: 11/2022 Balloon expandable stent placement to the left                         renal artery with a pair of 6 mm diameter x 16 strength                         mm length stents. Performing Technologist: Salvadore Farber RVT  Examination Guidelines: A complete evaluation includes B-mode imaging, spectral Doppler, color Doppler, and power Doppler as needed of all accessible portions of each vessel. Bilateral testing is considered an integral part of a complete examination. Limited examinations for reoccurring indications may be performed as noted.   Duplex Findings: +----------+--------+--------+------+--------+ MesentericPSV cm/sEDV cm/sPlaqueComments +----------+--------+--------+------+--------+ Aorta Mid    71                          +----------+--------+--------+------+--------+    +------------------+--------+--------+-------+ Right Renal ArteryPSV cm/sEDV cm/sComment +------------------+--------+--------+-------+ Proximal            114                   +------------------+--------+--------+-------+ Mid                 133                   +------------------+--------+--------+-------+  Distal               97                   +------------------+--------+--------+-------+ +-----------------+--------+--------+-------+ Left Renal ArteryPSV cm/sEDV cm/sComment +-----------------+--------+--------+-------+ Proximal           151                   +-----------------+--------+--------+-------+ Mid                121                   +-----------------+--------+--------+-------+ Distal              85                   +-----------------+--------+--------+-------+ +------------+--------+--------+----+-----------+--------+--------+----+ Right KidneyPSV cm/sEDV cm/sRI  Left KidneyPSV cm/sEDV cm/sRI   +------------+--------+--------+----+-----------+--------+--------+----+ Upper Pole                      Upper Pole                      +------------+--------+--------+----+-----------+--------+--------+----+ Mid         39      12      0.        51      15      0.71 +------------+--------+--------+----+-----------+--------+--------+----+ Lower Pole                      Lower Pole                      +------------+--------+--------+----+-----------+--------+--------+----+ Hilar                           Hilar                           +------------+--------+--------+----+-----------+--------+--------+----+ +------------------+----+------------------+----+  Right Kidney          Left Kidney            +------------------+----+------------------+----+ RAR                   RAR                    +------------------+----+------------------+----+ RAR (manual)      1.87RAR (manual)      2.12 +------------------+----+------------------+----+ Cortex                Cortex                 +------------------+----+------------------+----+ Cortex thickness      Corex thickness        +------------------+----+------------------+----+ Kidney length (cm)9.32Kidney length (cm)9.96 +------------------+----+------------------+----+  Summary: Renal:  Right: Normal size right kidney. Normal right Resisitive Index.        Normal cortical thickness of right kidney. 1-59% stenosis of        the right renal artery. RRV flow present. Left:  Normal size of left kidney. Normal left Resistive Index.        Normal cortical thickness of the left kidney. 1-59% stenosis        of the left renal artery. LRV flow present. Cyst(s) noted.        2.68cm cyst upper pole Evidence of patent new stent with <60%  stenosis by diagnostic criteria.  *See table(s) above for measurements and observations.  Diagnosing physician: Festus Barren MD  Electronically signed by Festus Barren MD on 01/16/2023 at 10:44:32 AM.    Final     Assessment and Plan:   Melody Stone is a 72 y.o. y/o female is here to see me for esophagitis noted on CAT scan when she presents emergency room with chest discomfort.  Presently has no chest discomfort on Protonix twice daily, caregiver says that she lost 30 to 40 pounds recently no real symptoms to localize any particular reason for weight loss could be related to underlying dementia offered upper endoscopy to rule out any abnormalities in his esophagus since esophagitis was seen on CAT scan but could also be underlying malignancy.  I do not believe she can go through a bowel prep for colonoscopy hence we will proceed only with an upper endoscopy.  We  will obtain Plavix holding instructions as well as consenting for the guardian  I have discussed alternative options, risks & benefits,  which include, but are not limited to, bleeding, infection, perforation,respiratory complication & drug reaction.  The patients caregiver agrees with this plan & written consent will be obtained.     Follow up in as needed  Dr Wyline Mood MD,MRCP(U.K)

## 2023-02-02 ENCOUNTER — Telehealth: Payer: Self-pay

## 2023-02-02 ENCOUNTER — Telehealth (INDEPENDENT_AMBULATORY_CARE_PROVIDER_SITE_OTHER): Payer: Self-pay

## 2023-02-02 NOTE — Telephone Encounter (Signed)
Called Dr. Driscilla Grammes office and left a voicemail on the nurse's voicemail to call me back and let me know how many days can the patient hold her Plavix and when to restart. I left them my phone number to call me back and give the information for I could then call the patient's caregiver.

## 2023-02-03 NOTE — Telephone Encounter (Signed)
Yes that is fine

## 2023-02-03 NOTE — Telephone Encounter (Signed)
She can hold it today

## 2023-02-03 NOTE — Telephone Encounter (Signed)
I let Kandis Cocking know that the pt can start Plavix back on the day after her procedure.

## 2023-02-03 NOTE — Telephone Encounter (Signed)
Spoke with Melody Stone and she states understanding of starting to hold plavix today.  Can pt start back on the plavix on the day following surgery? Please advise

## 2023-02-06 ENCOUNTER — Encounter: Payer: Self-pay | Admitting: Gastroenterology

## 2023-02-07 ENCOUNTER — Ambulatory Visit
Admission: RE | Admit: 2023-02-07 | Discharge: 2023-02-07 | Disposition: A | Discharge status: Home / Self Care | Payer: Medicare Other | Attending: Gastroenterology | Admitting: Gastroenterology

## 2023-02-07 ENCOUNTER — Ambulatory Visit: Payer: Medicare Other | Admitting: Registered Nurse

## 2023-02-07 ENCOUNTER — Encounter
Admission: RE | Disposition: A | Discharge status: Home / Self Care | Payer: Self-pay | Source: Home / Self Care | Attending: Gastroenterology

## 2023-02-07 DIAGNOSIS — R634 Abnormal weight loss: Secondary | ICD-10-CM

## 2023-02-07 DIAGNOSIS — F79 Unspecified intellectual disabilities: Secondary | ICD-10-CM | POA: Diagnosis not present

## 2023-02-07 DIAGNOSIS — I10 Essential (primary) hypertension: Secondary | ICD-10-CM | POA: Diagnosis not present

## 2023-02-07 DIAGNOSIS — F0390 Unspecified dementia without behavioral disturbance: Secondary | ICD-10-CM | POA: Insufficient documentation

## 2023-02-07 DIAGNOSIS — D649 Anemia, unspecified: Secondary | ICD-10-CM | POA: Insufficient documentation

## 2023-02-07 DIAGNOSIS — F419 Anxiety disorder, unspecified: Secondary | ICD-10-CM | POA: Insufficient documentation

## 2023-02-07 DIAGNOSIS — F32A Depression, unspecified: Secondary | ICD-10-CM | POA: Insufficient documentation

## 2023-02-07 DIAGNOSIS — Z6820 Body mass index (BMI) 20.0-20.9, adult: Secondary | ICD-10-CM | POA: Insufficient documentation

## 2023-02-07 DIAGNOSIS — E785 Hyperlipidemia, unspecified: Secondary | ICD-10-CM | POA: Diagnosis not present

## 2023-02-07 HISTORY — PX: ESOPHAGOGASTRODUODENOSCOPY (EGD) WITH PROPOFOL: SHX5813

## 2023-02-07 SURGERY — ESOPHAGOGASTRODUODENOSCOPY (EGD) WITH PROPOFOL
Anesthesia: General

## 2023-02-07 MED ORDER — PROPOFOL 500 MG/50ML IV EMUL
INTRAVENOUS | Status: DC | PRN
  Administered 2023-02-07: 100 ug/kg/min via INTRAVENOUS

## 2023-02-07 MED ORDER — SODIUM CHLORIDE 0.9 % IV SOLN: INTRAVENOUS | Status: DC

## 2023-02-07 MED ORDER — GLYCOPYRROLATE 0.2 MG/ML IJ SOLN
INTRAMUSCULAR | Status: DC | PRN
  Administered 2023-02-07: .2 mg via INTRAVENOUS

## 2023-02-07 MED ORDER — PROPOFOL 10 MG/ML IV BOLUS
INTRAVENOUS | Status: DC | PRN
  Administered 2023-02-07: 60 mg via INTRAVENOUS

## 2023-02-07 MED ORDER — LIDOCAINE HCL (CARDIAC) PF 100 MG/5ML IV SOSY
PREFILLED_SYRINGE | INTRAVENOUS | Status: DC | PRN
  Administered 2023-02-07: 40 mg via INTRAVENOUS

## 2023-02-07 NOTE — Anesthesia Preprocedure Evaluation (Addendum)
Anesthesia Evaluation  Patient identified by MRN, date of birth, ID band Patient awake    Reviewed: Allergy & Precautions, NPO status , Patient's Chart, lab work & pertinent test results  History of Anesthesia Complications Negative for: history of anesthetic complications  Airway Mallampati: Unable to assess  TM Distance: >3 FB Neck ROM: full    Dental  (+) Dental Advidsory Given   Pulmonary neg pulmonary ROS   Pulmonary exam normal        Cardiovascular hypertension, On Medications Normal cardiovascular exam     Neuro/Psych  PSYCHIATRIC DISORDERS Anxiety    Dementia negative neurological ROS     GI/Hepatic negative GI ROS,,,  Endo/Other  negative endocrine ROS    Renal/GU Renal disease  negative genitourinary   Musculoskeletal   Abdominal   Peds  Hematology  (+) Blood dyscrasia, anemia   Anesthesia Other Findings Past Medical History: No date: Anemia No date: Anxiety No date: Aortic insufficiency     Comment:  a. 10/2015 Echo: Nl LV fxn, mild to moderate AI; b.               02/2017 Echo: EF 60-65%, no rwma, mild AI. No date: Cataracts, bilateral No date: Chest pain     Comment:  a. 10/2015 Ex MV Gavin Potters): EF 69%, no ischemia; b.               01/2017 Lexiscan MV: EF 45%, no ischemia/infarct-->Low               risk. No date: Dementia (HCC) No date: Dyspnea on exertion No date: Esophagitis No date: Hepatitis C No date: HLD (hyperlipidemia) No date: Hypertension No date: Intermittent explosive disorder No date: Mental retardation No date: Panic disorder No date: Reflux esophagitis No date: Renal failure  Past Surgical History: No date: ABDOMINAL HYSTERECTOMY No date: APPENDECTOMY 12/15/2022: RENAL ANGIOGRAPHY; N/A     Comment:  Procedure: RENAL ANGIOGRAPHY;  Surgeon: Annice Needy,               MD;  Location: [facility] IN[facility];  Service:               Cardiovascular;  Laterality: N/A;  BMI     Body Mass Index: 20.08 kg/m      Reproductive/Obstetrics negative OB ROS                             Anesthesia Physical Anesthesia Plan  ASA: 3  Anesthesia Plan: General   Post-op Pain Management: Minimal or no pain anticipated   Induction: Intravenous  PONV Risk Score and Plan: 3 and Propofol infusion, TIVA and Ondansetron  Airway Management Planned: Nasal Cannula  Additional Equipment: None  Intra-op Plan:   Post-operative Plan:   Informed Consent: I have reviewed the patients History and Physical, chart, labs and discussed the procedure including the risks, benefits and alternatives for the proposed anesthesia with the patient or authorized representative who has indicated his/her understanding and acceptance.     Dental advisory given  Plan Discussed with: CRNA and Surgeon  Anesthesia Plan Comments: (Discussed risks of anesthesia with patient's brother, including possibility of difficulty with spontaneous ventilation under anesthesia necessitating airway intervention, PONV, and rare risks such as cardiac or respiratory or neurological events, and allergic reactions. Discussed the role of CRNA in patient's perioperative care. Patient's brother understands.)       Anesthesia Quick Evaluation

## 2023-02-07 NOTE — H&P (Signed)
Wyline Mood, MD [address], [place], [place], [address] [address], [address], [place], [place], [address] Phone: [phone]  Fax: [phone]  Primary Care Physician:  Danella Penton, MD   Pre-Procedure History & Physical: HPI:  Melody Stone is a 72 y.o. female is here for an endoscopy    Past Medical History:  Diagnosis Date   Anemia    Anxiety    Aortic insufficiency    a. 10/2015 Echo: Nl LV fxn, mild to moderate AI; b. 02/2017 Echo: EF 60-65%, no rwma, mild AI.   Cataracts, bilateral    Chest pain    a. 10/2015 Ex MV Gavin Potters): EF 69%, no ischemia; b. 01/2017 Lexiscan MV: EF 45%, no ischemia/infarct-->Low risk.   Dementia (HCC)    Dyspnea on exertion    Esophagitis    Hepatitis C    HLD (hyperlipidemia)    Hypertension    Intermittent explosive disorder    Mental retardation    Panic disorder    Reflux esophagitis    Renal failure     Past Surgical History:  Procedure Laterality Date   ABDOMINAL HYSTERECTOMY     APPENDECTOMY     RENAL ANGIOGRAPHY N/A 12/15/2022   Procedure: RENAL ANGIOGRAPHY;  Surgeon: Annice Needy, MD;  Location: [facility] INVASIVE CV LAB;  Service: Cardiovascular;  Laterality: N/A;    Prior to Admission medications   Medication Sig Start Date End Date Taking? Authorizing Provider  Aloe Vera OIL by Does not apply route. Cream    [provider]  aspirin EC 81 MG tablet Take 1 tablet (81 mg total) by mouth daily. Swallow whole. 12/15/22 12/15/23  Annice Needy, MD  atorvastatin (LIPITOR) 10 MG tablet Take 1 tablet (10 mg total) by mouth daily. 12/15/22 12/15/23  Annice Needy, MD  B Complex-C-Folic Acid (B-COMPLEX/FOLIC ACID/VITAMIN C) TBCR Take 1 tablet by mouth daily. Patient not taking: Reported on 01/26/2023 07/21/21   [provider]  Brimonidine Tartrate (LUMIFY) 0.025 % SOLN Apply to eye.    [provider]  chlorhexidine (PERIDEX) 0.12 % solution SMARTSIG:By Mouth 12/02/21   [provider]   Cholecalciferol (VITAMIN D3) 1000 units CAPS Take 2,000 Units by mouth daily.     [provider]  clonazePAM (KLONOPIN) 0.5 MG tablet Take 0.25-0.5 mg by mouth See admin instructions. Take  tablet (0.25mg ) by mouth every morning and 1 tablet (1mg ) by mouth every evening    [provider]  clopidogrel (PLAVIX) 75 MG tablet Take 1 tablet (75 mg total) by mouth daily. 12/15/22   Annice Needy, MD  docusate sodium (COLACE) 100 MG capsule Take 200 mg by mouth daily.    [provider]  donepezil (ARICEPT) 5 MG tablet Take 5 mg by mouth daily. 02/28/20   [provider]  gentamicin (GARAMYCIN) 0.3 % ophthalmic solution Place 1 drop into the left eye 3 (three) times daily. 01/02/23   [provider]  ibuprofen (ADVIL,MOTRIN) 200 MG tablet Take 600 mg by mouth 2 (two) times daily. Patient not taking: Reported on 01/26/2023    [provider]  loratadine (CLARITIN) 10 MG tablet Take 10 mg by mouth daily.    [provider]  losartan (COZAAR) 50 MG tablet Take 1 tablet by mouth daily. Patient not taking: Reported on 01/26/2023 12/20/20   [provider]  memantine (NAMENDA) 10 MG tablet Take 10 mg by mouth 2 (two) times daily. 02/21/20   [provider]  Olopatadine HCl 0.2 % SOLN Place 1 drop into both eyes daily.    [provider]  pantoprazole (PROTONIX) 40 MG tablet Take 1 tablet (40 mg total) by mouth daily for 14 days. 11/01/22   Concha Se, MD  polyethylene glycol (MIRALAX) 17 g packet Take 17 g by mouth daily. 02/04/21   Lucy Chris, PA  SYSTANE BALANCE 0.6 % SOLN Apply 1 drop to eye 4 (four) times daily. 11/24/21   [provider]  venlafaxine XR (EFFEXOR-XR) 37.5 MG 24 hr capsule Take 37.5 mg by mouth daily. 02/21/20   [provider]  verapamil (CALAN-SR) 180 MG CR tablet Take 180 mg by mouth 2 (two) times daily.     [provider]  PARoxetine (PAXIL) 30 MG tablet Take 30 mg by  mouth at bedtime.   04/14/20  [provider]    Allergies as of 01/27/2023 - Review Complete 01/26/2023  Allergen Reaction Noted   Other Other (See Comments) 10/09/2015   Ciprofloxacin Rash 05/27/2020    Family History  Problem Relation Age of Onset   Heart attack Mother        CABG in her 47's   Cancer Mother    CAD Brother        63   Hypertension Brother        11    Social History   Socioeconomic History   Marital status: Single    Spouse name: Not on file   Number of children: Not on file   Years of education: Not on file   Highest education level: Not on file  Occupational History   Not on file  Tobacco Use   Smoking status: Never   Smokeless tobacco: Never  Vaping Use   Vaping Use: Never used  Substance and Sexual Activity   Alcohol use: No   Drug use: No   Sexual activity: Not on file  Other Topics Concern   Not on file  Social History Narrative   Patient lives in a group home.  She participates in activities regularly.  Her exercise tolerance has fallen off over the past year.  She is no longer routinely exercising/going for walks.   Social Determinants of Health   Financial Resource Strain: Not on file  Food Insecurity: Not on file  Transportation Needs: Not on file  Physical Activity: Not on file  Stress: Not on file  Social Connections: Not on file  Intimate Partner Violence: Not on file    Review of Systems: See HPI, otherwise negative ROS  Physical Exam: BP (!) 180/69   Pulse (!) 58   Temp (!) 97.2 F (36.2 C) (Temporal)   Resp 16   Ht 5\' 4"  (1.626 m)   Wt 53.1 kg   SpO2 100%   BMI 20.08 kg/m  General:   Alert,  pleasant and cooperative in NAD Head:  Normocephalic and atraumatic. Neck:  Supple; no masses or thyromegaly. Lungs:  Clear throughout to auscultation, normal respiratory effort.    Heart:  +S1, +S2, Regular rate and rhythm, No edema. Abdomen:  Soft, nontender and nondistended. Normal bowel sounds, without  guarding, and without rebound.   Neurologic:  Alert and  oriented x0   Impression/Plan: Melody Stone is here for an endoscopy  to be performed for  evaluation of abnormal ct scan     Risks, benefits, limitations, and alternatives regarding endoscopy have been reviewed with the patients brother .  Questions have been  answered.  All parties agreeable.   Wyline Mood, MD  02/07/2023, 10:59 AM

## 2023-02-07 NOTE — Op Note (Signed)
[facility] Patient Name: Melody Stone Procedure Date: 02/07/2023 10:55 AM MRN: [id number] Account #: [id number] Date of Birth: October 11, 1950 Admit Type: Outpatient Age: 72 Room: [facility] ENDO ROOM 2 Gender: Female Note Status: Finalized Instrument Name: Upper Endoscope [id number] Procedure:             Upper GI endoscopy Indications:           Weight loss Providers:             Wyline Mood MD, MD Referring MD:          Danella Penton, MD (Referring MD) Medicines:             Monitored Anesthesia Care Complications:         No immediate complications. Procedure:             Pre-Anesthesia Assessment:                        - Prior to the procedure, a History and Physical was                         performed, and patient medications, allergies and                         sensitivities were reviewed. The patient's tolerance                         of previous anesthesia was reviewed.                        - The risks and benefits of the procedure and the                         sedation options and risks were discussed with the                         patient. All questions were answered and informed                         consent was obtained.                        - ASA Grade Assessment: II - A patient with mild                         systemic disease.                        After obtaining informed consent, the endoscope was                         passed under direct vision. Throughout the procedure,                         the patient's blood pressure, pulse, and oxygen                         saturations were monitored continuously. The Endoscope                         was  introduced through the mouth, and advanced to the                         third part of duodenum. The upper GI endoscopy was                         accomplished with ease. The patient tolerated the                         procedure well. Findings:      The esophagus was  normal.      The stomach was normal.      The examined duodenum was normal. Impression:            - Normal esophagus.                        - Normal stomach.                        - Normal examined duodenum.                        - No specimens collected. Recommendation:        - Discharge patient to home (with escort).                        - Resume previous diet.                        - Continue present medications. Procedure Code(s):     --- Professional ---                        [phone], Esophagogastroduodenoscopy, flexible,                         transoral; diagnostic, including collection of                         specimen(s) by brushing or washing, when performed                         (separate procedure) Diagnosis Code(s):     --- Professional ---                        R63.4, Abnormal weight loss CPT copyright 2022 American Medical Association. All rights reserved. The codes documented in this report are preliminary and upon coder review may  be revised to meet current compliance requirements. Wyline Mood, MD Wyline Mood MD, MD 02/07/2023 11:13:51 AM This report has been signed electronically. Number of Addenda: 0 Note Initiated On: 02/07/2023 10:55 AM Estimated Blood Loss:  Estimated blood loss: none.      [facility]

## 2023-02-07 NOTE — Transfer of Care (Signed)
Immediate Anesthesia Transfer of Care Note  Patient: Melody Stone  Procedure(s) Performed: ESOPHAGOGASTRODUODENOSCOPY (EGD) WITH PROPOFOL  Patient Location: PACU  Anesthesia Type:General  Level of Consciousness: drowsy  Airway & Oxygen Therapy: Patient Spontanous Breathing  Post-op Assessment: Report given to RN and Post -op Vital signs reviewed and stable  Post vital signs: stable  Last Vitals:  Vitals Value Taken Time  BP 120/51 02/07/23 1118  Temp    Pulse 68 02/07/23 1119  Resp 12 02/07/23 1119  SpO2 99 % 02/07/23 1119  Vitals shown include unvalidated device data.  Last Pain:  Vitals:   02/07/23 1032  TempSrc: Temporal         Complications: No notable events documented.

## 2023-02-08 ENCOUNTER — Encounter: Payer: Self-pay | Admitting: Gastroenterology

## 2023-02-08 NOTE — Anesthesia Postprocedure Evaluation (Signed)
Anesthesia Post Note  Patient: Melody Stone  Procedure(s) Performed: ESOPHAGOGASTRODUODENOSCOPY (EGD) WITH PROPOFOL  Patient location during evaluation: Endoscopy Anesthesia Type: General Level of consciousness: awake and alert Pain management: pain level controlled Vital Signs Assessment: post-procedure vital signs reviewed and stable Respiratory status: spontaneous breathing, nonlabored ventilation, respiratory function stable and patient connected to nasal cannula oxygen Cardiovascular status: blood pressure returned to baseline and stable Postop Assessment: no apparent nausea or vomiting Anesthetic complications: no  No notable events documented.   Last Vitals:  Vitals:   02/07/23 1032 02/07/23 1117  BP: (!) 180/69 (!) 120/51  Pulse: (!) 58 69  Resp: 16 14  Temp: (!) 36.2 C (!) 36.1 C  SpO2: 100% 98%    Last Pain:  Vitals:   02/07/23 1137  TempSrc:   PainSc: 0-No pain                 Stephanie Coup

## 2023-04-13 ENCOUNTER — Other Ambulatory Visit (INDEPENDENT_AMBULATORY_CARE_PROVIDER_SITE_OTHER): Payer: Self-pay | Admitting: Nurse Practitioner

## 2023-04-13 DIAGNOSIS — I701 Atherosclerosis of renal artery: Secondary | ICD-10-CM

## 2023-04-17 ENCOUNTER — Ambulatory Visit (INDEPENDENT_AMBULATORY_CARE_PROVIDER_SITE_OTHER): Payer: Medicare Other | Admitting: Nurse Practitioner

## 2023-04-17 ENCOUNTER — Ambulatory Visit (INDEPENDENT_AMBULATORY_CARE_PROVIDER_SITE_OTHER): Payer: Medicare Other

## 2023-04-17 ENCOUNTER — Encounter (INDEPENDENT_AMBULATORY_CARE_PROVIDER_SITE_OTHER): Payer: Self-pay | Admitting: Nurse Practitioner

## 2023-04-17 VITALS — BP 151/64 | HR 63 | Resp 16 | Ht 64.0 in | Wt 117.0 lb

## 2023-04-17 DIAGNOSIS — I701 Atherosclerosis of renal artery: Secondary | ICD-10-CM

## 2023-04-17 DIAGNOSIS — I150 Renovascular hypertension: Secondary | ICD-10-CM | POA: Diagnosis not present

## 2023-04-18 NOTE — Progress Notes (Signed)
Subjective:    Patient ID: Melody Stone, female    DOB: [birth year], 72 y.o.   MRN: [id number] Chief Complaint  Patient presents with   Follow-up    3 months renal    The patient returns to the office for followup and review of the noninvasive studies regarding renal vascular hypertension and renal artery stenosis.  The patient had a left renal stent placed on 12/15/2022.  There has been improvement in the patient's blood pressure control.the patient attendant denies any major changes in their medications.  The patient denies headache or flushing.  No flank or unusual back pain.  Currently the patient is not able to participate in her care, the attendant concerns majority of questions.  There have been no significant changes to the patient's overall health care.   The patient denies amaurosis fugax or recent TIA symptoms. There are no recent neurological changes noted. There is no history of DVT, PE or superficial thrombophlebitis. No recent episodes of angina or shortness of breath documented.   Duplex ultrasound of the bilateral renal arteries reveals about 1 to 59% stenosis.  There is a cyst noted on the left kidney.  The new stent is patent.  Angiogram noted about an 80% stenosis of the right renal artery     Review of Systems  Unable to perform ROS: Dementia  Neurological:  Positive for weakness.  Psychiatric/Behavioral:  Positive for confusion and decreased concentration.   All other systems reviewed and are negative. Most      Objective:   Physical Exam Vitals reviewed.  HENT:     Head: Normocephalic.  Cardiovascular:     Rate and Rhythm: Normal rate.  Pulmonary:     Effort: Pulmonary effort is normal.  Skin:    General: Skin is warm and dry.  Neurological:     Mental Status: She is alert. She is disoriented.  Psychiatric:        Attention and Perception: She is inattentive.        Speech: She is noncommunicative.        Behavior: Behavior is cooperative.         Thought Content: Thought content normal.        Cognition and Memory: Cognition is impaired. Memory is impaired.        Judgment: Judgment normal.     BP (!) 151/64 (BP Location: Left Arm)   Pulse 63   Resp 16   Ht 5\' 4"  (1.626 m)   Wt 117 lb (53.1 kg)   BMI 20.08 kg/m   Past Medical History:  Diagnosis Date   Anemia    Anxiety    Aortic insufficiency    a. 10/2015 Echo: Nl LV fxn, mild to moderate AI; b. 02/2017 Echo: EF 60-65%, no rwma, mild AI.   Cataracts, bilateral    Chest pain    a. 10/2015 Ex MV Gavin Potters): EF 69%, no ischemia; b. 01/2017 Lexiscan MV: EF 45%, no ischemia/infarct-->Low risk.   Dementia (HCC)    Dyspnea on exertion    Esophagitis    Hepatitis C    HLD (hyperlipidemia)    Hypertension    Intermittent explosive disorder    Mental retardation    Panic disorder    Reflux esophagitis    Renal failure     Social History   Socioeconomic History   Marital status: Single    Spouse name: Not on file   Number of children: Not on file   Years of education:  Not on file   Highest education level: Not on file  Occupational History   Not on file  Tobacco Use   Smoking status: Never   Smokeless tobacco: Never  Vaping Use   Vaping status: Never Used  Substance and Sexual Activity   Alcohol use: No   Drug use: No   Sexual activity: Not on file  Other Topics Concern   Not on file  Social History Narrative   Patient lives in a group home.  She participates in activities regularly.  Her exercise tolerance has fallen off over the past year.  She is no longer routinely exercising/going for walks.   Social Determinants of Health   Financial Resource Strain: Not on file  Food Insecurity: Not on file  Transportation Needs: Not on file  Physical Activity: Not on file  Stress: Not on file  Social Connections: Not on file  Intimate Partner Violence: Not on file    Past Surgical History:  Procedure Laterality Date   ABDOMINAL HYSTERECTOMY      APPENDECTOMY     ESOPHAGOGASTRODUODENOSCOPY (EGD) WITH PROPOFOL N/A 02/07/2023   Procedure: ESOPHAGOGASTRODUODENOSCOPY (EGD) WITH PROPOFOL;  Surgeon: Wyline Mood, MD;  [facility]: [facility] ENDOSCOPY;  Service: Gastroenterology;  Laterality: N/A;  [name] Solarz - BROTHER IS LEGAL GUARDIAN WILL GIVE VERBAL CONSENT ON PHONE # [phone] PATIENT IS IN [facility]   RENAL ANGIOGRAPHY N/A 12/15/2022   Procedure: RENAL ANGIOGRAPHY;  Surgeon: Annice Needy, MD;  Location: [facility] INVASIVE CV LAB;  Service: Cardiovascular;  Laterality: N/A;    Family History  Problem Relation Age of Onset   Heart attack Mother        CABG in her 59's   Cancer Mother    CAD Brother        20   Hypertension Brother        6    Allergies  Allergen Reactions   Other Other (See Comments)    Psychotropic drugs cause kidney failure. Psychotropic drug cause renal failure. Psychotropic drugs cause kidney failure.   Ciprofloxacin Rash    Per caregiver.  Per caregiver.  Per caregiver.       Latest Ref Rng & Units 11/01/2022    5:49 PM 07/14/2022    1:37 PM 12/31/2021    4:24 PM  CBC  WBC 4.0 - 10.5 K/uL 8.2  9.7  11.7   Hemoglobin 12.0 - 15.0 g/dL 40.1  02.7  9.9   Hematocrit 36.0 - 46.0 % 33.2  30.7  29.5   Platelets 150 - 400 K/uL 241  220  197       CMP     Component Value Date/Time   NA 137 11/01/2022 1749   K 3.9 11/01/2022 1749   CL 104 11/01/2022 1749   CO2 24 11/01/2022 1749   GLUCOSE 88 11/01/2022 1749   BUN 12 12/15/2022 1409   CREATININE 0.52 12/15/2022 1409   CALCIUM 10.0 11/01/2022 1749   PROT 7.1 11/01/2022 2020   ALBUMIN 3.9 11/01/2022 2020   AST 23 11/01/2022 2020   ALT 14 11/01/2022 2020   ALKPHOS 104 11/01/2022 2020   BILITOT 0.9 11/01/2022 2020   GFRNONAA >60 12/15/2022 1409   GFRAA >60 08/09/2019 0703     No results found.     Assessment & Plan:   1. Renal artery stenosis (HCC) Recommend:  BP today was acceptable Given patient's atherosclerosis and PAD optimal  control of the patient's hypertension is important.  The patient's BP and noninvasive  studies support the previous intervention is patent. No further intervention is indicated at this time.  Pending the patient's blood pressure control over the next few months we may consider intervention on the right renal artery  Therefore the patient  will continue the current medications, no changes at this time.  The primary medical service will continue aggressive antihypertensive therapy as per the AHA guidelines   2. Hyperlipidemia, unspecified hyperlipidemia type Continue statin as ordered and reviewed, no changes at this time   Current Outpatient Medications on File Prior to Visit  Medication Sig Dispense Refill   Aloe Vera OIL by Does not apply route. Cream     aspirin EC 81 MG tablet Take 1 tablet (81 mg total) by mouth daily. Swallow whole. 150 tablet 2   B Complex-C-Folic Acid (B-COMPLEX/FOLIC ACID/VITAMIN C) TBCR Take 1 tablet by mouth daily.     Brimonidine Tartrate (LUMIFY) 0.025 % SOLN Apply to eye.     chlorhexidine (PERIDEX) 0.12 % solution SMARTSIG:By Mouth     Cholecalciferol (VITAMIN D3) 1000 units CAPS Take 2,000 Units by mouth daily.      clonazePAM (KLONOPIN) 0.5 MG tablet Take 0.25-0.5 mg by mouth See admin instructions. Take  tablet (0.25mg ) by mouth every morning and 1 tablet (1mg ) by mouth every evening     clopidogrel (PLAVIX) 75 MG tablet Take 1 tablet (75 mg total) by mouth daily. 30 tablet 11   docusate sodium (COLACE) 100 MG capsule Take 200 mg by mouth daily.     donepezil (ARICEPT) 5 MG tablet Take 5 mg by mouth daily.     memantine (NAMENDA) 10 MG tablet Take 10 mg by mouth 2 (two) times daily.     pantoprazole (PROTONIX) 40 MG tablet Take 1 tablet (40 mg total) by mouth daily for 14 days. 14 tablet 0   venlafaxine XR (EFFEXOR-XR) 37.5 MG 24 hr capsule Take 37.5 mg by mouth daily.     verapamil (CALAN-SR) 180 MG CR tablet Take 180 mg by mouth 2 (two) times daily.       atorvastatin (LIPITOR) 10 MG tablet Take 1 tablet (10 mg total) by mouth daily. (Patient not taking: Reported on 04/17/2023) 30 tablet 11   gentamicin (GARAMYCIN) 0.3 % ophthalmic solution Place 1 drop into the left eye 3 (three) times daily. (Patient not taking: Reported on 04/17/2023)     ibuprofen (ADVIL,MOTRIN) 200 MG tablet Take 600 mg by mouth 2 (two) times daily. (Patient not taking: Reported on 01/26/2023)     loratadine (CLARITIN) 10 MG tablet Take 10 mg by mouth daily.     losartan (COZAAR) 50 MG tablet Take 1 tablet by mouth daily. (Patient not taking: Reported on 01/26/2023)     Olopatadine HCl 0.2 % SOLN Place 1 drop into both eyes daily. (Patient not taking: Reported on 04/17/2023)     polyethylene glycol (MIRALAX) 17 g packet Take 17 g by mouth daily. (Patient not taking: Reported on 04/17/2023) 14 each 0   SYSTANE BALANCE 0.6 % SOLN Apply 1 drop to eye 4 (four) times daily. (Patient not taking: Reported on 04/17/2023)     [DISCONTINUED] PARoxetine (PAXIL) 30 MG tablet Take 30 mg by mouth at bedtime.      No current facility-administered medications on file prior to visit.    There are no Patient Instructions on file for this visit. No follow-ups on file.   Georgiana Spinner, NP

## 2023-09-01 NOTE — Progress Notes (Signed)
 Patient Profile:   Melody Stone  is a 73 y.o.  female No chief complaint on file.     PROBLEM LIST: Past Medical History:  Diagnosis Date  . Anxiety, generalized   . Benign essential hypertension 03/30/2016  . Esophagitis   . GAD (generalized anxiety disorder)   . HTN (hypertension)   . Hx of renal failure   . Intermittent explosive disorder   . 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 Nuclear sclerotic cataract of both eyes   . Panic disorder without agoraphobia   . Reflux esophagitis   . Severe intellectual disabilities     Past Surgical History:  Procedure Laterality Date  . APPENDECTOMY    . OTHER SURGERY     Stent placement per caregiver 12/16/2022.  [facility]      ALLERGIES: Allergies  Allergen Reactions  . Ciprofloxacin Rash    Per caregiver.    CURRENT MEDICATIONS: Current Outpatient Medications  Medication Sig Dispense Refill  . ALLERGY RELIEF, LORATADINE, 10 mg tablet TAKE 1 TABLET BY MOUTH ONCE DAILY AS NEEDED 30 tablet 10  . ALOE VERA TOPICAL Apply topically Cream as directed as needed    . aspirin  81 MG EC tablet Take 81 mg by mouth once daily    . atorvastatin  (LIPITOR) 10 MG tablet Take 1 tablet (10 mg total) by mouth once daily 30 tablet 11  . brimonidine (LUMIFY) 0.025 % Drop Apply 1 drop to eye once daily One drop each eye once daily 7.5 mL 11  . chlorhexidine  (PERIDEX ) 0.12 % solution Take 15 mls twice a day    . CHOLECALCIFEROL 25 mcg (1,000 unit) tablet TAKE 2 TABLETS BY MOUTH ONCE DAY FOR SUPPLEMENT 60 tablet 11  . clonazePAM  (KLONOPIN ) 0.5 MG tablet TAKE 1/2 TABLET BY MOUTH EVERY NIGHT AT BEDTIME *HAZARDOUS DRUG: WEAR GLOVES* 45 tablet 1  . clonazePAM  (KLONOPIN ) 0.5 MG tablet TAKE 1/2 TABLET BY MOUTH EVERY NIGHT AT BEDTIME *HAZARDOUS DRUG: WEAR GLOVES* 30 tablet 5  . clopidogreL  (PLAVIX ) 75 mg tablet Take 1 tablet (75 mg total) by mouth once daily 30 tablet 11  . docusate (COLACE) 100  MG capsule Take 200 mg by mouth once daily       . donepeziL  (ARICEPT ) 10 MG tablet Take 1 tablet (10 mg total) by mouth once daily 30 tablet 11  . memantine  (NAMENDA ) 10 MG tablet Take 1 tablet (10 mg total) by mouth 2 (two) times daily 180 tablet 3  . pantoprazole  (PROTONIX ) 40 MG DR tablet Take 1 tablet (40 mg total) by mouth 2 (two) times daily before meals 60 tablet 11  . QUEtiapine  (SEROQUEL ) 25 MG tablet Take 1 tablet (25 mg total) by mouth at bedtime 30 tablet 6  . venlafaxine  (EFFEXOR -XR) 75 MG XR capsule Take 1 capsule (75 mg total) by mouth once daily 30 capsule 11  . verapamiL  (CALAN -SR) 180 MG SR tablet Take 1 tablet (180 mg total) by mouth 2 (two) times daily 60 tablet 11   No current facility-administered medications for this visit.      HPI   CLINICAL SUMMARY:  Patient still requires pured foods for dysphagia.  Still goes to the program.  No focal complaints.  Memory about the same.  ROS: Review of systems is unremarkable for any active cardiac, respiratory, GI, GU, hematologic,  neurologic, dermatologic, HEENT, or psychiatric symptoms except as noted above, 10 systems reviewed.  No fevers, chills, or constitutional symptoms.   PHYSICAL EXAM  Vital signs:  BP 130/60   Wt 50.5 kg (111 lb 6.4 oz)   BMI 20.38 kg/m  Body mass index is 20.38 kg/m.   Wt Readings from Last 3 Encounters:  09/01/23 50.5 kg (111 lb 6.4 oz)  06/27/23 53.5 kg (118 lb)  06/13/23 52.6 kg (116 lb)     BP Readings from Last 3 Encounters:  09/01/23 130/60  06/27/23 110/70  06/13/23 120/60    Constitutional:NAD Neck: supple, no thyromegaly, good ROM Respiratory:clear to auscultation, no rales or wheezes Cardiovascular:RRR, no murmur or gallop Abdominal:soft, good BS, NT Ext: no edema, good peripheral pulses Neuro: alert and oriented X 3, grossly nonfocal     ASSESSMENT/PLAN   Dementia-mild to moderate, less agitated on medicine, followed by neurology Dysphagia-on twice daily  Protonix , pured food AI-asymptomatic, no significant dyspnea Hyperlipidemia-on Lipitor Depression-on Effexor  plus Klonopin   Dispo:   Return in about 6 months (around 02/29/2024) for physical.

## 2023-09-06 ENCOUNTER — Encounter (INDEPENDENT_AMBULATORY_CARE_PROVIDER_SITE_OTHER): Payer: Self-pay | Admitting: Nurse Practitioner

## 2023-10-13 ENCOUNTER — Other Ambulatory Visit (INDEPENDENT_AMBULATORY_CARE_PROVIDER_SITE_OTHER): Payer: Self-pay | Admitting: Nurse Practitioner

## 2023-10-13 DIAGNOSIS — I701 Atherosclerosis of renal artery: Secondary | ICD-10-CM

## 2023-10-18 ENCOUNTER — Encounter (INDEPENDENT_AMBULATORY_CARE_PROVIDER_SITE_OTHER): Payer: Medicare Other

## 2023-10-18 ENCOUNTER — Ambulatory Visit (INDEPENDENT_AMBULATORY_CARE_PROVIDER_SITE_OTHER): Payer: Medicare Other | Admitting: Nurse Practitioner

## 2023-11-14 ENCOUNTER — Ambulatory Visit (INDEPENDENT_AMBULATORY_CARE_PROVIDER_SITE_OTHER): Payer: Medicare Other | Admitting: Nurse Practitioner

## 2023-11-14 ENCOUNTER — Encounter (INDEPENDENT_AMBULATORY_CARE_PROVIDER_SITE_OTHER): Payer: Self-pay | Admitting: Nurse Practitioner

## 2023-11-14 ENCOUNTER — Ambulatory Visit (INDEPENDENT_AMBULATORY_CARE_PROVIDER_SITE_OTHER): Payer: Medicare Other

## 2023-11-14 VITALS — BP 128/64 | HR 63 | Resp 15

## 2023-11-14 DIAGNOSIS — E785 Hyperlipidemia, unspecified: Secondary | ICD-10-CM

## 2023-11-14 DIAGNOSIS — I701 Atherosclerosis of renal artery: Secondary | ICD-10-CM

## 2023-11-14 NOTE — Progress Notes (Signed)
 Subjective:    Patient ID: Melody Stone, female    DOB: Sep 14, 1950, 73 y.o.   MRN: [id number] Chief Complaint  Patient presents with   Follow-up    6 month renal follow up    The patient returns to the office for followup and review of the noninvasive studies regarding renal vascular hypertension and renal artery stenosis.  The patient had a left renal stent placed on 12/15/2022.  There has been improvement in the patient's blood pressure control.the patient attendant denies any major changes in their medications.  The patient denies headache or flushing.  No flank or unusual back pain.  Currently the patient is not able to participate in her care, the attendant concerns majority of questions.  There have been no significant changes to the patient's overall health care.   The patient denies amaurosis fugax or recent TIA symptoms. There are no recent neurological changes noted. There is no history of DVT, PE or superficial thrombophlebitis. No recent episodes of angina or shortness of breath documented.   Duplex ultrasound of the bilateral renal arteries reveals about 1 to 59% stenosis.  There is a cyst noted on the left kidney.  The new stent is patent.  Today the left renal artery has 1 to 59% stenosis however velocities are near the higher end of this.     Review of Systems  Unable to perform ROS: Dementia  Neurological:  Positive for weakness.  Psychiatric/Behavioral:  Positive for confusion and decreased concentration.   All other systems reviewed and are negative. Most      Objective:   Physical Exam Vitals reviewed.  HENT:     Head: Normocephalic.  Cardiovascular:     Rate and Rhythm: Normal rate.  Pulmonary:     Effort: Pulmonary effort is normal.  Skin:    General: Skin is warm and dry.  Neurological:     Mental Status: She is alert. She is disoriented.  Psychiatric:        Attention and Perception: She is inattentive.        Speech: She is noncommunicative.         Behavior: Behavior is cooperative.        Thought Content: Thought content normal.        Cognition and Memory: Cognition is impaired. Memory is impaired.        Judgment: Judgment normal.     BP 128/64   Pulse 63   Resp 15   Past Medical History:  Diagnosis Date   Anemia    Anxiety    Aortic insufficiency    a. 10/2015 Echo: Nl LV fxn, mild to moderate AI; b. 02/2017 Echo: EF 60-65%, no rwma, mild AI.   Cataracts, bilateral    Chest pain    a. 10/2015 Ex MV Melody Stone): EF 69%, no ischemia; b. 01/2017 Lexiscan MV: EF 45%, no ischemia/infarct-->Low risk.   Dementia (HCC)    Dyspnea on exertion    Esophagitis    Hepatitis C    HLD (hyperlipidemia)    Hypertension    Intermittent explosive disorder    Mental retardation    Panic disorder    Reflux esophagitis    Renal failure     Social History   Socioeconomic History   Marital status: Single    Spouse name: Not on file   Number of children: Not on file   Years of education: Not on file   Highest education level: Not on file  Occupational History  Not on file  Tobacco Use   Smoking status: Never   Smokeless tobacco: Never  Vaping Use   Vaping status: Never Used  Substance and Sexual Activity   Alcohol use: No   Drug use: No   Sexual activity: Not on file  Other Topics Concern   Not on file  Social History Narrative   Patient lives in a group home.  She participates in activities regularly.  Her exercise tolerance has fallen off over the past year.  She is no longer routinely exercising/going for walks.   Social Drivers of Corporate investment banker Strain: Not on file  Food Insecurity: Not on file  Transportation Needs: Not on file  Physical Activity: Not on file  Stress: Not on file  Social Connections: Not on file  Intimate Partner Violence: Not on file    Past Surgical History:  Procedure Laterality Date   ABDOMINAL HYSTERECTOMY     APPENDECTOMY     ESOPHAGOGASTRODUODENOSCOPY (EGD) WITH  PROPOFOL N/A 02/07/2023   Procedure: ESOPHAGOGASTRODUODENOSCOPY (EGD) WITH PROPOFOL;  Surgeon: Melody Mood, MD;  Location: [facility] ENDOSCOPY;  Service: Gastroenterology;  Laterality: N/A;  Melody Stone - BROTHER IS LEGAL GUARDIAN WILL GIVE VERBAL CONSENT ON PHONE # [phone] PATIENT IS IN [facility]   RENAL ANGIOGRAPHY N/A 12/15/2022   Procedure: RENAL ANGIOGRAPHY;  Surgeon: Melody Needy, MD;  Location: [facility] INVASIVE CV LAB;  Service: Cardiovascular;  Laterality: N/A;    Family History  Problem Relation Age of Onset   Heart attack Mother        CABG in her 44's   Cancer Mother    CAD Brother        73   Hypertension Brother        75    Allergies  Allergen Reactions   Other Other (See Comments)    Psychotropic drugs cause kidney failure. Psychotropic drug cause renal failure. Psychotropic drugs cause kidney failure.   Ciprofloxacin Rash    Per caregiver.  Per caregiver.  Per caregiver.       Latest Ref Rng & Units 11/01/2022    5:49 PM 07/14/2022    1:37 PM 12/31/2021    4:24 PM  CBC  WBC 4.0 - 10.5 K/uL 8.2  9.7  11.7   Hemoglobin 12.0 - 15.0 g/dL 62.9  52.8  9.9   Hematocrit 36.0 - 46.0 % 33.2  30.7  29.5   Platelets 150 - 400 K/uL 241  220  197       CMP     Component Value Date/Time   NA 137 11/01/2022 1749   K 3.9 11/01/2022 1749   CL 104 11/01/2022 1749   CO2 24 11/01/2022 1749   GLUCOSE 88 11/01/2022 1749   BUN 12 12/15/2022 1409   CREATININE 0.52 12/15/2022 1409   CALCIUM 10.0 11/01/2022 1749   PROT 7.1 11/01/2022 2020   ALBUMIN 3.9 11/01/2022 2020   AST 23 11/01/2022 2020   ALT 14 11/01/2022 2020   ALKPHOS 104 11/01/2022 2020   BILITOT 0.9 11/01/2022 2020   GFRNONAA >60 12/15/2022 1409   GFRAA >60 08/09/2019 0703     No results found.     Assessment & Plan:   1. Renal artery stenosis (HCC) Recommend:  BP today was acceptable Given patient's atherosclerosis and PAD optimal control of the patient's hypertension is important.  The  patient's BP and noninvasive studies support the previous intervention is patent. No further intervention is indicated at this time.  Pending the patient's blood pressure control over the next few months we may consider intervention on the right renal artery  Therefore the patient  will continue the current medications, no changes at this time.  The primary medical service will continue aggressive antihypertensive therapy as per the AHA guidelines   2. Hyperlipidemia, unspecified hyperlipidemia type Continue statin as ordered and reviewed, no changes at this time   Current Outpatient Medications on File Prior to Visit  Medication Sig Dispense Refill   Aloe Vera OIL by Does not apply route. Cream     aspirin EC 81 MG tablet Take 1 tablet (81 mg total) by mouth daily. Swallow whole. 150 tablet 2   atorvastatin (LIPITOR) 10 MG tablet Take 1 tablet (10 mg total) by mouth daily. 30 tablet 11   B Complex-C-Folic Acid (B-COMPLEX/FOLIC ACID/VITAMIN C) TBCR Take 1 tablet by mouth daily.     chlorhexidine (PERIDEX) 0.12 % solution SMARTSIG:By Mouth     Cholecalciferol (VITAMIN D3) 1000 units CAPS Take 2,000 Units by mouth daily.      clonazePAM (KLONOPIN) 0.5 MG tablet Take 0.25-0.5 mg by mouth See admin instructions. Take  tablet (0.25mg ) by mouth every morning and 1 tablet (1mg ) by mouth every evening     clopidogrel (PLAVIX) 75 MG tablet Take 1 tablet (75 mg total) by mouth daily. 30 tablet 11   docusate sodium (COLACE) 100 MG capsule Take 200 mg by mouth daily.     donepezil (ARICEPT) 5 MG tablet Take 5 mg by mouth daily.     loratadine (CLARITIN) 10 MG tablet Take 10 mg by mouth daily.     memantine (NAMENDA) 10 MG tablet Take 10 mg by mouth 2 (two) times daily.     pantoprazole (PROTONIX) 40 MG tablet Take 1 tablet (40 mg total) by mouth daily for 14 days. 14 tablet 0   QUEtiapine (SEROQUEL) 25 MG tablet Take 25 mg by mouth at bedtime.     senna (SENOKOT) 8.6 MG tablet Take 1 tablet by  mouth daily.     triamcinolone cream (KENALOG) 0.1 % Apply 1 Application topically as needed.     venlafaxine XR (EFFEXOR-XR) 37.5 MG 24 hr capsule Take 37.5 mg by mouth daily.     verapamil (CALAN-SR) 180 MG CR tablet Take 180 mg by mouth 2 (two) times daily.      Brimonidine Tartrate (LUMIFY) 0.025 % SOLN Apply to eye. (Patient not taking: Reported on 11/14/2023)     gentamicin (GARAMYCIN) 0.3 % ophthalmic solution Place 1 drop into the left eye 3 (three) times daily. (Patient not taking: Reported on 11/14/2023)     ibuprofen (ADVIL,MOTRIN) 200 MG tablet Take 600 mg by mouth 2 (two) times daily. (Patient not taking: Reported on 01/26/2023)     losartan (COZAAR) 50 MG tablet Take 1 tablet by mouth daily. (Patient not taking: Reported on 01/26/2023)     Olopatadine HCl 0.2 % SOLN Place 1 drop into both eyes daily. (Patient not taking: Reported on 04/17/2023)     polyethylene glycol (MIRALAX) 17 g packet Take 17 g by mouth daily. (Patient not taking: Reported on 04/17/2023) 14 each 0   SYSTANE BALANCE 0.6 % SOLN Apply 1 drop to eye 4 (four) times daily. (Patient not taking: Reported on 04/17/2023)     [DISCONTINUED] PARoxetine (PAXIL) 30 MG tablet Take 30 mg by mouth at bedtime.      No current facility-administered medications on file prior to visit.    There are no Patient  Instructions on file for this visit. No follow-ups on file.   Georgiana Spinner, NP

## 2024-01-16 ENCOUNTER — Encounter (INDEPENDENT_AMBULATORY_CARE_PROVIDER_SITE_OTHER): Payer: Self-pay

## 2024-03-05 ENCOUNTER — Other Ambulatory Visit (INDEPENDENT_AMBULATORY_CARE_PROVIDER_SITE_OTHER): Payer: Self-pay | Admitting: Vascular Surgery

## 2024-03-22 ENCOUNTER — Other Ambulatory Visit: Payer: Self-pay

## 2024-03-22 ENCOUNTER — Emergency Department

## 2024-03-22 ENCOUNTER — Emergency Department
Admission: EM | Admit: 2024-03-22 | Discharge: 2024-03-22 | Disposition: A | Discharge status: Home / Self Care | Attending: Emergency Medicine | Admitting: Emergency Medicine

## 2024-03-22 ENCOUNTER — Encounter: Payer: Self-pay | Admitting: Emergency Medicine

## 2024-03-22 DIAGNOSIS — W01198A Fall on same level from slipping, tripping and stumbling with subsequent striking against other object, initial encounter: Secondary | ICD-10-CM | POA: Diagnosis not present

## 2024-03-22 DIAGNOSIS — W19XXXA Unspecified fall, initial encounter: Secondary | ICD-10-CM

## 2024-03-22 DIAGNOSIS — S0990XA Unspecified injury of head, initial encounter: Secondary | ICD-10-CM | POA: Diagnosis present

## 2024-03-22 DIAGNOSIS — I10 Essential (primary) hypertension: Secondary | ICD-10-CM | POA: Insufficient documentation

## 2024-03-22 DIAGNOSIS — S0181XA Laceration without foreign body of other part of head, initial encounter: Secondary | ICD-10-CM | POA: Insufficient documentation

## 2024-03-22 DIAGNOSIS — Z7982 Long term (current) use of aspirin: Secondary | ICD-10-CM | POA: Diagnosis not present

## 2024-03-22 DIAGNOSIS — G309 Alzheimer's disease, unspecified: Secondary | ICD-10-CM | POA: Insufficient documentation

## 2024-03-22 NOTE — ED Triage Notes (Signed)
 Pt in via [facility] from [facility].  Per EMS, patient tripped and fell while getting out of a vehicle PTA.  EMS reports small laceration to right anterior head, dry dressing in place.  Excoriation noted to right elbow as well.  Caregiver is with patient, reports cognition is not changed from baseline.  Patient is on Aspirin , no other blood thinners.  Vitals WDL.

## 2024-03-22 NOTE — Discharge Instructions (Addendum)
 The patient Melody Stone was diagnosed with a fall, laceration of the forehead, abrasion of the skin of the right elbow.  Please avoid water in the area covered by the glue.  Please come back to ED or go to her PCP if she has new symptoms or symptoms worsen.  CT scan of the head and CT cervical CT were negative for fractures or intracranial hemorrhage.

## 2024-03-22 NOTE — ED Provider Notes (Addendum)
 [facility] Provider Note    Event Date/Time   First MD Initiated Contact with Patient 03/22/24 1920     (approximate)   History   Fall    HPI  Melody Stone is a 73 y.o. female    with a past medical history of renal artery stenosis, Alzheimer's, iron deficiency anemia, left clavicular fracture, hypertension, history who presents to the ED complaining of fall  . According to the patient's nurse patient was eating out of the minivan and fell hitting her right side of the head.  No loss of consciousness.  Patient is taking aspirin .  Patient also complains of a radiation of the skin in the right elbow.  Patient has Alzheimer.  Per independent chart review patient is taking Plavix .     Patient Active Problem List   Diagnosis Date Noted   Unintentional weight loss 02/07/2023   Renal artery stenosis (HCC) 11/29/2022   Hypertension, 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   Pelvic fracture (HCC) 12/29/2021   Fall    Pubic ramus fracture, right, closed, initial encounter (HCC) 12/28/2021   Mild Alzheimer's dementia (HCC) 07/21/2021   Medicare annual wellness visit, initial 02/27/2019   Benign essential hypertension 08/14/2018   Dysphagia, oropharyngeal 07/08/2016   Lung nodule, solitary 03/30/2016   Moderate aortic insufficiency 03/30/2016   HCV (hepatitis C virus) 02/19/2016   HLD (hyperlipidemia) 02/19/2016   BP (high blood pressure) 02/19/2016   Intellectual disability 02/19/2016   Esophagitis, reflux 02/19/2016   Breathlessness on exertion 10/27/2015     ROS: Patient currently denies any vision changes, tinnitus, difficulty speaking, facial droop, sore throat, chest pain, shortness of breath, abdominal pain, nausea/vomiting/diarrhea, dysuria, or weakness/numbness/paresthesias in any extremity   Physical Exam   Triage Vital Signs: ED Triage Vitals  Encounter Vitals Group     BP 03/22/24 1709 (!) 140/54     Girls Systolic BP Percentile --      Girls  Diastolic BP Percentile --      Boys Systolic BP Percentile --      Boys Diastolic BP Percentile --      Pulse Rate 03/22/24 1709 68     Resp 03/22/24 1709 15     Temp 03/22/24 1709 97.8 F (36.6 C)     Temp Source 03/22/24 1709 Oral     SpO2 03/22/24 1709 100 %     Weight 03/22/24 1710 120 lb (54.4 kg)     Height 03/22/24 1710 5' 3 (1.6 m)     Head Circumference --      Peak Flow --      Pain Score 03/22/24 1710 0     Pain Loc --      Pain Education --      Exclude from Growth Chart --     Most recent vital signs: Vitals:   03/22/24 1709  BP: (!) 140/54  Pulse: 68  Resp: 15  Temp: 97.8 F (36.6 C)  SpO2: 100%     Physical Exam Vitals and nursing note reviewed.   Constitutional:      General: Awake and alert. No acute distress.    Appearance: Normal appearance. The patient is normal weight.      Able to speak in complete sentences without cough or dyspnea  HENT:     Head: Normocephalic and atraumatic.     Mouth: Mucous membranes are moist.  Face: Presence of laceration with mild bleeding about 2 cm.  Presence of hematoma.  Eyes:  General: PERRL. Normal EOMs          Conjunctiva/sclera: Conjunctivae normal.  Nose No congestion/rhinorrhea  CV:                  Good peripheral perfusion.  Regular rate and rhythm  Resp:               Normal effort.  Equal breath sounds bilaterally.  Abd:                 No distention.  Soft, nontender.  No rebound or guarding.  Musculoskeletal:        General: No swelling. Normal range of motion.  Left upper extremity: Presence of abrasion of the skin in the posterior area of the elbow, no active bleeding, no sign of infections or fractures. Skin:    General: Skin is warm and dry.     Capillary Refill: Capillary refill takes less than 2 seconds.     Findings: No rash.  Neurological:     Mental Status: The patient is awake and alert. MAE.  Patient has Alzheimer, there is poor communication, she follows commands.spontaneously.  No gross focal neurologic deficits are appreciated.  Psychiatric Mood and affect are normal. Speech and behavior are normal.    ED Results / Procedures / Treatments   Labs (all labs ordered are listed, but only abnormal results are displayed) Labs Reviewed - No data to display   EKG     RADIOLOGY I independently reviewed and interpreted imaging and agree with radiologists findings.     PROCEDURES:  Critical Care performed:   .Laceration Repair  Date/Time: 03/22/2024 8:43 PM  Performed by: Janit Kast, PA-C Authorized by: Janit Kast, PA-C   Consent:    Consent obtained:  Verbal   Consent given by:  Guardian   Risks, benefits, and alternatives were discussed: yes     Risks discussed:  Pain, infection and poor wound healing Universal protocol:    Procedure explained and questions answered to patient or proxy's satisfaction: yes     Patient identity confirmed:  Arm band Anesthesia:    Anesthesia method:  None Laceration details:    Location:  Face   Face location:  Forehead   Length (cm):  2   Depth (mm):  2 Exploration:    Limited defect created (wound extended): no     Hemostasis achieved with:  Direct pressure   Imaging outcome: foreign body not noted   Treatment:    Area cleansed with:  Chlorhexidine   Amount of cleaning:  Standard   Irrigation solution:  Sterile saline   Irrigation volume:  300   Irrigation method:  Tap   Debridement:  None Skin repair:    Repair method:  Tissue adhesive Approximation:    Approximation:  Close Repair type:    Repair type:  Simple Post-procedure details:    Dressing:  Non-adherent dressing   Procedure completion:  Tolerated well, no immediate complications    MEDICATIONS ORDERED IN ED: Medications - No data to display Clinical Course as of 03/22/24 2042  Fri Mar 22, 2024  2003 CT Cervical Spine Wo Contrast Moderate cervical spondylosis.  No acute bony abnormality. [AE]  2003 CT Head Wo  Contrast Atrophy, chronic microvascular disease.  No acute intracranial abnormality.     [AE]    Clinical Course User Index [AE] Janit Kast, PA-C    IMPRESSION / MDM / ASSESSMENT AND PLAN / ED COURSE  I reviewed the triage vital signs  and the nursing notes.  Differential diagnosis includes, but is not limited to, laceration, ideation, fracture, intracranial hemorrhage  Patient's presentation is most consistent with acute complicated illness / injury requiring diagnostic workup.    Melody Stone is a 73 y.o., female who was brought today by her nurse after falling from the car and hitting her right side of the forehead and left elbow.  Physical exam there is presence of laceration about 2 cm length 2 mm depth with mild bleeding and creation of the skin in the right elbow.  I was able to apply glue in her forehead.  Patient's diagnosis is consistent with fall, laceration of the right forehead, abrasion of the skin of the right elbow. I independently reviewed and interpreted imaging and agree with radiologists findings.  I did not order any labs, physical exam is reassuring. I did review the patient's allergies and medications.The patient is in stable and satisfactory condition for discharge home  Patient will be discharged home without prescriptions. Patient is to follow up with PCP as needed or otherwise directed. Patient is given ED precautions to return to the ED for any worsening or new symptoms.  Did advise nurse to avoid water in the area that the glue was applied. Discussed plan of care with patient, answered all of patient's questions, Patient agreeable to plan of care. Advised patient to take medications according to the instructions on the label. Discussed possible side effects of new medications. Patient verbalized understanding.     FINAL CLINICAL IMPRESSION(S) / ED DIAGNOSES   Final diagnoses:  Fall, initial encounter  Facial laceration, initial encounter     Rx  / DC Orders   ED Discharge Orders     None        Note:  This document was prepared using Dragon voice recognition software and may include unintentional dictation errors.   Janit Kast, PA-C 03/22/24 2042    Janit Kast, PA-C 03/22/24 2045    Willo Dunnings, MD 03/22/24 [phone]

## 2024-04-01 ENCOUNTER — Emergency Department

## 2024-04-01 ENCOUNTER — Inpatient Hospital Stay (HOSPITAL_COMMUNITY)
Admission: EM | Admit: 2024-04-01 | Discharge: 2024-04-08 | DRG: 481 | Disposition: A | Discharge status: Skilled Nursing Facility | Attending: Internal Medicine | Admitting: Internal Medicine

## 2024-04-01 ENCOUNTER — Inpatient Hospital Stay (HOSPITAL_COMMUNITY)

## 2024-04-01 ENCOUNTER — Emergency Department (HOSPITAL_COMMUNITY)

## 2024-04-01 ENCOUNTER — Encounter (HOSPITAL_COMMUNITY): Payer: Self-pay

## 2024-04-01 ENCOUNTER — Other Ambulatory Visit: Payer: Self-pay

## 2024-04-01 ENCOUNTER — Encounter (HOSPITAL_COMMUNITY)
Admission: EM | Disposition: A | Discharge status: Skilled Nursing Facility | Payer: Self-pay | Source: Home / Self Care | Attending: Internal Medicine

## 2024-04-01 ENCOUNTER — Emergency Department (HOSPITAL_COMMUNITY): Admitting: Anesthesiology

## 2024-04-01 ENCOUNTER — Emergency Department
Admission: EM | Admit: 2024-04-01 | Discharge: 2024-04-01 | Disposition: A | Discharge status: Home / Self Care | Attending: Emergency Medicine | Admitting: Emergency Medicine

## 2024-04-01 ENCOUNTER — Inpatient Hospital Stay: Admit: 2024-04-01 | Admitting: Orthopedic Surgery

## 2024-04-01 ENCOUNTER — Encounter (HOSPITAL_COMMUNITY): Payer: Self-pay | Admitting: Orthopedic Surgery

## 2024-04-01 DIAGNOSIS — I10 Essential (primary) hypertension: Secondary | ICD-10-CM | POA: Diagnosis present

## 2024-04-01 DIAGNOSIS — S0990XA Unspecified injury of head, initial encounter: Secondary | ICD-10-CM | POA: Insufficient documentation

## 2024-04-01 DIAGNOSIS — S0181XA Laceration without foreign body of other part of head, initial encounter: Secondary | ICD-10-CM | POA: Insufficient documentation

## 2024-04-01 DIAGNOSIS — M898X9 Other specified disorders of bone, unspecified site: Secondary | ICD-10-CM | POA: Diagnosis present

## 2024-04-01 DIAGNOSIS — F79 Unspecified intellectual disabilities: Secondary | ICD-10-CM | POA: Diagnosis present

## 2024-04-01 DIAGNOSIS — K219 Gastro-esophageal reflux disease without esophagitis: Secondary | ICD-10-CM | POA: Diagnosis present

## 2024-04-01 DIAGNOSIS — Z8781 Personal history of (healed) traumatic fracture: Principal | ICD-10-CM

## 2024-04-01 DIAGNOSIS — I351 Nonrheumatic aortic (valve) insufficiency: Secondary | ICD-10-CM

## 2024-04-01 DIAGNOSIS — F840 Autistic disorder: Secondary | ICD-10-CM | POA: Diagnosis present

## 2024-04-01 DIAGNOSIS — B1920 Unspecified viral hepatitis C without hepatic coma: Secondary | ICD-10-CM | POA: Diagnosis present

## 2024-04-01 DIAGNOSIS — E785 Hyperlipidemia, unspecified: Secondary | ICD-10-CM | POA: Diagnosis present

## 2024-04-01 DIAGNOSIS — Z881 Allergy status to other antibiotic agents status: Secondary | ICD-10-CM | POA: Diagnosis not present

## 2024-04-01 DIAGNOSIS — W19XXXA Unspecified fall, initial encounter: Secondary | ICD-10-CM | POA: Diagnosis present

## 2024-04-01 DIAGNOSIS — Z8249 Family history of ischemic heart disease and other diseases of the circulatory system: Secondary | ICD-10-CM | POA: Diagnosis not present

## 2024-04-01 DIAGNOSIS — D62 Acute posthemorrhagic anemia: Secondary | ICD-10-CM | POA: Diagnosis not present

## 2024-04-01 DIAGNOSIS — S72401A Unspecified fracture of lower end of right femur, initial encounter for closed fracture: Secondary | ICD-10-CM

## 2024-04-01 DIAGNOSIS — G309 Alzheimer's disease, unspecified: Secondary | ICD-10-CM | POA: Diagnosis not present

## 2024-04-01 DIAGNOSIS — F32A Depression, unspecified: Secondary | ICD-10-CM | POA: Diagnosis present

## 2024-04-01 DIAGNOSIS — I739 Peripheral vascular disease, unspecified: Secondary | ICD-10-CM | POA: Diagnosis not present

## 2024-04-01 DIAGNOSIS — Z79899 Other long term (current) drug therapy: Secondary | ICD-10-CM | POA: Diagnosis not present

## 2024-04-01 DIAGNOSIS — Z90710 Acquired absence of both cervix and uterus: Secondary | ICD-10-CM | POA: Diagnosis not present

## 2024-04-01 DIAGNOSIS — I701 Atherosclerosis of renal artery: Secondary | ICD-10-CM | POA: Diagnosis present

## 2024-04-01 DIAGNOSIS — S72451K Displaced supracondylar fracture without intracondylar extension of lower end of right femur, subsequent encounter for closed fracture with nonunion: Secondary | ICD-10-CM | POA: Diagnosis not present

## 2024-04-01 DIAGNOSIS — M80851A Other osteoporosis with current pathological fracture, right femur, initial encounter for fracture: Secondary | ICD-10-CM | POA: Diagnosis present

## 2024-04-01 DIAGNOSIS — Z7902 Long term (current) use of antithrombotics/antiplatelets: Secondary | ICD-10-CM

## 2024-04-01 DIAGNOSIS — S0081XA Abrasion of other part of head, initial encounter: Secondary | ICD-10-CM | POA: Insufficient documentation

## 2024-04-01 DIAGNOSIS — S72451A Displaced supracondylar fracture without intracondylar extension of lower end of right femur, initial encounter for closed fracture: Principal | ICD-10-CM | POA: Diagnosis present

## 2024-04-01 DIAGNOSIS — F0390 Unspecified dementia without behavioral disturbance: Secondary | ICD-10-CM | POA: Diagnosis not present

## 2024-04-01 DIAGNOSIS — S79921A Unspecified injury of right thigh, initial encounter: Secondary | ICD-10-CM | POA: Diagnosis present

## 2024-04-01 DIAGNOSIS — D72829 Elevated white blood cell count, unspecified: Secondary | ICD-10-CM | POA: Diagnosis not present

## 2024-04-01 DIAGNOSIS — S72451D Displaced supracondylar fracture without intracondylar extension of lower end of right femur, subsequent encounter for closed fracture with routine healing: Secondary | ICD-10-CM | POA: Diagnosis not present

## 2024-04-01 DIAGNOSIS — F0393 Unspecified dementia, unspecified severity, with mood disturbance: Secondary | ICD-10-CM | POA: Diagnosis present

## 2024-04-01 HISTORY — DX: Autistic disorder: F84.0

## 2024-04-01 HISTORY — PX: ORIF FEMUR FRACTURE: SHX2119

## 2024-04-01 LAB — CBC WITH DIFFERENTIAL/PLATELET
Abs Immature Granulocytes: 0.1 K/uL — ABNORMAL HIGH (ref 0.00–0.07)
Basophils Absolute: 0.1 K/uL (ref 0.0–0.1)
Basophils Relative: 1 %
Eosinophils Absolute: 0.1 K/uL (ref 0.0–0.5)
Eosinophils Relative: 1 %
HCT: 29.8 % — ABNORMAL LOW (ref 36.0–46.0)
Hemoglobin: 10.3 g/dL — ABNORMAL LOW (ref 12.0–15.0)
Immature Granulocytes: 1 %
Lymphocytes Relative: 12 %
Lymphs Abs: 2 K/uL (ref 0.7–4.0)
MCH: 30.5 pg (ref 26.0–34.0)
MCHC: 34.6 g/dL (ref 30.0–36.0)
MCV: 88.2 fL (ref 80.0–100.0)
Monocytes Absolute: 1.1 K/uL — ABNORMAL HIGH (ref 0.1–1.0)
Monocytes Relative: 7 %
Neutro Abs: 13.4 K/uL — ABNORMAL HIGH (ref 1.7–7.7)
Neutrophils Relative %: 78 %
Platelets: 247 K/uL (ref 150–400)
RBC: 3.38 MIL/uL — ABNORMAL LOW (ref 3.87–5.11)
RDW: 13.7 % (ref 11.5–15.5)
WBC: 16.8 K/uL — ABNORMAL HIGH (ref 4.0–10.5)
nRBC: 0 % (ref 0.0–0.2)

## 2024-04-01 LAB — SURGICAL PCR SCREEN
MRSA, PCR: NEGATIVE
Staphylococcus aureus: NEGATIVE

## 2024-04-01 LAB — COMPREHENSIVE METABOLIC PANEL WITH GFR
ALT: 16 U/L (ref 0–44)
AST: 23 U/L (ref 15–41)
Albumin: 3.5 g/dL (ref 3.5–5.0)
Alkaline Phosphatase: 90 U/L (ref 38–126)
Anion gap: 14 (ref 5–15)
BUN: 21 mg/dL (ref 8–23)
CO2: 23 mmol/L (ref 22–32)
Calcium: 9.5 mg/dL (ref 8.9–10.3)
Chloride: 103 mmol/L (ref 98–111)
Creatinine, Ser: 0.65 mg/dL (ref 0.44–1.00)
GFR, Estimated: >60 mL/min (ref >60)
Glucose, Bld: 105 mg/dL — ABNORMAL HIGH (ref 70–99)
Potassium: 4.1 mmol/L (ref 3.5–5.1)
Sodium: 140 mmol/L (ref 135–145)
Total Bilirubin: 1 mg/dL (ref 0.0–1.2)
Total Protein: 6.5 g/dL (ref 6.5–8.1)

## 2024-04-01 MED ORDER — ONDANSETRON HCL 4 MG/2ML IJ SOLN: 4.0000 mg | Freq: Once | INTRAMUSCULAR | Status: DC | PRN

## 2024-04-01 MED ORDER — ROCURONIUM BROMIDE 10 MG/ML (PF) SYRINGE
PREFILLED_SYRINGE | INTRAVENOUS | Status: DC | PRN
  Administered 2024-04-01: 60 mg via INTRAVENOUS
  Administered 2024-04-01: 10 mg via INTRAVENOUS
  Administered 2024-04-01: 15 mg via INTRAVENOUS

## 2024-04-01 MED ORDER — SENNOSIDES-DOCUSATE SODIUM 8.6-50 MG PO TABS
1.0000 | ORAL_TABLET | Freq: Every day | ORAL | Status: DC
  Administered 2024-04-01 – 2024-04-07 (×7): 1 via ORAL
  Filled 2024-04-01 (×7): qty 1

## 2024-04-01 MED ORDER — SUGAMMADEX SODIUM 200 MG/2ML IV SOLN
INTRAVENOUS | Status: DC | PRN
  Administered 2024-04-01 (×2): 100 mg via INTRAVENOUS

## 2024-04-01 MED ORDER — PHENYLEPHRINE 80 MCG/ML (10ML) SYRINGE FOR IV PUSH (FOR BLOOD PRESSURE SUPPORT)
PREFILLED_SYRINGE | INTRAVENOUS | Status: DC | PRN
  Administered 2024-04-01: 40 ug via INTRAVENOUS

## 2024-04-01 MED ORDER — MENTHOL 3 MG MT LOZG: 1.0000 | LOZENGE | OROMUCOSAL | Status: DC | PRN

## 2024-04-01 MED ORDER — DOCUSATE SODIUM 100 MG PO CAPS
100.0000 mg | ORAL_CAPSULE | Freq: Two times a day (BID) | ORAL | Status: DC
  Administered 2024-04-01 – 2024-04-07 (×12): 100 mg via ORAL
  Filled 2024-04-01 (×14): qty 1

## 2024-04-01 MED ORDER — LACTATED RINGERS IV SOLN: INTRAVENOUS | Status: DC

## 2024-04-01 MED ORDER — VERAPAMIL HCL ER 180 MG PO TBCR
180.0000 mg | EXTENDED_RELEASE_TABLET | Freq: Two times a day (BID) | ORAL | Status: DC
  Administered 2024-04-01 – 2024-04-08 (×15): 180 mg via ORAL
  Filled 2024-04-01 (×15): qty 1

## 2024-04-01 MED ORDER — VENLAFAXINE HCL ER 37.5 MG PO CP24
37.5000 mg | ORAL_CAPSULE | Freq: Every day | ORAL | Status: DC
  Administered 2024-04-02 – 2024-04-08 (×8): 37.5 mg via ORAL
  Filled 2024-04-01 (×7): qty 1

## 2024-04-01 MED ORDER — ONDANSETRON HCL 4 MG/2ML IJ SOLN
INTRAMUSCULAR | Status: DC | PRN
  Administered 2024-04-01: 4 mg via INTRAVENOUS

## 2024-04-01 MED ORDER — OXYCODONE HCL 5 MG/5ML PO SOLN: 5.0000 mg | Freq: Once | ORAL | Status: DC | PRN

## 2024-04-01 MED ORDER — FENTANYL CITRATE PF 50 MCG/ML IJ SOSY
25.0000 ug | PREFILLED_SYRINGE | Freq: Once | INTRAMUSCULAR | Status: AC
  Administered 2024-04-01: 25 ug via INTRAVENOUS
  Filled 2024-04-01: qty 1

## 2024-04-01 MED ORDER — CEFAZOLIN SODIUM-DEXTROSE 2-4 GM/100ML-% IV SOLN
2.0000 g | INTRAVENOUS | Status: AC
  Administered 2024-04-01: 2 g via INTRAVENOUS

## 2024-04-01 MED ORDER — FENTANYL CITRATE (PF) 250 MCG/5ML IJ SOLN
INTRAMUSCULAR | Status: AC
  Filled 2024-04-01: qty 5

## 2024-04-01 MED ORDER — ACETAMINOPHEN 500 MG PO TABS: 1000.0000 mg | ORAL_TABLET | Freq: Three times a day (TID) | ORAL | Status: DC

## 2024-04-01 MED ORDER — ORAL CARE MOUTH RINSE: 15.0000 mL | Freq: Once | OROMUCOSAL | Status: AC

## 2024-04-01 MED ORDER — CEFAZOLIN SODIUM-DEXTROSE 2-4 GM/100ML-% IV SOLN
2.0000 g | Freq: Four times a day (QID) | INTRAVENOUS | Status: AC
  Administered 2024-04-01 – 2024-04-02 (×2): 2 g via INTRAVENOUS
  Filled 2024-04-01 (×2): qty 100

## 2024-04-01 MED ORDER — OXYCODONE HCL 5 MG PO TABS
5.0000 mg | ORAL_TABLET | ORAL | Status: DC | PRN
  Administered 2024-04-04 – 2024-04-07 (×4): 5 mg via ORAL
  Filled 2024-04-01 (×4): qty 1

## 2024-04-01 MED ORDER — ACETAMINOPHEN 500 MG PO TABS
1000.0000 mg | ORAL_TABLET | Freq: Three times a day (TID) | ORAL | Status: DC
  Administered 2024-04-01 – 2024-04-08 (×21): 1000 mg via ORAL
  Filled 2024-04-01 (×21): qty 2

## 2024-04-01 MED ORDER — ONDANSETRON HCL 4 MG PO TABS: 4.0000 mg | ORAL_TABLET | Freq: Four times a day (QID) | ORAL | Status: DC | PRN

## 2024-04-01 MED ORDER — PANTOPRAZOLE SODIUM 40 MG PO TBEC
40.0000 mg | DELAYED_RELEASE_TABLET | Freq: Every day | ORAL | Status: DC
  Administered 2024-04-01 – 2024-04-08 (×9): 40 mg via ORAL
  Filled 2024-04-01 (×8): qty 1

## 2024-04-01 MED ORDER — PROPOFOL 10 MG/ML IV BOLUS
INTRAVENOUS | Status: DC | PRN
  Administered 2024-04-01: 120 mg via INTRAVENOUS

## 2024-04-01 MED ORDER — FENTANYL CITRATE (PF) 250 MCG/5ML IJ SOLN
INTRAMUSCULAR | Status: DC | PRN
  Administered 2024-04-01: 50 ug via INTRAVENOUS
  Administered 2024-04-01 (×2): 25 ug via INTRAVENOUS

## 2024-04-01 MED ORDER — DEXAMETHASONE SODIUM PHOSPHATE 10 MG/ML IJ SOLN
INTRAMUSCULAR | Status: DC | PRN
  Administered 2024-04-01: 4 mg via INTRAVENOUS

## 2024-04-01 MED ORDER — METOCLOPRAMIDE HCL 5 MG PO TABS: 5.0000 mg | ORAL_TABLET | Freq: Three times a day (TID) | ORAL | Status: DC | PRN

## 2024-04-01 MED ORDER — CLOPIDOGREL BISULFATE 75 MG PO TABS
75.0000 mg | ORAL_TABLET | Freq: Every day | ORAL | Status: DC
  Administered 2024-04-02 – 2024-04-08 (×8): 75 mg via ORAL
  Filled 2024-04-01 (×7): qty 1

## 2024-04-01 MED ORDER — ONDANSETRON HCL 4 MG/2ML IJ SOLN: 4.0000 mg | Freq: Four times a day (QID) | INTRAMUSCULAR | Status: DC | PRN

## 2024-04-01 MED ORDER — HYDROMORPHONE HCL 1 MG/ML IJ SOLN: 0.2500 mg | INTRAMUSCULAR | Status: DC | PRN

## 2024-04-01 MED ORDER — ACETAMINOPHEN 325 MG PO TABS: 325.0000 mg | ORAL_TABLET | Freq: Four times a day (QID) | ORAL | Status: DC | PRN

## 2024-04-01 MED ORDER — CEFAZOLIN SODIUM-DEXTROSE 2-4 GM/100ML-% IV SOLN
INTRAVENOUS | Status: AC
  Filled 2024-04-01: qty 100

## 2024-04-01 MED ORDER — CHLORHEXIDINE GLUCONATE 0.12 % MT SOLN
OROMUCOSAL | Status: AC
  Administered 2024-04-01: 15 mL via OROMUCOSAL
  Filled 2024-04-01: qty 15

## 2024-04-01 MED ORDER — PHENYLEPHRINE HCL-NACL 20-0.9 MG/250ML-% IV SOLN
INTRAVENOUS | Status: DC | PRN
  Administered 2024-04-01: 20 ug/min via INTRAVENOUS

## 2024-04-01 MED ORDER — POLYETHYLENE GLYCOL 3350 17 G PO PACK
17.0000 g | PACK | Freq: Every day | ORAL | Status: DC
  Administered 2024-04-01 – 2024-04-07 (×7): 17 g via ORAL
  Filled 2024-04-01 (×7): qty 1

## 2024-04-01 MED ORDER — OXYCODONE HCL 5 MG PO TABS: 5.0000 mg | ORAL_TABLET | Freq: Once | ORAL | Status: DC | PRN

## 2024-04-01 MED ORDER — ATORVASTATIN CALCIUM 10 MG PO TABS
10.0000 mg | ORAL_TABLET | Freq: Every day | ORAL | Status: DC
  Administered 2024-04-01 – 2024-04-08 (×9): 10 mg via ORAL
  Filled 2024-04-01 (×8): qty 1

## 2024-04-01 MED ORDER — CHLORHEXIDINE GLUCONATE 0.12 % MT SOLN: 15.0000 mL | Freq: Once | OROMUCOSAL | Status: AC

## 2024-04-01 MED ORDER — METOCLOPRAMIDE HCL 5 MG/ML IJ SOLN: 5.0000 mg | Freq: Three times a day (TID) | INTRAMUSCULAR | Status: DC | PRN

## 2024-04-01 MED ORDER — DONEPEZIL HCL 5 MG PO TABS
5.0000 mg | ORAL_TABLET | Freq: Every day | ORAL | Status: DC
  Administered 2024-04-01 – 2024-04-07 (×7): 5 mg via ORAL
  Filled 2024-04-01 (×8): qty 1

## 2024-04-01 MED ORDER — TRANEXAMIC ACID-NACL 1000-0.7 MG/100ML-% IV SOLN
1000.0000 mg | Freq: Once | INTRAVENOUS | Status: AC
  Administered 2024-04-01: 1000 mg via INTRAVENOUS
  Filled 2024-04-01: qty 100

## 2024-04-01 MED ORDER — OXYCODONE HCL 5 MG PO TABS
2.5000 mg | ORAL_TABLET | ORAL | Status: DC | PRN
  Administered 2024-04-02: 2.5 mg via ORAL
  Filled 2024-04-01: qty 1

## 2024-04-01 MED ORDER — OXYCODONE HCL 5 MG PO TABS: 5.0000 mg | ORAL_TABLET | ORAL | Status: DC | PRN

## 2024-04-01 MED ORDER — DROPERIDOL 2.5 MG/ML IJ SOLN: 0.6250 mg | Freq: Once | INTRAMUSCULAR | Status: DC | PRN

## 2024-04-01 MED ORDER — FENTANYL CITRATE PF 50 MCG/ML IJ SOSY
25.0000 ug | PREFILLED_SYRINGE | Freq: Once | INTRAMUSCULAR | Status: DC | PRN
  Filled 2024-04-01: qty 1

## 2024-04-01 MED ORDER — POVIDONE-IODINE 10 % EX SWAB
2.0000 | Freq: Once | CUTANEOUS | Status: AC
  Administered 2024-04-01: 2 via TOPICAL

## 2024-04-01 MED ORDER — PHENOL 1.4 % MT LIQD: 1.0000 | OROMUCOSAL | Status: DC | PRN

## 2024-04-01 MED ORDER — LIDOCAINE 2% (20 MG/ML) 5 ML SYRINGE
INTRAMUSCULAR | Status: DC | PRN
  Administered 2024-04-01: 60 mg via INTRAVENOUS

## 2024-04-01 MED ORDER — MEMANTINE HCL 10 MG PO TABS
10.0000 mg | ORAL_TABLET | Freq: Two times a day (BID) | ORAL | Status: DC
Start: 2024-04-01 — End: 2024-04-08
  Administered 2024-04-01 – 2024-04-08 (×14): 10 mg via ORAL
  Filled 2024-04-01 (×16): qty 1

## 2024-04-01 MED ORDER — METHOCARBAMOL 500 MG PO TABS
500.0000 mg | ORAL_TABLET | Freq: Three times a day (TID) | ORAL | Status: DC
  Administered 2024-04-01 – 2024-04-08 (×21): 500 mg via ORAL
  Filled 2024-04-01 (×20): qty 1

## 2024-04-01 MED ORDER — QUETIAPINE FUMARATE 25 MG PO TABS
25.0000 mg | ORAL_TABLET | Freq: Every day | ORAL | Status: DC
  Administered 2024-04-02 – 2024-04-07 (×6): 25 mg via ORAL
  Filled 2024-04-01 (×7): qty 1

## 2024-04-01 MED ORDER — CHLORHEXIDINE GLUCONATE 4 % EX SOLN: 60.0000 mL | Freq: Once | CUTANEOUS | Status: DC

## 2024-04-01 MED ORDER — HYDROMORPHONE HCL 1 MG/ML IJ SOLN: 0.5000 mg | INTRAMUSCULAR | Status: DC | PRN

## 2024-04-01 SURGICAL SUPPLY — 1 items: NDL 22X1.5 STRL (OR ONLY) (MISCELLANEOUS) IMPLANT

## 2024-04-01 NOTE — ED Provider Notes (Signed)
 [facility] Provider Note    Event Date/Time   First MD Initiated Contact with Patient 04/01/24 [phone]     (approximate)   History   Fall   HPI  Melody Stone is a 73 year old female with history of Alzheimer's dementia, hypertension, renal artery stenosis on Plavix  presenting to the emergency department for evaluation after a fall.  Per staff, patient was found on the ground by her bed after an unwitnessed fall.  Unknown LOC.  Patient reportedly had buckling of her right leg when she initially tried to stand up, but not have obvious tenderness on exam with EMS.  EMS reports patient is nonverbal at baseline.  Reviewed ER visit from 03/22/2024.  At that time, patient presented after witnessed head trauma.  Had a forehead laceration that was closed with skin glue.  Negative CT head and C-spine at that time.      Physical Exam   Triage Vital Signs: ED Triage Vitals [04/01/24 0722]  Encounter Vitals Group     BP      Girls Systolic BP Percentile      Girls Diastolic BP Percentile      Boys Systolic BP Percentile      Boys Diastolic BP Percentile      Pulse      Resp      Temp      Temp src      SpO2      Weight 120 lb (54.4 kg)     Height 5' 4 (1.626 m)     Head Circumference      Peak Flow      Pain Score      Pain Loc      Pain Education      Exclude from Growth Chart     Most recent vital signs: Vitals:   04/01/24 1000 04/01/24 1006  BP: (!) 107/52 (!) 107/52  Pulse: 78 78  Resp: 18 18  Temp:  97.9 F (36.6 C)  SpO2: 99% 99%    Nursing notes and vital signs reviewed.  General: Adult female, laying in bed, awake Head: Healing abrasion over the right forehead, significantly improved from image from 03/22/2024 Chest: Symmetric chest rise, no appreciable tenderness to palpation.  Cardiac: Regular rhythm and rate.  Respiratory: Lungs clear to auscultation Abdomen: Soft, nondistended. No appreciable tenderness to palpation.  Pelvis:  Stable in AP and lateral compression. No appreciable tenderness to palpation, though patient does seem to preferentially lie on her left hip MSK: Resistance to straightening right leg, mild swelling of the right thigh compared to the contralateral side Neuro: Awake, minimal vocalization, does not take part in strength testing, but appears to be moving all extremities spontaneously with equal strength. Skin: No evidence of burns or lacerations.   ED Results / Procedures / Treatments   Labs (all labs ordered are listed, but only abnormal results are displayed) Labs Reviewed  CBC WITH DIFFERENTIAL/PLATELET - Abnormal; Notable for the following components:      Result Value   WBC 16.8 (*)    RBC 3.38 (*)    Hemoglobin 10.3 (*)    HCT 29.8 (*)    Neutro Abs 13.4 (*)    Monocytes Absolute 1.1 (*)    Abs Immature Granulocytes 0.10 (*)    All other components within normal limits  COMPREHENSIVE METABOLIC PANEL WITH GFR - Abnormal; Notable for the following components:   Glucose, Bld 105 (*)    All other components within  normal limits     EKG EKG independently reviewed and interpreted by myself demonstrates:  EKG demonstrates normal sinus rhythm rate of 75, PR 188, years 84, QTc 466, no acute ST changes  RADIOLOGY Imaging independently reviewed and interpreted by myself demonstrates:  CT head without acute bleed CT C-spine without acute fracture Pelvic x-Barnabas Henriques with questionable healed fracture versus acute fracture along the pubic rami Femur x-Zakaria Sedor demonstrates distal femur fracture.  Formal Radiology Read:  CT Cervical Spine Wo Contrast Result Date: 04/01/2024 CLINICAL DATA:  Neck trauma (Age >= 65y) EXAM: CT CERVICAL SPINE WITHOUT CONTRAST TECHNIQUE: Multidetector CT imaging of the cervical spine was performed without intravenous contrast. Multiplanar CT image reconstructions were also generated. RADIATION DOSE REDUCTION: This exam was performed according to the departmental  dose-optimization program which includes automated exposure control, adjustment of the mA and/or kV according to patient size and/or use of iterative reconstruction technique. COMPARISON:  March 22, 2024 FINDINGS: Alignment: Unchanged reversal of the normal cervical lordosis. No spondylolisthesis, uncovering of the facet joints, or significant widening of the spinous processes. No subluxation or abnormality identified at the craniovertebral junction. Skull base and vertebrae: Vertebral body heights are preserved. No acute fracture. No primary bone lesion or focal pathologic process.The lateral masses of C1 are well aligned with C2. The odontoid is intact. Soft tissues and spinal canal: No prevertebral edema or soft tissue thickening. No visible canal hematoma. Disc levels: Redemonstrated multilevel intervertebral disc height loss with bridging osteophyte formation involving C4-C5 through C7-T1. Similar severe degenerative changes on the right at C1-C2. Multilevel moderate facet arthropathy. Upper chest: Centrilobular emphysema. No focal airspace consolidation or pleural effusion. Other: None IMPRESSION: 1. No acute fracture or traumatic malalignment of the cervical spine. 2. Similarly appearing moderate multilevel degenerative disc disease of the spine. Electronically Signed   By: Rogelia Myers M.D.   On: 04/01/2024 08:48   CT Head Wo Contrast Result Date: 04/01/2024 CLINICAL DATA:  Head trauma, minor (Age >= 65y) EXAM: CT HEAD WITHOUT CONTRAST TECHNIQUE: Contiguous axial images were obtained from the base of the skull through the vertex without intravenous contrast. RADIATION DOSE REDUCTION: This exam was performed according to the departmental dose-optimization program which includes automated exposure control, adjustment of the mA and/or kV according to patient size and/or use of iterative reconstruction technique. COMPARISON:  March 22, 2024 FINDINGS: Motion degraded study. Brain: Chronic proportional  prominence of the ventricles and sulci, consistent with diffuse cerebral parenchymal volume loss. The ventricles otherwise maintained midline position without midline shift. Gray-white differentiation is preserved.Periventricular and subcortical white matter hypoattenuation, most consistent with changes of moderate chronic ischemic microvascular disease.No evidence of acute territorial infarction, extra-axial fluid collection, hemorrhage, or mass lesion. The basilar cisterns are patent without downward herniation. The cerebellar hemispheres and vermis are well formed without mass lesion or focal attenuation abnormality. Vascular: No hyperdense vessel. Skull: Normal. Negative for fracture or focal lesion. Sinuses/Orbits: The paranasal sinuses and mastoids are clear.The globes appear intact. No retrobulbar hematoma. Other: None. IMPRESSION: 1. No acute intracranial abnormality, specifically, no acute hemorrhage, territorial infarction, or intracranial mass. 2. Similar global cerebral volume loss with sequelae of advanced chronic ischemic microvascular disease. Electronically Signed   By: Rogelia Myers M.D.   On: 04/01/2024 08:41   DG Pelvis 1-2 Views Result Date: 04/01/2024 CLINICAL DATA:  [id number] Fall [id number] EXAM: PELVIS - 1-2 VIEW; RIGHT FEMUR 2 VIEWS COMPARISON:  Dec 28, 2021 FINDINGS: Pelvis Osteopenia.Cortical irregularity of the superior and inferior pubic rami adjacent to the symphyseal  pubis.No acute hip fracture or dislocation.There is no evidence of arthropathy or other focal bone abnormality.Soft tissues are unremarkable. Femur Mildly-comminuted, predominantly obliquely oriented, angulated, and displaced fracture of the distal femoral metaphysis. There is approximately 30 degrees of posterior angulation of the largest distal femoral fragment. This fragment is 1.6 cm displaced with 2.5 cm of bony overlap. There is no evidence of arthropathy or other focal bone abnormality. Soft tissues are unremarkable.  IMPRESSION: 1. Cortical irregularity of the parasymphyseal right superior and inferior pubic rami, which are in a region of fracture on the prior CT. These could represent healed fractures or nondisplaced acute fractures. Correlation with point tenderness recommended. 2. Mildly comminuted, angulated, and displaced fracture of the distal right femoral metaphysis. Electronically Signed   By: Rogelia Myers M.D.   On: 04/01/2024 08:24   DG FEMUR, MIN 2 VIEWS RIGHT Result Date: 04/01/2024 CLINICAL DATA:  [id number] Fall [id number] EXAM: PELVIS - 1-2 VIEW; RIGHT FEMUR 2 VIEWS COMPARISON:  Dec 28, 2021 FINDINGS: Pelvis Osteopenia.Cortical irregularity of the superior and inferior pubic rami adjacent to the symphyseal pubis.No acute hip fracture or dislocation.There is no evidence of arthropathy or other focal bone abnormality.Soft tissues are unremarkable. Femur Mildly-comminuted, predominantly obliquely oriented, angulated, and displaced fracture of the distal femoral metaphysis. There is approximately 30 degrees of posterior angulation of the largest distal femoral fragment. This fragment is 1.6 cm displaced with 2.5 cm of bony overlap. There is no evidence of arthropathy or other focal bone abnormality. Soft tissues are unremarkable. IMPRESSION: 1. Cortical irregularity of the parasymphyseal right superior and inferior pubic rami, which are in a region of fracture on the prior CT. These could represent healed fractures or nondisplaced acute fractures. Correlation with point tenderness recommended. 2. Mildly comminuted, angulated, and displaced fracture of the distal right femoral metaphysis. Electronically Signed   By: Rogelia Myers M.D.   On: 04/01/2024 08:24    PROCEDURES:  Critical Care performed: Yes, see critical care procedure note(s)  CRITICAL CARE Performed by: Nilsa Dade   Total critical care time: 32 minutes  Critical care time was exclusive of separately billable procedures and treating other  patients.  Critical care was necessary to treat or prevent imminent or life-threatening deterioration.  Critical care was time spent personally by me on the following activities: development of treatment plan with patient and/or surrogate as well as nursing, discussions with consultants, evaluation of patient's response to treatment, examination of patient, obtaining history from patient or surrogate, ordering and performing treatments and interventions, ordering and review of laboratory studies, ordering and review of radiographic studies, pulse oximetry and re-evaluation of patient's condition.   Procedures   MEDICATIONS ORDERED IN ED: Medications  fentaNYL  (SUBLIMAZE ) injection 25 mcg (25 mcg Intravenous Given 04/01/24 0829)     IMPRESSION / MDM / ASSESSMENT AND PLAN / ED COURSE  I reviewed the triage vital signs and the nursing notes.  Differential diagnosis includes, but is not limited to, intracranial bleed, cervical spine fracture, hip fracture or dislocation, soft tissue injury, arrhythmia, anemia, electrolyte abnormality  Patient's presentation is most consistent with acute presentation with potential threat to life or bodily function.  73 year old female presenting after an unwitnessed fall.  Cognitive deficit at baseline limits history.  No obvious area of focal tenderness on exam.  Will obtain CT head and C-spine as well as right hip and femur x-Dailan Pfalzgraf to further evaluate.  Will also obtain EKG and basic labs.  Labs with leukocytosis without obvious infection, possibly reactive in the  setting of recent fall, stable anemia.  CMP without significant abnormality.  X-rays of the right lower leg demonstrate a distal femur fracture.  Will discuss with orthopedics.    Clinical Course as of 04/01/24 1539  Mon Apr 01, 2024  [id number] Case reviewed with Dr. Tobie with orthopedics.  He reviewed the patient's imaging and recommends transfer to [facility] for Ortho trauma evaluation.  Will  contact transfer center. [NR]  [phone] Case reviewed with Ozell Ned, PA with [facility]. Reports he will review with his attending and provide update. [NR]  [phone] Received update from Ozell with [facility] Ortho team. Recommends transfer for OR today. Dr. Celena will operate on the patient, but requests hospitalist consult for admission. Also request knee immobilizer be placed. [NR]  1012 EMS here. Have not heard back from [facility] hospitalist yet. Discussed with Francis Mt, PA with [facility] who reported that the patient could be transferred with Dr. Celena as the accepting, but requests I speak with the [facility] hospitalist team to be primary for post-operative care.  [NR]  1020 Reviewed case with [facility] who reported they would page the [facility] hospitalist team. Patient has been transported in stable condition. [NR]  1147 I did not ever receive callback from [facility] to speak to hospitalist, but it does appear that patient has arrived at [facility]. Will likely need hospitalist consult there.  [NR]    Clinical Course User Index [NR] Levander Slate, MD     FINAL CLINICAL IMPRESSION(S) / ED DIAGNOSES   Final diagnoses:  Closed fracture of distal end of right femur, unspecified fracture morphology, initial encounter [facility])     Rx / DC Orders   ED Discharge Orders     None        Note:  This document was prepared using Dragon voice recognition software and may include unintentional dictation errors.   Levander Slate, MD 04/01/24 1539

## 2024-04-01 NOTE — ED Notes (Signed)
 Called [facility] for possible transfer, per Dr. Levander

## 2024-04-01 NOTE — ED Triage Notes (Addendum)
 Pt to ED [facility] from [facility]. Pt either rolled out of bed, or stood up and fell (fall was unwitnessed). Both hips nontender to palpation per EMS.No thinners, unsure LOC. 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   EMS VS:  CBG 121, T 98, HR 71, 98% RA, 102/54. No EKG  Pupils are equal and pinpoint.

## 2024-04-01 NOTE — Anesthesia Procedure Notes (Signed)
 Procedure Name: Intubation Date/Time: 04/01/2024 2:22 PM  Performed by: Roslynn Waddell LABOR, CRNAPre-anesthesia Checklist: Patient identified, Emergency Drugs available, Suction available and Patient being monitored Patient Re-evaluated:Patient Re-evaluated prior to induction Oxygen Delivery Method: Circle System Utilized Preoxygenation: Pre-oxygenation with 100% oxygen Induction Type: IV induction Ventilation: Mask ventilation without difficulty Laryngoscope Size: Mac and 3 Grade View: Grade II Tube type: Oral Number of attempts: 1 Airway Equipment and Method: Stylet and Oral airway Placement Confirmation: ETT inserted through vocal cords under direct vision, positive ETCO2 and breath sounds checked- equal and bilateral Secured at: 21 cm Tube secured with: Tape Dental Injury: Teeth and Oropharynx as per pre-operative assessment  Comments: Initially, MDA attempted to place LMA but surgeon requested ETT with NMB. Some very mild oropharyngeal bleeding from LMA attempt. MDA pulled LMA and CRNA mask ventilated while MDA pushed NMB. CRNA DL x1 with Mac 3 blade and esophageal intubation (grade 2b view). MDA DL x1 with Mac 3 blade and successful intubation (verbalized grade 2 view).

## 2024-04-01 NOTE — Consult Note (Signed)
 Reason for Consult:Right distal femur fx Referring Physician: Nilsa Stone Time called: 1113 Time at bedside: 1130   Melody Stone is an 73 y.o. female.  HPI: Melody Stone had an unwitnessed fall at the group home where she lives. She was brought to the ED at [facility] where x-rays showed a distal femur fx. Orthopedic surgery was consulted but felt it out of their scope and requested orthopedic trauma consultation. She was transferred to [facility] for definitive care. She is severely demented and cannot contribute to history.  Past Medical History:  Diagnosis Date   Anemia    Anxiety    Aortic insufficiency    a. 10/2015 Echo: Nl LV fxn, mild to moderate AI; b. 02/2017 Echo: EF 60-65%, no rwma, mild AI.   Autistic disorder    nonverbal at baseline   Cataracts, bilateral    Chest pain    a. 10/2015 Ex MV Avelina): EF 69%, no ischemia; b. 01/2017 Lexiscan  MV: EF 45%, no ischemia/infarct-->Low risk.   Dementia (HCC)    Dyspnea on exertion    Esophagitis    Hepatitis C    HLD (hyperlipidemia)    Hypertension    Intermittent explosive disorder    Mental retardation    Panic disorder    Reflux esophagitis    Renal failure     Past Surgical History:  Procedure Laterality Date   ABDOMINAL HYSTERECTOMY     APPENDECTOMY     ESOPHAGOGASTRODUODENOSCOPY (EGD) WITH PROPOFOL  N/A 02/07/2023   Procedure: ESOPHAGOGASTRODUODENOSCOPY (EGD) WITH PROPOFOL ;  Surgeon: Melody Bi, MD;  Location: [facility] ENDOSCOPY;  Service: Gastroenterology;  Laterality: N/A;  Melody Stone - BROTHER IS LEGAL GUARDIAN WILL GIVE VERBAL CONSENT ON PHONE # [phone] PATIENT IS IN [facility]   RENAL ANGIOGRAPHY N/A 12/15/2022   Procedure: RENAL ANGIOGRAPHY;  Surgeon: Melody Selinda RAMAN, MD;  Location: [facility] INVASIVE CV LAB;  Service: Cardiovascular;  Laterality: N/A;    Family History  Problem Relation Age of Onset   Heart attack Mother        CABG in her 44's   Cancer Mother    CAD Brother        94   Hypertension Brother         68    Social History:  reports that she has never smoked. She has never used smokeless tobacco. She reports that she does not drink alcohol and does not use drugs.  Allergies:  Allergies  Allergen Reactions   Other Other (See Comments)    Psychotropic drugs cause kidney failure. Psychotropic drug cause renal failure. Psychotropic drugs cause kidney failure.   Ciprofloxacin Rash    Per caregiver.    Medications: I have reviewed the patient's current medications.  Results for orders placed or performed during the hospital encounter of 04/01/24 (from the past 48 hours)  CBC with Differential     Status: Abnormal   Collection Time: 04/01/24  7:36 AM  Result Value Ref Range   WBC 16.8 (H) 4.0 - 10.5 K/uL   RBC 3.38 (L) 3.87 - 5.11 MIL/uL   Hemoglobin 10.3 (L) 12.0 - 15.0 g/dL   HCT 70.1 (L) 63.9 - 53.9 %   MCV 88.2 80.0 - 100.0 fL   MCH 30.5 26.0 - 34.0 pg   MCHC 34.6 30.0 - 36.0 g/dL   RDW 86.2 88.4 - 84.4 %   Platelets 247 150 - 400 K/uL   nRBC 0.0 0.0 - 0.2 %   Neutrophils Relative % 78 %  Neutro Abs 13.4 (H) 1.7 - 7.7 K/uL   Lymphocytes Relative 12 %   Lymphs Abs 2.0 0.7 - 4.0 K/uL   Monocytes Relative 7 %   Monocytes Absolute 1.1 (H) 0.1 - 1.0 K/uL   Eosinophils Relative 1 %   Eosinophils Absolute 0.1 0.0 - 0.5 K/uL   Basophils Relative 1 %   Basophils Absolute 0.1 0.0 - 0.1 K/uL   Immature Granulocytes 1 %   Abs Immature Granulocytes 0.10 (H) 0.00 - 0.07 K/uL    Comment: Performed at [facility], [address] [place], [place] [address]  Comprehensive metabolic panel     Status: Abnormal   Collection Time: 04/01/24  7:36 AM  Result Value Ref Range   Sodium 140 135 - 145 mmol/L   Potassium 4.1 3.5 - 5.1 mmol/L   Chloride 103 98 - 111 mmol/L   CO2 23 22 - 32 mmol/L   Glucose, Bld 105 (H) 70 - 99 mg/dL    Comment: Glucose reference range applies only to samples taken after fasting for at least 8 hours.   BUN 21 8 - 23 mg/dL   Creatinine, Ser  9.34 0.44 - 1.00 mg/dL   Calcium  9.5 8.9 - 10.3 mg/dL   Total Protein 6.5 6.5 - 8.1 g/dL   Albumin 3.5 3.5 - 5.0 g/dL   AST 23 15 - 41 U/L   ALT 16 0 - 44 U/L   Alkaline Phosphatase 90 38 - 126 U/L   Total Bilirubin 1.0 0.0 - 1.2 mg/dL   GFR, Estimated >39 >39 mL/min    Comment: (NOTE) Calculated using the CKD-EPI Creatinine Equation (2021)    Anion gap 14 5 - 15    Comment: Performed at [facility], [address] [place], [place] [address]    CT Cervical Spine Wo Contrast Result Date: 04/01/2024 CLINICAL DATA:  Neck trauma (Age >= 65y) EXAM: CT CERVICAL SPINE WITHOUT CONTRAST TECHNIQUE: Multidetector CT imaging of the cervical spine was performed without intravenous contrast. Multiplanar CT image reconstructions were also generated. RADIATION DOSE REDUCTION: This exam was performed according to the departmental dose-optimization program which includes automated exposure control, adjustment of the mA and/or kV according to patient size and/or use of iterative reconstruction technique. COMPARISON:  March 22, 2024 FINDINGS: Alignment: Unchanged reversal of the normal cervical lordosis. No spondylolisthesis, uncovering of the facet joints, or significant widening of the spinous processes. No subluxation or abnormality identified at the craniovertebral junction. Skull base and vertebrae: Vertebral body heights are preserved. No acute fracture. No primary bone lesion or focal pathologic process.The lateral masses of C1 are well aligned with C2. The odontoid is intact. Soft tissues and spinal canal: No prevertebral edema or soft tissue thickening. No visible canal hematoma. Disc levels: Redemonstrated multilevel intervertebral disc height loss with bridging osteophyte formation involving C4-C5 through C7-T1. Similar severe degenerative changes on the right at C1-C2. Multilevel moderate facet arthropathy. Upper chest: Centrilobular emphysema. No focal airspace consolidation or pleural effusion.  Other: None IMPRESSION: 1. No acute fracture or traumatic malalignment of the cervical spine. 2. Similarly appearing moderate multilevel degenerative disc disease of the spine. Electronically Signed   By: Rogelia Myers M.D.   On: 04/01/2024 08:48   CT Head Wo Contrast Result Date: 04/01/2024 CLINICAL DATA:  Head trauma, minor (Age >= 65y) EXAM: CT HEAD WITHOUT CONTRAST TECHNIQUE: Contiguous axial images were obtained from the base of the skull through the vertex without intravenous contrast. RADIATION DOSE REDUCTION: This exam was performed  according to the departmental dose-optimization program which includes automated exposure control, adjustment of the mA and/or kV according to patient size and/or use of iterative reconstruction technique. COMPARISON:  March 22, 2024 FINDINGS: Motion degraded study. Brain: Chronic proportional prominence of the ventricles and sulci, consistent with diffuse cerebral parenchymal volume loss. The ventricles otherwise maintained midline position without midline shift. Gray-white differentiation is preserved.Periventricular and subcortical white matter hypoattenuation, most consistent with changes of moderate chronic ischemic microvascular disease.No evidence of acute territorial infarction, extra-axial fluid collection, hemorrhage, or mass lesion. The basilar cisterns are patent without downward herniation. The cerebellar hemispheres and vermis are well formed without mass lesion or focal attenuation abnormality. Vascular: No hyperdense vessel. Skull: Normal. Negative for fracture or focal lesion. Sinuses/Orbits: The paranasal sinuses and mastoids are clear.The globes appear intact. No retrobulbar hematoma. Other: None. IMPRESSION: 1. No acute intracranial abnormality, specifically, no acute hemorrhage, territorial infarction, or intracranial mass. 2. Similar global cerebral volume loss with sequelae of advanced chronic ischemic microvascular disease. Electronically Signed   By:  Rogelia Myers M.D.   On: 04/01/2024 08:41   DG Pelvis 1-2 Views Result Date: 04/01/2024 CLINICAL DATA:  [id number] Fall [id number] EXAM: PELVIS - 1-2 VIEW; RIGHT FEMUR 2 VIEWS COMPARISON:  Dec 28, 2021 FINDINGS: Pelvis Osteopenia.Cortical irregularity of the superior and inferior pubic rami adjacent to the symphyseal pubis.No acute hip fracture or dislocation.There is no evidence of arthropathy or other focal bone abnormality.Soft tissues are unremarkable. Femur Mildly-comminuted, predominantly obliquely oriented, angulated, and displaced fracture of the distal femoral metaphysis. There is approximately 30 degrees of posterior angulation of the largest distal femoral fragment. This fragment is 1.6 cm displaced with 2.5 cm of bony overlap. There is no evidence of arthropathy or other focal bone abnormality. Soft tissues are unremarkable. IMPRESSION: 1. Cortical irregularity of the parasymphyseal right superior and inferior pubic rami, which are in a region of fracture on the prior CT. These could represent healed fractures or nondisplaced acute fractures. Correlation with point tenderness recommended. 2. Mildly comminuted, angulated, and displaced fracture of the distal right femoral metaphysis. Electronically Signed   By: Rogelia Myers M.D.   On: 04/01/2024 08:24   DG FEMUR, MIN 2 VIEWS RIGHT Result Date: 04/01/2024 CLINICAL DATA:  [id number] Fall [id number] EXAM: PELVIS - 1-2 VIEW; RIGHT FEMUR 2 VIEWS COMPARISON:  Dec 28, 2021 FINDINGS: Pelvis Osteopenia.Cortical irregularity of the superior and inferior pubic rami adjacent to the symphyseal pubis.No acute hip fracture or dislocation.There is no evidence of arthropathy or other focal bone abnormality.Soft tissues are unremarkable. Femur Mildly-comminuted, predominantly obliquely oriented, angulated, and displaced fracture of the distal femoral metaphysis. There is approximately 30 degrees of posterior angulation of the largest distal femoral fragment. This fragment is 1.6 cm  displaced with 2.5 cm of bony overlap. There is no evidence of arthropathy or other focal bone abnormality. Soft tissues are unremarkable. IMPRESSION: 1. Cortical irregularity of the parasymphyseal right superior and inferior pubic rami, which are in a region of fracture on the prior CT. These could represent healed fractures or nondisplaced acute fractures. Correlation with point tenderness recommended. 2. Mildly comminuted, angulated, and displaced fracture of the distal right femoral metaphysis. Electronically Signed   By: Rogelia Myers M.D.   On: 04/01/2024 08:24    Review of Systems  Unable to perform ROS: Dementia   Blood pressure 123/74, pulse 83, temperature 98.1 F (36.7 C), temperature source Oral, resp. rate 18, height 5' 4 (1.626 m), weight 54.4 kg, SpO2 100%. Physical Exam Constitutional:  General: She is not in acute distress.    Appearance: She is well-developed. She is not diaphoretic.  HENT:     Head: Normocephalic and atraumatic.  Eyes:     General: No scleral icterus.       Right eye: No discharge.        Left eye: No discharge.     Conjunctiva/sclera: Conjunctivae normal.  Cardiovascular:     Rate and Rhythm: Normal rate and regular rhythm.  Pulmonary:     Effort: Pulmonary effort is normal. No respiratory distress.  Musculoskeletal:     Cervical back: Normal range of motion.     Comments: RLE No traumatic wounds, ecchymosis, or rash  KI in place  No ankle effusion  Sens DPN, SPN, TN could not assess  Motor EHL, ext, flex, evers grossly intact  DP 1+, No significant edema  Skin:    General: Skin is warm and dry.  Neurological:     Mental Status: She is alert.  Psychiatric:     Comments: Nearly non-verbal     Assessment/Plan: Right distal femur fx -- Plan ORIF today with Dr. Celena. Please keep NPO.    Ozell DOROTHA Ned, PA-C Orthopedic Surgery [phone] 04/01/2024, 11:33 AM

## 2024-04-01 NOTE — ED Notes (Signed)
 Caregiver at bedside. EDP informed.

## 2024-04-01 NOTE — ED Notes (Signed)
 Bed alarm and nonslip socks placed. Door left open.

## 2024-04-01 NOTE — ED Notes (Signed)
 Pt accepted to [facility] Short Stay per Debby, coordinator. Call report to [phone]. Accepting is Dr. Celena.

## 2024-04-01 NOTE — H&P (Signed)
 History and Physical    Patient: Melody Stone FMW:[id number] DOB: Nov 08, 1950 DOA: 04/01/2024 DOS: the patient was seen and examined on 04/01/2024 PCP: Cleotilde Oneil FALCON, MD  Patient coming from: Home  Chief Complaint: No chief complaint on file.  HPI: Melody Stone is a 73 y.o. female with medical history significant of dementia, cognitive delay, hepatitis C, HTN, HLD, GERD, dysphagia, aortic insufficiency, chronic anemia, frequent falls p/w GLF at ALF c/b R hip fracture.  Pt unable to provide medical history as she is nonverbal per report from bedside RN. Per discussion with orthopedic surgeon, pt transferred from [facility] after GLF at ALF and had R hip fracture which was repaired on 8/4.  In the PACU, pt AFVSS. Labs WBC 16.8. Vascular surgery requested medical admission.  Review of Systems: As mentioned in the history of present illness. All other systems reviewed and are negative. Past Medical History:  Diagnosis Date   Anemia    Anxiety    Aortic insufficiency    a. 10/2015 Echo: Nl LV fxn, mild to moderate AI; b. 02/2017 Echo: EF 60-65%, no rwma, mild AI.   Autistic disorder    nonverbal at baseline   Cataracts, bilateral    Chest pain    a. 10/2015 Ex MV Avelina): EF 69%, no ischemia; b. 01/2017 Lexiscan  MV: EF 45%, no ischemia/infarct-->Low risk.   Dementia (HCC)    Dyspnea on exertion    Esophagitis    Hepatitis C    HLD (hyperlipidemia)    Hypertension    Intermittent explosive disorder    Mental retardation    Panic disorder    Reflux esophagitis    Renal failure    Past Surgical History:  Procedure Laterality Date   ABDOMINAL HYSTERECTOMY     APPENDECTOMY     ESOPHAGOGASTRODUODENOSCOPY (EGD) WITH PROPOFOL  N/A 02/07/2023   Procedure: ESOPHAGOGASTRODUODENOSCOPY (EGD) WITH PROPOFOL ;  Surgeon: Therisa Bi, MD;  Location: [facility] ENDOSCOPY;  Service: Gastroenterology;  Laterality: N/A;  [name] Ludke - BROTHER IS LEGAL GUARDIAN WILL GIVE VERBAL CONSENT ON PHONE #  [phone] PATIENT IS IN [facility]   RENAL ANGIOGRAPHY N/A 12/15/2022   Procedure: RENAL ANGIOGRAPHY;  Surgeon: Marea Selinda RAMAN, MD;  Location: [facility] INVASIVE CV LAB;  Service: Cardiovascular;  Laterality: N/A;   Social History:  reports that she has never smoked. She has never used smokeless tobacco. She reports that she does not drink alcohol and does not use drugs.  Allergies  Allergen Reactions   Other Other (See Comments)    Psychotropic drugs cause kidney failure. Psychotropic drug cause renal failure. Psychotropic drugs cause kidney failure.   Ciprofloxacin Rash    Per caregiver.    Family History  Problem Relation Age of Onset   Heart attack Mother        CABG in her 59's   Cancer Mother    CAD Brother        68   Hypertension Brother        2    Prior to Admission medications   Medication Sig Start Date End Date Taking? Authorizing Provider  clopidogrel  (PLAVIX ) 75 MG tablet Take 1 tablet (75 mg total) by mouth daily. 12/15/22  Yes Dew, Selinda RAMAN, MD  Aloe Vera OIL by Does not apply route. Cream    [provider]  atorvastatin  (LIPITOR) 10 MG tablet Take 1 tablet (10 mg total) by mouth daily. 12/15/22 12/15/23  Marea Selinda RAMAN, MD  B Complex-C-Folic Acid (B-COMPLEX/FOLIC ACID/VITAMIN C) TBCR Take  1 tablet by mouth daily. 07/21/21   [provider]  Brimonidine Tartrate (LUMIFY) 0.025 % SOLN Apply to eye. Patient not taking: Reported on 11/14/2023    [provider]  chlorhexidine  (PERIDEX ) 0.12 % solution SMARTSIG:By Mouth 12/02/21   [provider]  Cholecalciferol (VITAMIN D3) 1000 units CAPS Take 2,000 Units by mouth daily.     [provider]  clonazePAM (KLONOPIN) 0.5 MG tablet Take 0.25-0.5 mg by mouth See admin instructions. Take  tablet (0.25mg ) by mouth every morning and 1 tablet (1mg ) by mouth every evening    [provider]  docusate sodium  (COLACE) 100 MG capsule Take 200 mg by mouth daily.    [provider]  donepezil  (ARICEPT ) 5 MG tablet Take 5 mg by mouth daily. 02/28/20   [provider]  gentamicin (GARAMYCIN) 0.3 % ophthalmic solution Place 1 drop into the left eye 3 (three) times daily. Patient not taking: Reported on 11/14/2023 01/02/23   [provider]  ibuprofen (ADVIL,MOTRIN) 200 MG tablet Take 600 mg by mouth 2 (two) times daily. Patient not taking: Reported on 01/26/2023    [provider]  loratadine (CLARITIN) 10 MG tablet Take 10 mg by mouth daily.    [provider]  losartan  (COZAAR ) 50 MG tablet Take 1 tablet by mouth daily. Patient not taking: Reported on 01/26/2023 12/20/20   [provider]  memantine  (NAMENDA ) 10 MG tablet Take 10 mg by mouth 2 (two) times daily. 02/21/20   [provider]  Olopatadine HCl 0.2 % SOLN Place 1 drop into both eyes daily. Patient not taking: Reported on 04/17/2023    [provider]  pantoprazole  (PROTONIX ) 40 MG tablet Take 1 tablet (40 mg total) by mouth daily for 14 days. 11/01/22   Ernest Ronal BRAVO, MD  polyethylene glycol (MIRALAX ) 17 g packet Take 17 g by mouth daily. Patient not taking: Reported on 04/17/2023 02/04/21   Rodgers, Caitlin J, PA  QUEtiapine  (SEROQUEL ) 25 MG tablet Take 25 mg by mouth at bedtime.    [provider]  senna (SENOKOT) 8.6 MG tablet Take 1 tablet by mouth daily.    [provider]  SYSTANE BALANCE 0.6 % SOLN Apply 1 drop to eye 4 (four) times daily. Patient not taking: Reported on 04/17/2023 11/24/21   [provider]  triamcinolone cream (KENALOG) 0.1 % Apply 1 Application topically as needed.    [provider]  venlafaxine  XR (EFFEXOR -XR) 37.5 MG 24 hr capsule Take 37.5 mg by mouth daily. 02/21/20   [provider]  verapamil  (CALAN -SR) 180 MG CR tablet Take 180 mg by mouth 2 (two) times daily.     [provider]  PARoxetine (PAXIL) 30 MG tablet Take 30 mg by mouth at bedtime.   04/14/20  [provider]    Physical Exam: Vitals:   04/01/24 1053  BP: 123/74  Pulse: 83  Resp: 18  Temp: 98.1 F (36.7 C)  TempSrc: Oral  SpO2: 100%  Weight: 54.4 kg  Height: 5' 4 (1.626 m)   General: Alert, oriented x3, resting comfortably in no acute distress Respiratory: Lungs clear to auscultation bilaterally with normal respiratory effort; no w/r/r Cardiovascular: Regular rate and rhythm w/o m/r/g   Data Reviewed:  Lab Results  Component Value Date   WBC 16.8 (H) 04/01/2024   HGB 10.3 (L) 04/01/2024   HCT 29.8 (L) 04/01/2024   MCV 88.2 04/01/2024   PLT 247 04/01/2024   Lab Results  Component Value Date   GLUCOSE 105 (H) 04/01/2024   CALCIUM  9.5 04/01/2024   NA 140 04/01/2024   K 4.1 04/01/2024   CO2 23 04/01/2024   CL 103 04/01/2024   BUN 21 04/01/2024   CREATININE 0.65 04/01/2024   Lab Results  Component Value Date   ALT 16 04/01/2024   AST 23 04/01/2024   ALKPHOS 90 04/01/2024   BILITOT 1.0 04/01/2024   Lab Results  Component Value Date   INR 0.9 02/04/2021    Radiology: DG C-Arm 1-60 Min-No Report Result Date: 04/01/2024 Fluoroscopy was utilized by the requesting physician.  No radiographic interpretation.   DG C-Arm 1-60 Min-No Report Result Date: 04/01/2024 Fluoroscopy was utilized by the requesting physician.  No radiographic interpretation.   CT Cervical Spine Wo Contrast Result Date: 04/01/2024 CLINICAL DATA:  Neck trauma (Age >= 65y) EXAM: CT CERVICAL SPINE WITHOUT CONTRAST TECHNIQUE: Multidetector CT imaging of the cervical spine was performed without intravenous contrast. Multiplanar CT image reconstructions were also generated. RADIATION DOSE REDUCTION: This exam was performed according to the departmental dose-optimization program which includes automated exposure control, adjustment of the mA and/or kV according to patient size and/or use of iterative reconstruction technique. COMPARISON:  March 22, 2024 FINDINGS: Alignment: Unchanged reversal of  the normal cervical lordosis. No spondylolisthesis, uncovering of the facet joints, or significant widening of the spinous processes. No subluxation or abnormality identified at the craniovertebral junction. Skull base and vertebrae: Vertebral body heights are preserved. No acute fracture. No primary bone lesion or focal pathologic process.The lateral masses of C1 are well aligned with C2. The odontoid is intact. Soft tissues and spinal canal: No prevertebral edema or soft tissue thickening. No visible canal hematoma. Disc levels: Redemonstrated multilevel intervertebral disc height loss with bridging osteophyte formation involving C4-C5 through C7-T1. Similar severe degenerative changes on the right at C1-C2. Multilevel moderate facet arthropathy. Upper chest: Centrilobular emphysema. No focal airspace consolidation or pleural effusion. Other: None IMPRESSION: 1. No acute fracture or traumatic malalignment of the cervical spine. 2. Similarly appearing moderate multilevel degenerative disc disease of the spine. Electronically Signed   By: Rogelia Myers M.D.   On: 04/01/2024 08:48   CT Head Wo Contrast Result Date: 04/01/2024 CLINICAL DATA:  Head trauma, minor (Age >= 65y) EXAM: CT HEAD WITHOUT CONTRAST TECHNIQUE: Contiguous axial images were obtained from the base of the skull through the vertex without intravenous contrast. RADIATION DOSE REDUCTION: This exam was performed according to the departmental dose-optimization program which includes automated exposure control, adjustment of the mA and/or kV according to patient size and/or use of iterative reconstruction technique. COMPARISON:  March 22, 2024 FINDINGS: Motion degraded study. Brain: Chronic proportional prominence of the ventricles and sulci, consistent with diffuse cerebral parenchymal volume loss. The ventricles otherwise maintained midline position without midline shift. Gray-white differentiation is preserved.Periventricular and subcortical white  matter hypoattenuation, most consistent with changes of moderate chronic ischemic microvascular disease.No evidence of acute territorial infarction, extra-axial fluid collection, hemorrhage, or mass lesion. The basilar cisterns are patent without downward herniation. The cerebellar hemispheres and vermis are well formed without mass lesion or focal attenuation abnormality. Vascular: No hyperdense vessel. Skull: Normal. Negative for fracture or focal lesion. Sinuses/Orbits: The paranasal sinuses and mastoids are clear.The globes appear intact. No retrobulbar hematoma. Other: None. IMPRESSION: 1. No acute intracranial abnormality, specifically, no acute hemorrhage, territorial infarction, or intracranial mass. 2. Similar global cerebral volume loss with sequelae of advanced chronic ischemic microvascular disease. Electronically Signed   By: Rogelia  Carlean M.D.   On: 04/01/2024 08:41   DG Pelvis 1-2 Views Result Date: 04/01/2024 CLINICAL DATA:  [id number] Fall [id number] EXAM: PELVIS - 1-2 VIEW; RIGHT FEMUR 2 VIEWS COMPARISON:  Dec 28, 2021 FINDINGS: Pelvis Osteopenia.Cortical irregularity of the superior and inferior pubic rami adjacent to the symphyseal pubis.No acute hip fracture or dislocation.There is no evidence of arthropathy or other focal bone abnormality.Soft tissues are unremarkable. Femur Mildly-comminuted, predominantly obliquely oriented, angulated, and displaced fracture of the distal femoral metaphysis. There is approximately 30 degrees of posterior angulation of the largest distal femoral fragment. This fragment is 1.6 cm displaced with 2.5 cm of bony overlap. There is no evidence of arthropathy or other focal bone abnormality. Soft tissues are unremarkable. IMPRESSION: 1. Cortical irregularity of the parasymphyseal right superior and inferior pubic rami, which are in a region of fracture on the prior CT. These could represent healed fractures or nondisplaced acute fractures. Correlation with point  tenderness recommended. 2. Mildly comminuted, angulated, and displaced fracture of the distal right femoral metaphysis. Electronically Signed   By: Rogelia Carlean M.D.   On: 04/01/2024 08:24   DG FEMUR, MIN 2 VIEWS RIGHT Result Date: 04/01/2024 CLINICAL DATA:  [id number] Fall [id number] EXAM: PELVIS - 1-2 VIEW; RIGHT FEMUR 2 VIEWS COMPARISON:  Dec 28, 2021 FINDINGS: Pelvis Osteopenia.Cortical irregularity of the superior and inferior pubic rami adjacent to the symphyseal pubis.No acute hip fracture or dislocation.There is no evidence of arthropathy or other focal bone abnormality.Soft tissues are unremarkable. Femur Mildly-comminuted, predominantly obliquely oriented, angulated, and displaced fracture of the distal femoral metaphysis. There is approximately 30 degrees of posterior angulation of the largest distal femoral fragment. This fragment is 1.6 cm displaced with 2.5 cm of bony overlap. There is no evidence of arthropathy or other focal bone abnormality. Soft tissues are unremarkable. IMPRESSION: 1. Cortical irregularity of the parasymphyseal right superior and inferior pubic rami, which are in a region of fracture on the prior CT. These could represent healed fractures or nondisplaced acute fractures. Correlation with point tenderness recommended. 2. Mildly comminuted, angulated, and displaced fracture of the distal right femoral metaphysis. Electronically Signed   By: Rogelia Carlean M.D.   On: 04/01/2024 08:24    Assessment and Plan: 21F h/o dementia, cognitive delay, hepatitis C, HTN, HLD, GERD, dysphagia, aortic insufficiency, chronic anemia, frequent falls p/w GLF at ALF c/b R hip fracture.  R hip fracture GLF -PT/OT consulted; apprec eval/recs -Ortho surgery consulted; apprec eval/recs -Resume pta plavix  75mg  daily per ortho on 8/5 -Multimodal pain control w/ tylenol  1g TID, robaxin  500mg  TID, and oxycodone  5mg  q4h prn -PTA senna-docudsate and miralax   GERD -PTA pantoprazole  40mg   daily  HLD -PTA atorvastatin  10mg  daily  H/o dementia -PTA Aricept  and Namenda    Advance Care Planning:   Code Status: Full Code   Consults: Ortho  Family Communication: N/A  Severity of Illness: The appropriate patient status for this patient is INPATIENT. Inpatient status is judged to be reasonable and necessary in order to provide the required intensity of service to ensure the patient's safety. The patient's presenting symptoms, physical exam findings, and initial radiographic and laboratory data in the context of their chronic comorbidities is felt to place them at high risk for further clinical deterioration. Furthermore, it is not anticipated that the patient will be medically stable for discharge from the hospital within 2 midnights of admission.   * I certify that at the point of admission it is my clinical judgment that the patient will require inpatient  hospital care spanning beyond 2 midnights from the point of admission due to high intensity of service, high risk for further deterioration and high frequency of surveillance required.*   ------- I spent 57 minutes reviewing previous labs/notes, obtaining separate history at the bedside, counseling/discussing the treatment plan outlined above, ordering medications/tests, and performing clinical documentation.  Author: Marsha Ada, MD 04/01/2024 4:18 PM  For on call review [email or web address].

## 2024-04-01 NOTE — Transfer of Care (Signed)
 Immediate Anesthesia Transfer of Care Note  Patient: Melody Stone  Procedure(s) Performed: OPEN REDUCTION INTERNAL FIXATION (ORIF) DISTAL FEMUR FRACTURE (Right)  Patient Location: PACU  Anesthesia Type:General  Level of Consciousness: awake  Airway & Oxygen Therapy: Patient Spontanous Breathing and Patient connected to face mask oxygen  Post-op Assessment: Report given to RN and Post -op Vital signs reviewed and stable  Post vital signs: Reviewed and stable  Last Vitals:  Vitals Value Taken Time  BP 140/65 04/01/24 16:09  Temp    Pulse 73 04/01/24 16:10  Resp 15 04/01/24 16:11  SpO2 100 % 04/01/24 16:10  Vitals shown include unfiled device data.  Last Pain:  Vitals:   04/01/24 1053  TempSrc: Oral         Complications: No notable events documented.

## 2024-04-01 NOTE — Op Note (Signed)
 04/01/2024  9:40 PM  PATIENT:  Melody Stone  04/25/1951 female   MEDICAL RECORD NUMBER: [id number]  PRE-OPERATIVE DIAGNOSIS:  Right Distal Femur Fracture   POST-OPERATIVE DIAGNOSIS:  Right Distal Femur Fracture  PROCEDURE:   OPEN REDUCTION INTERNAL FIXATION OF RIGHT DISTAL FEMUR WITHOUT INTERCONDYLAR EXTENSION MANUAL APPLICATION OF STRESS UNDER FLUOROSCOPY RIGHT KNEE   SURGEON:  Ozell DEL. Celena, M.D.  ASSISTANT:  Francis Mt, PA-C.  ANESTHESIA:  General.  COMPLICATIONS:  None.  TOURNIQUET: None.  ESTIMATED BLOOD LOSS:  100 mL.  DISPOSITION:  To PACU.  CONDITION:  Stable.  DELAY START OF DVT PROPHYLAXIS BECAUSE OF BLEEDING RISK: NO  BRIEF SUMMARY OF INDICATION FOR PROCEDURE:  Melody Stone is a pleasant, nonverbal 73 y.o. who sustained knee injury in an unwitnessed fall at her group home. The patient presents for definitive repair of this comminuted supracondylar femur fracture with intercondylar extension.  I did discuss with both patient and family the risks and benefits of surgery including the possibility of infection, nerve injury, vessel injury, DVT/ PE, loss of motion, arthritis, symptomatic hardware, malunion, nonunion, and need for further surgery among others.  After full discussion, consent was given to proceed.  BRIEF SUMMARY OF PROCEDURE:  The patient was taken to the operating room where general anesthesia was induced.  The right lower extremity was prepped and draped in usual sterile fashion with chlorhexidene wash then betadine  scrub and paint.  No tourniquet was used during the procedure. Time out was held.  With the leg on a radiolucent triangle the C-arm was brought in to mark the starting point on AP and lateral images distally, and the distal incision made.  Dissection was carried carefully down to the retinaculum, which was incised and divided to accommodate plate placement.  Using bumps and traction we were able to secure an excellent reduction on the  AP and lateral views. Appropriate length Smith Nephew plate was then inserted and positioned with the proximal portion on the jig centered on the shaft.  Once we were satisfied with position on AP and lateral images, the condyle was manipulated into proper alignment and a pin was placed distally followed by a drill bit in a proximal shaft hole. I placed the large standard screw distally to maximally appose the plate to the bone distally then an additional several locked screws. We secured four bicortical screws proximally and several more locking screws into the supracondylar region distally.  Wounds were irrigated thoroughly.  Final C-arm images confirmed appropriate reduction, hardware placement, trajectory and length.  I then evaluated the knee joint by applying manual stress under live fluoroscopy, which showed no ligamentous instability.  All wounds were irrigated thoroughly and then closed in standard layered fashion using #1 Vicryl, #0 Vicryl, 2-0 Vicryl, and 2-0 nylon.  A gently compressive dressing was applied from foot to thigh and then knee immobilizer. The patient was taken to PACU in stable condition.  Francis Mt, PA-C, assisted me throughout and required to effectively produce, control, and maintain the reduction during provisional and definitive internal fixation. He also assisted with wound closure.  PROGNOSIS:  The patient's underlying medical and social condition increase the risks of complications including deconditioning and loss of motion while awaiting clearance for weight bearing. Patient will be nonweightbearing on the operative extremity with early mobilization encouraged. Probable advancement to WBAT at 4 to 6 weeks. Pharmacologic DVT prophylaxis will be with Lovenox . No bracing is indicated. If the plate is prominent or symptomatic late removal could be  considered after six to twelve months with adequate healing.     Ozell DEL. Celena, M.D.

## 2024-04-01 NOTE — Progress Notes (Addendum)
 Received pt via [facility] from [facility],pt is awake, non-verval, pleasant and cooperative. Consent for surgery obtained from pt's brother Jaidon Sponsel. A staff from the [facility]  is present at the bedside and helped answer pt's history for the Pre-procedure checklist completion.

## 2024-04-01 NOTE — ED Notes (Signed)
EMTALA reviewed. 

## 2024-04-01 NOTE — Anesthesia Preprocedure Evaluation (Addendum)
 Anesthesia Evaluation  Patient identified by MRN, date of birth, ID band Patient awake    Reviewed: Allergy & Precautions, NPO status , Patient's Chart, lab work & pertinent test results, Unable to perform ROS - Chart review only  Airway Mallampati: III  TM Distance: >3 FB     Dental no notable dental hx. (+) Teeth Intact, Poor Dentition, Dental Advisory Given, Missing   Pulmonary neg pulmonary ROS   Pulmonary exam normal breath sounds clear to auscultation       Cardiovascular hypertension, Pt. on medications + Peripheral Vascular Disease  Normal cardiovascular exam+ Valvular Problems/Murmurs AI  Rhythm:Regular Rate:Normal     Neuro/Psych  PSYCHIATRIC DISORDERS Anxiety    Dementia Mental retardation Explosive personality disorder Autism- non verbal at baselinenegative neurological ROS     GI/Hepatic negative GI ROS,,,(+) Hepatitis -, C  Endo/Other  negative endocrine ROS    Renal/GU Renal disease  negative genitourinary   Musculoskeletal Right distal femur Fx   Abdominal   Peds  Hematology  (+) Blood dyscrasia, anemia Plavix  therapy- last dose yesterday   Anesthesia Other Findings   Reproductive/Obstetrics                              Anesthesia Physical Anesthesia Plan  ASA: 3  Anesthesia Plan: General   Post-op Pain Management: Dilaudid  IV, Precedex and Ofirmev  IV (intra-op)*   Induction: Intravenous  PONV Risk Score and Plan: 4 or greater and Treatment may vary due to age or medical condition, Ondansetron  and Propofol  infusion  Airway Management Planned: LMA  Additional Equipment: None  Intra-op Plan:   Post-operative Plan: Extubation in OR  Informed Consent: I have reviewed the patients History and Physical, chart, labs and discussed the procedure including the risks, benefits and alternatives for the proposed anesthesia with the patient or authorized representative who  has indicated his/her understanding and acceptance.     Dental advisory given  Plan Discussed with: CRNA and Anesthesiologist  Anesthesia Plan Comments:          Anesthesia Quick Evaluation

## 2024-04-01 NOTE — Plan of Care (Signed)
   Problem: Health Behavior/Discharge Planning: Goal: Ability to manage health-related needs will improve Outcome: Progressing   Problem: Clinical Measurements: Goal: Ability to maintain clinical measurements within normal limits will improve Outcome: Progressing Goal: Will remain free from infection Outcome: Progressing

## 2024-04-02 ENCOUNTER — Encounter (HOSPITAL_COMMUNITY): Payer: Self-pay | Admitting: Orthopedic Surgery

## 2024-04-02 DIAGNOSIS — S72451K Displaced supracondylar fracture without intracondylar extension of lower end of right femur, subsequent encounter for closed fracture with nonunion: Secondary | ICD-10-CM | POA: Diagnosis not present

## 2024-04-02 LAB — BASIC METABOLIC PANEL WITH GFR
Anion gap: 11 (ref 5–15)
BUN: 31 mg/dL — ABNORMAL HIGH (ref 8–23)
CO2: 20 mmol/L — ABNORMAL LOW (ref 22–32)
Calcium: 9 mg/dL (ref 8.9–10.3)
Chloride: 105 mmol/L (ref 98–111)
Creatinine, Ser: 1 mg/dL (ref 0.44–1.00)
GFR, Estimated: 59 mL/min — ABNORMAL LOW (ref >60)
Glucose, Bld: 161 mg/dL — ABNORMAL HIGH (ref 70–99)
Potassium: 4.5 mmol/L (ref 3.5–5.1)
Sodium: 136 mmol/L (ref 135–145)

## 2024-04-02 LAB — VITAMIN D 25 HYDROXY (VIT D DEFICIENCY, FRACTURES): Vit D, 25-Hydroxy: 59.32 ng/mL (ref 30–100)

## 2024-04-02 LAB — CBC
HCT: 25 % — ABNORMAL LOW (ref 36.0–46.0)
Hemoglobin: 8.4 g/dL — ABNORMAL LOW (ref 12.0–15.0)
MCH: 30.5 pg (ref 26.0–34.0)
MCHC: 33.6 g/dL (ref 30.0–36.0)
MCV: 90.9 fL (ref 80.0–100.0)
Platelets: 231 K/uL (ref 150–400)
RBC: 2.75 MIL/uL — ABNORMAL LOW (ref 3.87–5.11)
RDW: 14.3 % (ref 11.5–15.5)
WBC: 13 K/uL — ABNORMAL HIGH (ref 4.0–10.5)
nRBC: 0 % (ref 0.0–0.2)

## 2024-04-02 MED ORDER — SENNOSIDES-DOCUSATE SODIUM 8.6-50 MG PO TABS: 1.0000 | ORAL_TABLET | Freq: Every evening | ORAL | Status: DC | PRN

## 2024-04-02 MED ORDER — HYDRALAZINE HCL 20 MG/ML IJ SOLN: 10.0000 mg | INTRAMUSCULAR | Status: DC | PRN

## 2024-04-02 MED ORDER — IPRATROPIUM-ALBUTEROL 0.5-2.5 (3) MG/3ML IN SOLN: 3.0000 mL | RESPIRATORY_TRACT | Status: DC | PRN

## 2024-04-02 MED ORDER — TRAZODONE HCL 50 MG PO TABS: 50.0000 mg | ORAL_TABLET | Freq: Every evening | ORAL | Status: DC | PRN

## 2024-04-02 MED ORDER — METOPROLOL TARTRATE 5 MG/5ML IV SOLN: 5.0000 mg | INTRAVENOUS | Status: DC | PRN

## 2024-04-02 NOTE — Discharge Instructions (Addendum)
 Orthopaedic Trauma Service Discharge Instructions   General Discharge Instructions  Orthopaedic Injuries:  Right distal femur fracture treated with open reduction internal fixation using plate and screws  WEIGHT BEARING STATUS: Touchdown weightbearing right leg with assistance  RANGE OF MOTION/ACTIVITY: Unrestricted range of motion right knee.  Do not let knee rest in a flexed position.  Activity as tolerated while maintaining weightbearing restrictions  Bone health: Vitamin D  levels updated.  Vitamin D  levels are 59.32 ng/mL  Review the following resource for additional information regarding bone health  [email or web address].cy  Wound Care: Daily wound care as needed starting when you discharged from the hospital.  See below  Discharge Wound Care Instructions  Do NOT apply any ointments, solutions or lotions to pin sites or surgical wounds.  These prevent needed drainage and even though solutions like hydrogen peroxide kill bacteria, they also damage cells lining the pin sites that help fight infection.  Applying lotions or ointments can keep the wounds moist and can cause them to breakdown and open up as well. This can increase the risk for infection. When in doubt call the office.  Surgical incisions should be dressed daily.  If any drainage is noted, use one layer of adaptic or Mepitel, then gauze, Kerlix, and an ace wrap.  Alternatively you can use a silicone foam dressing such as a Mepilex.  This is which you have on currently.  Instead of an Ace wrap you can use compression sock if you have significant swelling to your lower extremity  [email or web address] [email or web address]_[email or web address]  [email or web address]  These dressing supplies should be available at  local medical supply stores ([facility] [employer], [facility], etc). They are not usually carried at places like [facility], [facility], [facility], etc  Once the incision is completely dry and without drainage, it may be left open to air out.  Showering may begin 36-48 hours later.  Cleaning gently with soap and water.   DVT/PE prophylaxis: Continue Plavix   Diet: as you were eating previously.  Can use over the counter stool softeners and bowel preparations, such as Miralax , to help with bowel movements.  Narcotics can be constipating.  Be sure to drink plenty of fluids  PAIN MEDICATION USE AND EXPECTATIONS  You have likely been given narcotic medications to help control your pain.  After a traumatic event that results in an fracture (broken bone) with or without surgery, it is ok to use narcotic pain medications to help control one's pain.  We understand that everyone responds to pain differently and each individual patient will be evaluated on a regular basis for the continued need for narcotic medications. Ideally, narcotic medication use should last no more than 6-8 weeks (coinciding with fracture healing).   As a patient it is your responsibility as well to monitor narcotic medication use and report the amount and frequency you use these medications when you come to your office visit.   We would also advise that if you are using narcotic medications, you should take a dose prior to therapy to maximize you participation.  IF YOU ARE ON NARCOTIC MEDICATIONS IT IS NOT PERMISSIBLE TO OPERATE A MOTOR VEHICLE (MOTORCYCLE/CAR/TRUCK/MOPED) OR HEAVY MACHINERY DO NOT MIX NARCOTICS WITH OTHER CNS (CENTRAL NERVOUS SYSTEM) DEPRESSANTS SUCH AS ALCOHOL   POST-OPERATIVE OPIOID TAPER INSTRUCTIONS: It is important to wean off of your opioid medication as soon as possible. If you do not need pain medication after your surgery it is ok to stop day one. Opioids include: Codeine, Hydrocodone (Norco, Vicodin),  Oxycodone (Percocet, oxycontin ) and  hydromorphone  amongst others.  Long term and even short term use of opiods can cause: Increased pain response Dependence Constipation Depression Respiratory depression And more.  Withdrawal symptoms can include Flu like symptoms Nausea, vomiting And more Techniques to manage these symptoms Hydrate well Eat regular healthy meals Stay active Use relaxation techniques(deep breathing, meditating, yoga) Do Not substitute Alcohol to help with tapering If you have been on opioids for less than two weeks and do not have pain than it is ok to stop all together.  Plan to wean off of opioids This plan should start within one week post op of your fracture surgery  Maintain the same interval or time between taking each dose and first decrease the dose.  Cut the total daily intake of opioids by one tablet each day Next start to increase the time between doses. The last dose that should be eliminated is the evening dose.    STOP SMOKING OR USING NICOTINE PRODUCTS!!!!  As discussed nicotine severely impairs your body's ability to heal surgical and traumatic wounds but also impairs bone healing.  Wounds and bone heal by forming microscopic blood vessels (angiogenesis) and nicotine is a vasoconstrictor (essentially, shrinks blood vessels).  Therefore, if vasoconstriction occurs to these microscopic blood vessels they essentially disappear and are unable to deliver necessary nutrients to the healing tissue.  This is one modifiable factor that you can do to dramatically increase your chances of healing your injury.    (This means no smoking, no nicotine gum, patches, etc)  DO NOT USE NONSTEROIDAL ANTI-INFLAMMATORY DRUGS (NSAID'S)  Using products such as Advil (ibuprofen), Aleve (naproxen), Motrin (ibuprofen) for additional pain control during fracture healing can delay and/or prevent the healing response.  If you would like to take over the counter (OTC) medication,  Tylenol  (acetaminophen ) is ok.  However, some narcotic medications that are given for pain control contain acetaminophen  as well. Therefore, you should not exceed more than 4000 mg of tylenol  in a day if you do not have liver disease.  Also note that there are may OTC medicines, such as cold medicines and allergy medicines that my contain tylenol  as well.  If you have any questions about medications and/or interactions please ask your doctor/PA or your pharmacist.      ICE AND ELEVATE INJURED/OPERATIVE EXTREMITY  Using ice and elevating the injured extremity above your heart can help with swelling and pain control.  Icing in a pulsatile fashion, such as 20 minutes on and 20 minutes off, can be followed.    Do not place ice directly on skin. Make sure there is a barrier between to skin and the ice pack.    Using frozen items such as frozen peas works well as the conform nicely to the are that needs to be iced.  USE AN ACE WRAP OR TED HOSE FOR SWELLING CONTROL  In addition to icing and elevation, Ace wraps or TED hose are used to help limit and resolve swelling.  It is recommended to use Ace wraps or TED hose until you are informed to stop.    When using Ace Wraps start the wrapping distally (farthest away from the body) and wrap proximally (closer to the body)   Example: If you had surgery on your leg and you do not have a splint on, start the ace wrap at the toes and work your way up to the thigh        If you had surgery on your upper extremity and do not  have a splint on, start the ace wrap at your fingers and work your way up to the upper arm  IF YOU ARE IN A SPLINT OR CAST DO NOT REMOVE IT FOR ANY REASON   If your splint gets wet for any reason please contact the office immediately. You may shower in your splint or cast as long as you keep it dry.  This can be done by wrapping in a cast cover or garbage back (or similar)  Do Not stick any thing down your splint or cast such as pencils, money, or  hangers to try and scratch yourself with.  If you feel itchy take benadryl  as prescribed on the bottle for itching  IF YOU ARE IN A CAM BOOT (BLACK BOOT)  You may remove boot periodically. Perform daily dressing changes as noted below.  Wash the liner of the boot regularly and wear a sock when wearing the boot. It is recommended that you sleep in the boot until told otherwise    Call office for the following: Temperature greater than 101F Persistent nausea and vomiting Severe uncontrolled pain Redness, tenderness, or signs of infection (pain, swelling, redness, odor or green/yellow discharge around the site) Difficulty breathing, headache or visual disturbances Hives Persistent dizziness or light-headedness Extreme fatigue Any other questions or concerns you may have after discharge  In an emergency, call 911 or go to an Emergency Department at a nearby hospital  HELPFUL INFORMATION  If you had a block, it will wear off between 8-24 hrs postop typically.  This is period when your pain may go from nearly zero to the pain you would have had postop without the block.  This is an abrupt transition but nothing dangerous is happening.  You may take an extra dose of narcotic when this happens.  You should wean off your narcotic medicines as soon as you are able.  Most patients will be off or using minimal narcotics before their first postop appointment.   We suggest you use the pain medication the first night prior to going to bed, in order to ease any pain when the anesthesia wears off. You should avoid taking pain medications on an empty stomach as it will make you nauseous.  Do not drink alcoholic beverages or take illicit drugs when taking pain medications.  In most states it is against the law to drive while you are in a splint or sling.  And certainly against the law to drive while taking narcotics.  You may return to work/school in the next couple of days when you feel up to it.    Pain medication may make you constipated.  Below are a few solutions to try in this order: Decrease the amount of pain medication if you aren't having pain. Drink lots of decaffeinated fluids. Drink prune juice and/or each dried prunes  If the first 3 don't work start with additional solutions Take Colace - an over-the-counter stool softener Take Senokot - an over-the-counter laxative Take Miralax  - a stronger over-the-counter laxative     CALL THE OFFICE WITH ANY QUESTIONS OR CONCERNS: [phone]   VISIT OUR WEBSITE FOR ADDITIONAL INFORMATION: [email or web address]

## 2024-04-02 NOTE — Progress Notes (Signed)
 Pt refusing to wear Bone foam and pushing it off her foot.

## 2024-04-02 NOTE — Progress Notes (Signed)
 PROGRESS NOTE    Melody Stone  FMW:[id number] DOB: Dec 25, 1950 DOA: 04/01/2024 PCP: Cleotilde Oneil FALCON, MD    Brief Narrative:  73 year old with history of dementia, cognitive delay, hepatitis C, HTN, HLD, GERD, dysphagia, aortic insufficiency, chronic anemia, frequent falls presented to the hospital for unwitnessed fall at the group home.  [facility] x-ray showed distal femur fracture, orthopedic was consulted.  Patient underwent ORIF on 8/4.   Assessment & Plan:  Principal Problem:   S/P right hip fracture    Unwitnessed fall causing displaced fracture of the right distal femoral metaphysis. -Status post ORIF by orthopedic on 8/4.  Postop management, wound care, weightbearing precautions, pain management and follow-up by surgical team.   GERD PPI   HLD Lipitor   H/o dementia Depression Aricept  and Namenda  Seroquel , Effexor   Renal artery stenosis - Follows outpatient vascular. Plavix  & Statin by outptn Vascular.   Essential hypertension - On verapamil .  IV as needed    DVT prophylaxis: SCD    Code Status: Full Code Family Communication:   Status is: Inpatient Remains inpatient appropriate because: Post Op management per ortho.     Subjective: Seen at bedside.  Overall nonverbal.  Effective communication due to cognitive delay.  Overall appears to be comfortable   Examination:  General exam: Appears calm and comfortable  Respiratory system: Clear to auscultation. Respiratory effort normal. Cardiovascular system: S1 & S2 heard, RRR. No JVD, murmurs, rubs, gallops or clicks. No pedal edema. Gastrointestinal system: Abdomen is nondistended, soft and nontender. No organomegaly or masses felt. Normal bowel sounds heard. Central nervous system: Alert and oriented. No focal neurological deficits. Extremities: Symmetric 5 x 5 power. Skin: Dressing noted Psychiatry: Judgement and insight appear normal. Mood & affect appropriate.                Diet Orders (From  admission, onward)     Start     Ordered   04/01/24 1758  Diet regular Room service appropriate? Yes; Fluid consistency: Thin  Diet effective now       Question Answer Comment  Room service appropriate? Yes   Fluid consistency: Thin      04/01/24 1757            Objective: Vitals:   04/01/24 2041 04/01/24 2338 04/02/24 0400 04/02/24 0855  BP: (!) 132/99 (!) 143/65 (!) 145/63 (!) 157/75  Pulse: 93 (!) 109 88   Resp: 20 18 18 18   Temp: 98.3 F (36.8 C) 98 F (36.7 C) 97.8 F (36.6 C)   TempSrc: Oral Oral Oral   SpO2: 100% 91% 93% 98%  Weight:      Height:        Intake/Output Summary (Last 24 hours) at 04/02/2024 1111 Last data filed at 04/02/2024 0400 Gross per 24 hour  Intake 400 ml  Output 420 ml  Net -20 ml   Filed Weights   04/01/24 1053  Weight: 54.4 kg    Scheduled Meds:  acetaminophen   1,000 mg Oral Q8H   atorvastatin   10 mg Oral Daily   clopidogrel   75 mg Oral Daily   docusate sodium   100 mg Oral BID   donepezil   5 mg Oral QHS   memantine   10 mg Oral BID   methocarbamol   500 mg Oral TID   pantoprazole   40 mg Oral Daily   polyethylene glycol  17 g Oral Daily   QUEtiapine   25 mg Oral QHS   senna-docusate  1 tablet Oral QHS   venlafaxine   XR  37.5 mg Oral Daily   verapamil   180 mg Oral BID   Continuous Infusions:  Nutritional status     Body mass index is 20.59 kg/m.  Data Reviewed:   CBC: Recent Labs  Lab 04/01/24 0736 04/02/24 0646  WBC 16.8* 13.0*  NEUTROABS 13.4*  --   HGB 10.3* 8.4*  HCT 29.8* 25.0*  MCV 88.2 90.9  PLT 247 231   Basic Metabolic Panel: Recent Labs  Lab 04/01/24 0736 04/02/24 0646  NA 140 136  K 4.1 4.5  CL 103 105  CO2 23 20*  GLUCOSE 105* 161*  BUN 21 31*  CREATININE 0.65 1.00  CALCIUM  9.5 9.0   GFR: Estimated Creatinine Clearance: 41.7 mL/min (by C-G formula based on SCr of 1 mg/dL). Liver Function Tests: Recent Labs  Lab 04/01/24 0736  AST 23  ALT 16  ALKPHOS 90  BILITOT 1.0  PROT 6.5   ALBUMIN 3.5   No results for input(s): LIPASE, AMYLASE in the last 168 hours. No results for input(s): AMMONIA in the last 168 hours. Coagulation Profile: No results for input(s): INR, PROTIME in the last 168 hours. Cardiac Enzymes: No results for input(s): CKTOTAL, CKMB, CKMBINDEX, TROPONINI in the last 168 hours. BNP (last 3 results) No results for input(s): PROBNP in the last 8760 hours. HbA1C: No results for input(s): HGBA1C in the last 72 hours. CBG: No results for input(s): GLUCAP in the last 168 hours. Lipid Profile: No results for input(s): CHOL, HDL, LDLCALC, TRIG, CHOLHDL, LDLDIRECT in the last 72 hours. Thyroid Function Tests: No results for input(s): TSH, T4TOTAL, FREET4, T3FREE, THYROIDAB in the last 72 hours. Anemia Panel: No results for input(s): VITAMINB12, FOLATE, FERRITIN, TIBC, IRON, RETICCTPCT in the last 72 hours. Sepsis Labs: No results for input(s): PROCALCITON, LATICACIDVEN in the last 168 hours.  Recent Results (from the past 240 hours)  Surgical pcr screen     Status: None   Collection Time: 04/01/24 10:53 AM   Specimen: Nasal Mucosa; Nasal Swab  Result Value Ref Range Status   MRSA, PCR NEGATIVE NEGATIVE Final   Staphylococcus aureus NEGATIVE NEGATIVE Final    Comment: (NOTE) The Xpert SA Assay (FDA approved for NASAL specimens in patients 59 years of age and older), is one component of a comprehensive surveillance program. It is not intended to diagnose infection nor to guide or monitor treatment. Performed at [facility], [address], [place], [place] [address]          Radiology Studies: DG Knee Right Port Result Date: 04/01/2024 CLINICAL DATA:  Fracture, postop. EXAM: PORTABLE RIGHT KNEE - 1-2 VIEW COMPARISON:  Preoperative imaging FINDINGS: Lateral plate and screw fixation of distal femur fracture. Improved fracture alignment from preoperative imaging. Mild residual  displacement persists. Recent postsurgical change includes air and edema in the soft tissues. IMPRESSION: ORIF of distal femur fracture with improved fracture alignment from preoperative imaging. Electronically Signed   By: Andrea Gasman M.D.   On: 04/01/2024 17:07   DG FEMUR, MIN 2 VIEWS RIGHT Result Date: 04/01/2024 CLINICAL DATA:  Elective surgery. EXAM: RIGHT FEMUR 2 VIEWS COMPARISON:  Radiograph earlier today FINDINGS: Five fluoroscopic spot views of the right femur submitted from the operating room. Lateral plate and screw fixation of distal femur fracture. Improved fracture alignment from preoperative imaging. Fluoroscopy time 1 minutes 6 seconds. Dose 4.88 mGy. IMPRESSION: Intraoperative fluoroscopy during ORIF of distal femur fracture. Electronically Signed   By: Andrea Gasman M.D.   On: 04/01/2024 17:06  DG C-Arm 1-60 Min-No Report Result Date: 04/01/2024 Fluoroscopy was utilized by the requesting physician.  No radiographic interpretation.   DG C-Arm 1-60 Min-No Report Result Date: 04/01/2024 Fluoroscopy was utilized by the requesting physician.  No radiographic interpretation.   CT Cervical Spine Wo Contrast Result Date: 04/01/2024 CLINICAL DATA:  Neck trauma (Age >= 65y) EXAM: CT CERVICAL SPINE WITHOUT CONTRAST TECHNIQUE: Multidetector CT imaging of the cervical spine was performed without intravenous contrast. Multiplanar CT image reconstructions were also generated. RADIATION DOSE REDUCTION: This exam was performed according to the departmental dose-optimization program which includes automated exposure control, adjustment of the mA and/or kV according to patient size and/or use of iterative reconstruction technique. COMPARISON:  March 22, 2024 FINDINGS: Alignment: Unchanged reversal of the normal cervical lordosis. No spondylolisthesis, uncovering of the facet joints, or significant widening of the spinous processes. No subluxation or abnormality identified at the craniovertebral  junction. Skull base and vertebrae: Vertebral body heights are preserved. No acute fracture. No primary bone lesion or focal pathologic process.The lateral masses of C1 are well aligned with C2. The odontoid is intact. Soft tissues and spinal canal: No prevertebral edema or soft tissue thickening. No visible canal hematoma. Disc levels: Redemonstrated multilevel intervertebral disc height loss with bridging osteophyte formation involving C4-C5 through C7-T1. Similar severe degenerative changes on the right at C1-C2. Multilevel moderate facet arthropathy. Upper chest: Centrilobular emphysema. No focal airspace consolidation or pleural effusion. Other: None IMPRESSION: 1. No acute fracture or traumatic malalignment of the cervical spine. 2. Similarly appearing moderate multilevel degenerative disc disease of the spine. Electronically Signed   By: Rogelia Myers M.D.   On: 04/01/2024 08:48   CT Head Wo Contrast Result Date: 04/01/2024 CLINICAL DATA:  Head trauma, minor (Age >= 65y) EXAM: CT HEAD WITHOUT CONTRAST TECHNIQUE: Contiguous axial images were obtained from the base of the skull through the vertex without intravenous contrast. RADIATION DOSE REDUCTION: This exam was performed according to the departmental dose-optimization program which includes automated exposure control, adjustment of the mA and/or kV according to patient size and/or use of iterative reconstruction technique. COMPARISON:  March 22, 2024 FINDINGS: Motion degraded study. Brain: Chronic proportional prominence of the ventricles and sulci, consistent with diffuse cerebral parenchymal volume loss. The ventricles otherwise maintained midline position without midline shift. Gray-white differentiation is preserved.Periventricular and subcortical white matter hypoattenuation, most consistent with changes of moderate chronic ischemic microvascular disease.No evidence of acute territorial infarction, extra-axial fluid collection, hemorrhage, or mass  lesion. The basilar cisterns are patent without downward herniation. The cerebellar hemispheres and vermis are well formed without mass lesion or focal attenuation abnormality. Vascular: No hyperdense vessel. Skull: Normal. Negative for fracture or focal lesion. Sinuses/Orbits: The paranasal sinuses and mastoids are clear.The globes appear intact. No retrobulbar hematoma. Other: None. IMPRESSION: 1. No acute intracranial abnormality, specifically, no acute hemorrhage, territorial infarction, or intracranial mass. 2. Similar global cerebral volume loss with sequelae of advanced chronic ischemic microvascular disease. Electronically Signed   By: Rogelia Myers M.D.   On: 04/01/2024 08:41   DG Pelvis 1-2 Views Result Date: 04/01/2024 CLINICAL DATA:  [id number] Fall [id number] EXAM: PELVIS - 1-2 VIEW; RIGHT FEMUR 2 VIEWS COMPARISON:  Dec 28, 2021 FINDINGS: Pelvis Osteopenia.Cortical irregularity of the superior and inferior pubic rami adjacent to the symphyseal pubis.No acute hip fracture or dislocation.There is no evidence of arthropathy or other focal bone abnormality.Soft tissues are unremarkable. Femur Mildly-comminuted, predominantly obliquely oriented, angulated, and displaced fracture of the distal femoral metaphysis. There is approximately 30  degrees of posterior angulation of the largest distal femoral fragment. This fragment is 1.6 cm displaced with 2.5 cm of bony overlap. There is no evidence of arthropathy or other focal bone abnormality. Soft tissues are unremarkable. IMPRESSION: 1. Cortical irregularity of the parasymphyseal right superior and inferior pubic rami, which are in a region of fracture on the prior CT. These could represent healed fractures or nondisplaced acute fractures. Correlation with point tenderness recommended. 2. Mildly comminuted, angulated, and displaced fracture of the distal right femoral metaphysis. Electronically Signed   By: Rogelia Myers M.D.   On: 04/01/2024 08:24   DG FEMUR,  MIN 2 VIEWS RIGHT Result Date: 04/01/2024 CLINICAL DATA:  [id number] Fall [id number] EXAM: PELVIS - 1-2 VIEW; RIGHT FEMUR 2 VIEWS COMPARISON:  Dec 28, 2021 FINDINGS: Pelvis Osteopenia.Cortical irregularity of the superior and inferior pubic rami adjacent to the symphyseal pubis.No acute hip fracture or dislocation.There is no evidence of arthropathy or other focal bone abnormality.Soft tissues are unremarkable. Femur Mildly-comminuted, predominantly obliquely oriented, angulated, and displaced fracture of the distal femoral metaphysis. There is approximately 30 degrees of posterior angulation of the largest distal femoral fragment. This fragment is 1.6 cm displaced with 2.5 cm of bony overlap. There is no evidence of arthropathy or other focal bone abnormality. Soft tissues are unremarkable. IMPRESSION: 1. Cortical irregularity of the parasymphyseal right superior and inferior pubic rami, which are in a region of fracture on the prior CT. These could represent healed fractures or nondisplaced acute fractures. Correlation with point tenderness recommended. 2. Mildly comminuted, angulated, and displaced fracture of the distal right femoral metaphysis. Electronically Signed   By: Rogelia Myers M.D.   On: 04/01/2024 08:24           LOS: 1 day   Time spent= 35 mins    Melody JAYSON Dare, MD [facility]  If 7PM-7AM, please contact night-coverage  04/02/2024, 11:11 AM

## 2024-04-02 NOTE — Evaluation (Signed)
 Clinical/Bedside Swallow Evaluation Patient Details  Name: Melody Stone MRN: [id number] Date of Birth: 1951-02-16  Today's Date: 04/02/2024 Time: SLP Start Time (ACUTE ONLY): 1350 SLP Stop Time (ACUTE ONLY): 1401 SLP Time Calculation (min) (ACUTE ONLY): 11 min  Past Medical History:  Past Medical History:  Diagnosis Date   Anemia    Anxiety    Aortic insufficiency    a. 10/2015 Echo: Nl LV fxn, mild to moderate AI; b. 02/2017 Echo: EF 60-65%, no rwma, mild AI.   Autistic disorder    nonverbal at baseline   Cataracts, bilateral    Chest pain    a. 10/2015 Ex MV Avelina): EF 69%, no ischemia; b. 01/2017 Lexiscan  MV: EF 45%, no ischemia/infarct-->Low risk.   Dementia (HCC)    Dyspnea on exertion    Esophagitis    Hepatitis C    HLD (hyperlipidemia)    Hypertension    Intermittent explosive disorder    Mental retardation    Panic disorder    Reflux esophagitis    Renal failure    Past Surgical History:  Past Surgical History:  Procedure Laterality Date   ABDOMINAL HYSTERECTOMY     APPENDECTOMY     ESOPHAGOGASTRODUODENOSCOPY (EGD) WITH PROPOFOL  N/A 02/07/2023   Procedure: ESOPHAGOGASTRODUODENOSCOPY (EGD) WITH PROPOFOL ;  Surgeon: Therisa Bi, MD;  Location: [facility] ENDOSCOPY;  Service: Gastroenterology;  Laterality: N/A;  [name] [place] - BROTHER IS LEGAL GUARDIAN WILL GIVE VERBAL CONSENT ON PHONE # [phone] PATIENT IS IN [facility]   ORIF FEMUR FRACTURE Right 04/01/2024   Procedure: OPEN REDUCTION INTERNAL FIXATION (ORIF) DISTAL FEMUR FRACTURE;  Surgeon: Celena Sharper, MD;  Location: [facility] OR;  Service: Orthopedics;  Laterality: Right;   RENAL ANGIOGRAPHY N/A 12/15/2022   Procedure: RENAL ANGIOGRAPHY;  Surgeon: Marea Selinda RAMAN, MD;  Location: [facility] INVASIVE CV LAB;  Service: Cardiovascular;  Laterality: N/A;   HPI:  Patient is a 73 y.o. female with PMH: cognitive delay, dementia, hep C, HTN, HLD, GERD, dysphagia, aortic insufficiency, chronic anemia. She presented to the  hospital on 04/01/24 after an unwittnessed fall at group home resulting in right hip fracture. She underwent a right femur ORIF.    Assessment / Plan / Recommendation  Clinical Impression  Patient is presenting with what appears to be baseline oral dysphagia with impact from cognitive impairment. No overt s/s aspiration observed with liquids or solids. She exhibited inefficient mastication of solids, leading to mild amount of oral residuals which cleared with sips of liquids and bites of applesauce. SLP recommending dysphagia 3 (mechanical soft) solids, thin liquids, but no further intervention. SLP Visit Diagnosis: Dysphagia, oral phase (R13.11)    Aspiration Risk  Mild aspiration risk    Diet Recommendation Dysphagia 3 (Mech soft);Thin liquid    Medication Administration: Other (Comment) (as tolerated) Supervision: Patient able to self feed;Full supervision/cueing for compensatory strategies Compensations: Slow rate;Small sips/bites Postural Changes: Seated upright at 90 degrees    Other  Recommendations Oral Care Recommendations: Oral care BID;Staff/trained caregiver to provide oral care     Assistance Recommended at Discharge    Functional Status Assessment Patient has not had a recent decline in their functional status  Frequency and Duration     N/A       Prognosis   N/A     Swallow Study   General Date of Onset: 04/01/24 HPI: Patient is a 73 y.o. female with PMH: cognitive delay, dementia, hep C, HTN, HLD, GERD, dysphagia, aortic insufficiency, chronic anemia. She presented to the hospital  on 04/01/24 after an unwittnessed fall at group home resulting in right hip fracture. She underwent a right femur ORIF. Type of Study: Bedside Swallow Evaluation Previous Swallow Assessment: MBS in 2017 and 2021 Diet Prior to this Study: Regular;Thin liquids (Level 0) Temperature Spikes Noted: No Respiratory Status: Room air History of Recent Intubation: Yes Total duration of intubation  (days):  (for surgery only, extubated in OR) Behavior/Cognition: Alert;Cooperative;Pleasant mood Oral Cavity Assessment: Within Functional Limits Oral Care Completed by SLP: No Oral Cavity - Dentition: Adequate natural dentition Vision: Functional for self-feeding Self-Feeding Abilities: Able to feed self Patient Positioning: Upright in bed Baseline Vocal Quality: Not observed Volitional Cough: Cognitively unable to elicit Volitional Swallow: Unable to elicit    Oral/Motor/Sensory Function Overall Oral Motor/Sensory Function: Within functional limits   Ice Chips     Thin Liquid Thin Liquid: Within functional limits Presentation: Straw;Self Fed    Nectar Thick     Honey Thick     Puree Puree: Within functional limits Presentation: Spoon   Solid     Solid: Impaired Oral Phase Functional Implications: Impaired mastication;Oral residue     Norleen IVAR Blase, MA, CCC-SLP Speech Therapy

## 2024-04-02 NOTE — Evaluation (Signed)
 Physical Therapy Evaluation Patient Details Name: Melody Stone MRN: [id number] DOB: 1950/12/10 Today's Date: 04/02/2024  History of Present Illness  Pt is 73 yo female who presents on 04/01/24 after unwitnessed fall at group home resulting in R hip fx. Underwent R femur ORIF. PMH: dementia, cognitive delay, hep C, HTN, HLD, GERD, dysphagia, anemia, aortic insufficiency.  Clinical Impression  Pt admitted with above diagnosis. Pt from group home where she was mobile but apparently had numerous falls. Pt does not follow many verbal commands but does follow visual and gestural cues. Minimal attempt to speak during session. Tends to keep RLE in knee and hip flexion in bed. Will try zero knee foam for extension when it arrives to room. Pt came to EOB and pivoted to chair with mod A +2. Thankfully pt did not attempt to put RLE on floor during pivot because pt unable to understand TDWB status. Expect she will need to be at transfer level with w/c for mobility until she can bear more wt. Patient will benefit from continued inpatient follow up therapy, <3 hours/day  Pt currently with functional limitations due to the deficits listed below (see PT Problem List). Pt will benefit from acute skilled PT to increase their independence and safety with mobility to allow discharge.           If plan is discharge home, recommend the following: A lot of help with walking and/or transfers;A lot of help with bathing/dressing/bathroom;Assist for transportation   Can travel by private vehicle   No    Equipment Recommendations Wheelchair (measurements PT);Wheelchair cushion (measurements PT)  Recommendations for Other Services       Functional Status Assessment Patient has had a recent decline in their functional status and demonstrates the ability to make significant improvements in function in a reasonable and predictable amount of time.     Precautions / Restrictions Precautions Precautions: Fall Recall of  Precautions/Restrictions: Impaired Precaution/Restrictions Comments: bone foam to help maintain knee extension when not working on motion. Required Braces or Orthoses: Other Brace Other Brace: bone foam (not in room yet) Restrictions Weight Bearing Restrictions Per Provider Order: Yes RLE Weight Bearing Per Provider Order: Touchdown weight bearing Other Position/Activity Restrictions: Patient does not have the cognitive capacity to follow TWB      Mobility  Bed Mobility Overal bed mobility: Needs Assistance Bed Mobility: Supine to Sit     Supine to sit: Mod assist, +2 for physical assistance     General bed mobility comments: mod A +2 at lower body and trunk    Transfers Overall transfer level: Needs assistance Equipment used: 2 person hand held assist Transfers: Sit to/from Stand, Bed to chair/wheelchair/BSC Sit to Stand: Min assist, +2 physical assistance Stand pivot transfers: Mod assist, +2 physical assistance         General transfer comment: pt did not put RLE on floor. Pt initiated sit>stand with therapist patting chair beside her. Mod A +2 during pivot for upper body support    Ambulation/Gait               General Gait Details: do not anticipate pt being able to ambulate and keep RLE TDWB, will likely be at transfer level until WB advanced  Stairs            Wheelchair Mobility     Tilt Bed    Modified Rankin (Stroke Patients Only)       Balance Overall balance assessment: Needs assistance Sitting-balance support: Feet supported Sitting balance-Leahy  Scale: Poor Sitting balance - Comments: posterior lean until brought to EOB and then pt could maintain upright sitting with supervision Postural control: Posterior lean Standing balance support: Bilateral upper extremity supported Standing balance-Leahy Scale: Poor Standing balance comment: reliant on 2 person support                             Pertinent Vitals/Pain Pain  Assessment Pain Assessment: Faces Faces Pain Scale: Hurts little more Pain Location: R leg Pain Descriptors / Indicators: Grimacing Pain Intervention(s): Limited activity within patient's tolerance, Monitored during session    Home Living Family/patient expects to be discharged to:: Skilled nursing facility Living Arrangements: Group Home Available Help at Discharge: Available 24 hours/day;Personal care attendant               Additional Comments: Patient unable to describe home setup or provide PLOF    Prior Function Prior Level of Function : Needs assist             Mobility Comments: has had multiple falls at group home per chart ADLs Comments: Would assume assist with all ADL, patient appears to be able to self feed with setup and probably supervision.     Extremity/Trunk Assessment   Upper Extremity Assessment Upper Extremity Assessment: Defer to OT evaluation    Lower Extremity Assessment Lower Extremity Assessment: RLE deficits/detail;Difficult to assess due to impaired cognition RLE Deficits / Details: keeping RLE in hip and knee flex in bed. Positioned in chair with knee in more extension but may need KI to prevent knee flexion contracture. Will try use of zero knee foam first    Cervical / Trunk Assessment Cervical / Trunk Assessment: Kyphotic  Communication   Communication Communication: Impaired Factors Affecting Communication: Difficulty expressing self    Cognition Arousal: Alert Behavior During Therapy: Anxious   PT - Cognitive impairments: History of cognitive impairments                       PT - Cognition Comments: does not follow verbal comands well but does follow visual and gestural cues about 50% of time Following commands: Impaired Following commands impaired: Follows one step commands inconsistently     Cueing Cueing Techniques: Verbal cues, Tactile cues, Gestural cues     General Comments General comments (skin  integrity, edema, etc.): VSS. Chair alarm pad and posey belt placed with pt in chair. Pt RLE positioned in as much knee extension as she could tolerate    Exercises     Assessment/Plan    PT Assessment Patient needs continued PT services  PT Problem List Decreased strength;Decreased range of motion;Decreased activity tolerance;Decreased balance;Decreased mobility;Decreased cognition;Decreased coordination;Decreased knowledge of use of DME;Decreased safety awareness;Decreased knowledge of precautions;Pain       PT Treatment Interventions DME instruction;Gait training;Functional mobility training;Therapeutic activities;Therapeutic exercise;Balance training;Neuromuscular re-education;Cognitive remediation;Patient/family education    PT Goals (Current goals can be found in the Care Plan section)  Acute Rehab PT Goals Patient Stated Goal: none stated PT Goal Formulation: Patient unable to participate in goal setting Time For Goal Achievement: 04/16/24 Potential to Achieve Goals: Fair    Frequency Min 1X/week     Co-evaluation PT/OT/SLP Co-Evaluation/Treatment: Yes Reason for Co-Treatment: Complexity of the patient's impairments (multi-system involvement);For patient/therapist safety PT goals addressed during session: Mobility/safety with mobility;Balance;Strengthening/ROM OT goals addressed during session: ADL's and self-care       AM-PAC PT 6 Clicks Mobility  Outcome Measure  Help needed turning from your back to your side while in a flat bed without using bedrails?: A Little Help needed moving from lying on your back to sitting on the side of a flat bed without using bedrails?: A Lot Help needed moving to and from a bed to a chair (including a wheelchair)?: A Lot Help needed standing up from a chair using your arms (e.g., wheelchair or bedside chair)?: A Lot Help needed to walk in hospital room?: Total Help needed climbing 3-5 steps with a railing? : Total 6 Click Score: 11     End of Session Equipment Utilized During Treatment: Gait belt Activity Tolerance: Patient tolerated treatment well Patient left: in chair;with call bell/phone within reach;with chair alarm set Nurse Communication: Mobility status PT Visit Diagnosis: Repeated falls (R29.6);Difficulty in walking, not elsewhere classified (R26.2);Pain Pain - Right/Left: Right Pain - part of body: Leg    Time: 8885-8862 PT Time Calculation (min) (ACUTE ONLY): 23 min   Charges:   PT Evaluation $PT Eval Moderate Complexity: 1 Mod   PT General Charges $$ ACUTE PT VISIT: 1 Visit         Richerd Lipoma, PT  Acute Rehab Services Secure chat preferred Office [phone]   Richerd CROME Paraskevi Funez 04/02/2024, 12:24 PM

## 2024-04-02 NOTE — Evaluation (Signed)
 Occupational Therapy Evaluation Patient Details Name: Melody Stone MRN: [id number] DOB: [birth year] Today's Date: 04/02/2024   History of Present Illness   Pt is 73 yo female who presents on 04/01/24 after unwitnessed fall at group home resulting in R hip fx. Underwent R femur ORIF. PMH: dementia, cognitive delay, hep C, HTN, HLD, GERD, dysphagia, anemia, aortic insufficiency.     Clinical Impressions Patient admitted for the diagnosis and procedure above.  At baseline, would assume assist as needed for all ADL,iADL and mobility.  Patient with history of falls.  Patient has baseline dementia with cognitive delays on top of that, and presents as non verbal, not following any commands.  Recommend up and in the recliner by RN staff for meals, and OT will defer attempted rehab to the next level of care.  Would assume transfer to wheelchair and simple grooming and self feeding once up in the chair.  Patient will benefit from continued inpatient follow up therapy, <3 hours/day until group home can take her back, if not, transition to long term care.       If plan is discharge home, recommend the following:   A lot of help with bathing/dressing/bathroom;Two people to help with walking and/or transfers     Functional Status Assessment   Patient has had a recent decline in their functional status and demonstrates the ability to make significant improvements in function in a reasonable and predictable amount of time.     Equipment Recommendations   Wheelchair cushion (measurements OT);Wheelchair (measurements OT)     Recommendations for Other Services         Precautions/Restrictions   Precautions Precautions: Fall Recall of Precautions/Restrictions: Impaired Precaution/Restrictions Comments: bone foam to help maintain knee extension when not working on motion. Restrictions Weight Bearing Restrictions Per Provider Order: Yes RLE Weight Bearing Per Provider Order: Touchdown weight  bearing Other Position/Activity Restrictions: Patient does not have the cognitive capacity to follow TWB     Mobility Bed Mobility Overal bed mobility: Needs Assistance Bed Mobility: Supine to Sit     Supine to sit: Mod assist, +2 for physical assistance          Transfers Overall transfer level: Needs assistance   Transfers: Sit to/from Stand, Bed to chair/wheelchair/BSC Sit to Stand: Min assist, +2 physical assistance Stand pivot transfers: Mod assist, +2 physical assistance                Balance Overall balance assessment: Needs assistance Sitting-balance support: Feet supported Sitting balance-Leahy Scale: Poor   Postural control: Posterior lean Standing balance support: Bilateral upper extremity supported Standing balance-Leahy Scale: Poor                             ADL either performed or assessed with clinical judgement   ADL   Eating/Feeding: Set up;Supervision/ safety;Sitting   Grooming: Minimal assistance;Sitting           Upper Body Dressing : Maximal assistance;Sitting   Lower Body Dressing: Maximal assistance;Bed level   Toilet Transfer: Moderate assistance;+2 for physical assistance;Stand-pivot                   Vision   Vision Assessment?: No apparent visual deficits     Perception Perception: Not tested       Praxis Praxis: Not tested       Pertinent Vitals/Pain Pain Assessment Pain Assessment: Faces Faces Pain Scale: Hurts little more Pain Location: R leg  Pain Descriptors / Indicators: Grimacing Pain Intervention(s): Monitored during session     Extremity/Trunk Assessment Upper Extremity Assessment Upper Extremity Assessment: Difficult to assess due to impaired cognition   Lower Extremity Assessment Lower Extremity Assessment: Defer to PT evaluation   Cervical / Trunk Assessment Cervical / Trunk Assessment: Kyphotic   Communication Communication Communication: Impaired Factors Affecting  Communication: Difficulty expressing self   Cognition Arousal: Alert Behavior During Therapy: Anxious Cognition: History of cognitive impairments             OT - Cognition Comments: Non verbal, not following commands                 Following commands: Impaired Following commands impaired: Follows one step commands inconsistently     Cueing  General Comments   Cueing Techniques: Verbal cues;Tactile cues;Gestural cues  VSS on RA   Exercises     Shoulder Instructions      Home Living Family/patient expects to be discharged to:: Unsure Living Arrangements: Group Home Available Help at Discharge: Available 24 hours/day;Personal care attendant                             Additional Comments: Patient unable to describe home setup or provide PLOF      Prior Functioning/Environment Prior Level of Function : Needs assist               ADLs Comments: Would assume assist with all ADL, patient appears to be able to self feed with setup and probably supervision.    OT Problem List: Decreased strength;Decreased range of motion;Impaired balance (sitting and/or standing);Decreased safety awareness;Decreased cognition   OT Treatment/Interventions:        OT Goals(Current goals can be found in the care plan section)   Acute Rehab OT Goals OT Goal Formulation: Patient unable to participate in goal setting Time For Goal Achievement: 04/16/24 Potential to Achieve Goals: Fair   OT Frequency:       Co-evaluation PT/OT/SLP Co-Evaluation/Treatment: Yes Reason for Co-Treatment: Complexity of the patient's impairments (multi-system involvement);For patient/therapist safety   OT goals addressed during session: ADL's and self-care      AM-PAC OT 6 Clicks Daily Activity     Outcome Measure Help from another person eating meals?: A Little Help from another person taking care of personal grooming?: A Lot Help from another person toileting, which  includes using toliet, bedpan, or urinal?: A Lot Help from another person bathing (including washing, rinsing, drying)?: A Lot Help from another person to put on and taking off regular upper body clothing?: A Lot Help from another person to put on and taking off regular lower body clothing?: A Lot 6 Click Score: 13   End of Session    Activity Tolerance: Other (comment) (Limited by cognition and pain) Patient left:    OT Visit Diagnosis: Unsteadiness on feet (R26.81);Pain;Other symptoms and signs involving cognitive function Pain - Right/Left: Right Pain - part of body: Leg                Time: 8892-8864 OT Time Calculation (min): 28 min Charges:  OT General Charges $OT Visit: 1 Visit OT Evaluation $OT Eval Moderate Complexity: 1 Mod  04/02/2024  RP, OTR/L  Acute Rehabilitation Services  Office:  [phone]   Charlie JONETTA Halsted 04/02/2024, 12:02 PM

## 2024-04-02 NOTE — Plan of Care (Signed)
   Problem: Elimination: Goal: Will not experience complications related to urinary retention Outcome: Progressing   Problem: Pain Managment: Goal: General experience of comfort will improve and/or be controlled Outcome: Progressing   Problem: Safety: Goal: Ability to remain free from injury will improve Outcome: Progressing

## 2024-04-02 NOTE — Progress Notes (Signed)
 Orthopedic Tech Progress Note Patient Details:  Melody Stone 27-Jul-1951 [id number]  Ortho Devices Type of Ortho Device: Bone foam zero knee Ortho Device/Splint Location: RLE Ortho Device/Splint Interventions: Ordereddropped off BONE FOAM to room, patient was in the chair eating lunch. So I let the tech know when patient should wear bone foam   Post Interventions Patient Tolerated: Well Instructions Provided: Care of device  Melody Stone 04/02/2024, 12:32 PM

## 2024-04-02 NOTE — Hospital Course (Addendum)
 Brief Narrative:  73 year old with history of dementia, cognitive delay, hepatitis C, HTN, HLD, GERD, dysphagia, aortic insufficiency, chronic anemia, frequent falls presented to the hospital for unwitnessed fall at the group home.  [facility] x-ray showed distal femur fracture, orthopedic was consulted.  Patient underwent ORIF on 8/4.   Assessment & Plan:  Principal Problem:   S/P right hip fracture    Unwitnessed fall causing displaced fracture of the right distal femoral metaphysis. -Status post ORIF by orthopedic on 8/4.  Postop management, wound care, weightbearing precautions, pain management and follow-up by surgical team.   GERD PPI   HLD Lipitor   H/o dementia Depression Aricept  and Namenda  Seroquel , Effexor   Renal artery stenosis - Follows outpatient vascular. Plavix  & Statin by outptn Vascular.   Essential hypertension - On verapamil .  IV as needed    DVT prophylaxis: SCD    Code Status: Full Code Family Communication:   Status is: Inpatient Remains inpatient appropriate because: Post Op management per ortho.     Subjective: Seen at bedside.  Overall nonverbal.  Effective communication due to cognitive delay.  Overall appears to be comfortable   Examination:  General exam: Appears calm and comfortable  Respiratory system: Clear to auscultation. Respiratory effort normal. Cardiovascular system: S1 & S2 heard, RRR. No JVD, murmurs, rubs, gallops or clicks. No pedal edema. Gastrointestinal system: Abdomen is nondistended, soft and nontender. No organomegaly or masses felt. Normal bowel sounds heard. Central nervous system: Alert and oriented. No focal neurological deficits. Extremities: Symmetric 5 x 5 power. Skin: Dressing noted Psychiatry: Judgement and insight appear normal. Mood & affect appropriate.

## 2024-04-02 NOTE — TOC CAGE-AID Note (Signed)
 Transition of Care [facility]) - CAGE-AID Screening   Patient Details  Name: Melody Stone MRN: [id number] Date of Birth: 07-Sep-1950  [facility] CM/SW Contact:    Kortland Nichols E Kjersten Ormiston, LCSW Phone Number: 04/02/2024, 8:54 AM   Clinical Narrative:    CAGE-AID Screening: Substance Abuse Screening unable to be completed due to: : Patient unable to participate

## 2024-04-02 NOTE — Progress Notes (Signed)
 Orthopaedic Trauma Service Progress Note  Patient ID: Melody Stone MRN: [id number] DOB/AGE: [birth year] 73 y.o.  Subjective:  No acute issues by report Pt nonverbal No caregiver in room    ROS As above  Today's  total administered Morphine Milligram Equivalents: 3.75 Yesterday's total administered Morphine Milligram Equivalents: 37.5  Objective:   VITALS:   Vitals:   04/01/24 2041 04/01/24 2338 04/02/24 0400 04/02/24 0855  BP: (!) 132/99 (!) 143/65 (!) 145/63 (!) 157/75  Pulse: 93 (!) 109 88   Resp: 20 18 18 18   Temp: 98.3 F (36.8 C) 98 F (36.7 C) 97.8 F (36.6 C)   TempSrc: Oral Oral Oral   SpO2: 100% 91% 93% 98%  Weight:      Height:        Estimated body mass index is 20.59 kg/m as calculated from the following:   Height as of this encounter: 5' 4 (1.626 m).   Weight as of this encounter: 54.4 kg.   Intake/Output      08/04 0701 08/05 0700 08/05 0701 08/06 0700   P.O. 100    IV Piggyback 300    Total Intake(mL/kg) 400 (7.4)    Urine (mL/kg/hr) 400    Blood 20    Total Output 420    Net -20           LABS  Results for orders placed or performed during the hospital encounter of 04/01/24 (from the past 24 hours)  Surgical pcr screen     Status: None   Collection Time: 04/01/24 10:53 AM   Specimen: Nasal Mucosa; Nasal Swab  Result Value Ref Range   MRSA, PCR NEGATIVE NEGATIVE   Staphylococcus aureus NEGATIVE NEGATIVE  CBC     Status: Abnormal   Collection Time: 04/02/24  6:46 AM  Result Value Ref Range   WBC 13.0 (H) 4.0 - 10.5 K/uL   RBC 2.75 (L) 3.87 - 5.11 MIL/uL   Hemoglobin 8.4 (L) 12.0 - 15.0 g/dL   HCT 74.9 (L) 63.9 - 53.9 %   MCV 90.9 80.0 - 100.0 fL   MCH 30.5 26.0 - 34.0 pg   MCHC 33.6 30.0 - 36.0 g/dL   RDW 85.6 88.4 - 84.4 %   Platelets 231 150 - 400 K/uL   nRBC 0.0 0.0 - 0.2 %  Basic metabolic panel     Status: Abnormal   Collection Time: 04/02/24   6:46 AM  Result Value Ref Range   Sodium 136 135 - 145 mmol/L   Potassium 4.5 3.5 - 5.1 mmol/L   Chloride 105 98 - 111 mmol/L   CO2 20 (L) 22 - 32 mmol/L   Glucose, Bld 161 (H) 70 - 99 mg/dL   BUN 31 (H) 8 - 23 mg/dL   Creatinine, Ser 8.99 0.44 - 1.00 mg/dL   Calcium  9.0 8.9 - 10.3 mg/dL   GFR, Estimated 59 (L) >60 mL/min   Anion gap 11 5 - 15  VITAMIN D  25 Hydroxy (Vit-D Deficiency, Fractures)     Status: None   Collection Time: 04/02/24  6:46 AM  Result Value Ref Range   Vit D, 25-Hydroxy 59.32 30 - 100 ng/mL     PHYSICAL EXAM:   Gen: awake and comfortable appearing  Lungs: unlabored Ext:       Right Lower  Extremity   Dressing c/d/I  Ext warm   Minimal swelling   Knee resting in flexion   Difficult to assess motor/sensory function but spontaneously moving toes and ankle      Assessment/Plan: 1 Day Post-Op   Principal Problem:   Closed displaced supracondylar fracture of distal end of right femur without intracondylar extension (HCC)   Anti-infectives (From admission, onward)    Start     Dose/Rate Route Frequency Ordered Stop   04/01/24 2000  ceFAZolin  (ANCEF ) IVPB 2g/100 mL premix        2 g 200 mL/hr over 30 Minutes Intravenous Every 6 hours 04/01/24 1757 04/02/24 0222   04/01/24 1100  ceFAZolin  (ANCEF ) IVPB 2g/100 mL premix        2 g 200 mL/hr over 30 Minutes Intravenous On call to O.R. 04/01/24 1052 04/01/24 1433   04/01/24 1050  ceFAZolin  (ANCEF ) 2-4 GM/100ML-% IVPB       Note to Pharmacy: Coni Sensor M: cabinet override      04/01/24 1050 04/01/24 1451     .  POD/HD#: 1  73 female with history of cognitive delay, dementia amongst other medical issues who resides at a group home s/p with right supracondylar distal femur fracture  - fall   - Right supracondylar distal femur fracture s/p ORIF   Touchdown weightbearing right leg  Therapy evaluations  No motion restrictions  Will get bone foam to help maintain knee extension when not working  on motion  Continue to float heels off bed to prevent pressure sores   Dressing changes as needed starting tomorrow  - Pain management:  Multimodal  Tylenol , minimize narcotics  - ABL anemia/Hemodynamics  Stable, continue to monitor  - Medical issues   Per primary  - DVT/PE prophylaxis:  Okay to resume Plavix   - ID:   Perioperative antibiotics  - Metabolic Bone Disease:  Vitamin D  levels.  Though fracture is suggestive of osteoporosis  - Activity:  As above  - FEN/GI prophylaxis/Foley/Lines:  Feeding assistance,    - Impediments to fracture healing:  Osteoporosis  Cognitive delay which may impact ability to follow weightbearing restrictions  - Dispo:  As above  [facility] consult for SNF     Francis MICAEL Mt, PA-C [phone] (C) 04/02/2024, 10:01 AM  [facility] [address] [place] [address] [phone] (F)    After 5pm and on the weekends please log on to Amion, go to orthopaedics and the look under the [facility] Call for the provider(s) on call. You can also call our office at [phone] and then follow the prompts to be connected to the call team.  Patient ID: Melody Stone, female   DOB: 1951/05/25, 73 y.o.   MRN: [id number]

## 2024-04-02 NOTE — Anesthesia Postprocedure Evaluation (Signed)
 Anesthesia Post Note  Patient: Melody Stone  Procedure(s) Performed: OPEN REDUCTION INTERNAL FIXATION (ORIF) DISTAL FEMUR FRACTURE (Right)     Patient location during evaluation: PACU Anesthesia Type: General Level of consciousness: awake and patient cooperative (at baseline) Pain management: pain level controlled Vital Signs Assessment: post-procedure vital signs reviewed and stable Respiratory status: spontaneous breathing, nonlabored ventilation and respiratory function stable Cardiovascular status: stable and blood pressure returned to baseline Anesthetic complications: no   No notable events documented.  Last Vitals:  Vitals:   04/02/24 1206 04/02/24 1751  BP: 103/78 (!) 143/82  Pulse: 94   Resp: 17 18  Temp:  36.4 C  SpO2: 91% 94%    Last Pain:  Vitals:   04/02/24 1751  TempSrc: Axillary  PainSc:                  Debby FORBES Like

## 2024-04-03 DIAGNOSIS — S72451K Displaced supracondylar fracture without intracondylar extension of lower end of right femur, subsequent encounter for closed fracture with nonunion: Secondary | ICD-10-CM | POA: Diagnosis not present

## 2024-04-03 LAB — CBC
HCT: 21.4 % — ABNORMAL LOW (ref 36.0–46.0)
Hemoglobin: 7.4 g/dL — ABNORMAL LOW (ref 12.0–15.0)
MCH: 30.8 pg (ref 26.0–34.0)
MCHC: 34.6 g/dL (ref 30.0–36.0)
MCV: 89.2 fL (ref 80.0–100.0)
Platelets: 216 K/uL (ref 150–400)
RBC: 2.4 MIL/uL — ABNORMAL LOW (ref 3.87–5.11)
RDW: 14.4 % (ref 11.5–15.5)
WBC: 12.9 K/uL — ABNORMAL HIGH (ref 4.0–10.5)
nRBC: 0 % (ref 0.0–0.2)

## 2024-04-03 LAB — PHOSPHORUS: Phosphorus: 2.5 mg/dL (ref 2.5–4.6)

## 2024-04-03 LAB — BASIC METABOLIC PANEL WITH GFR
Anion gap: 11 (ref 5–15)
BUN: 43 mg/dL — ABNORMAL HIGH (ref 8–23)
CO2: 21 mmol/L — ABNORMAL LOW (ref 22–32)
Calcium: 8.9 mg/dL (ref 8.9–10.3)
Chloride: 103 mmol/L (ref 98–111)
Creatinine, Ser: 1.05 mg/dL — ABNORMAL HIGH (ref 0.44–1.00)
GFR, Estimated: 55 mL/min — ABNORMAL LOW (ref >60)
Glucose, Bld: 108 mg/dL — ABNORMAL HIGH (ref 70–99)
Potassium: 3.8 mmol/L (ref 3.5–5.1)
Sodium: 135 mmol/L (ref 135–145)

## 2024-04-03 LAB — MAGNESIUM: Magnesium: 1.9 mg/dL (ref 1.7–2.4)

## 2024-04-03 MED ORDER — OXYCODONE HCL 5 MG PO TABS: 2.5000 mg | ORAL_TABLET | ORAL | 0 refills | Status: DC | PRN

## 2024-04-03 MED ORDER — ACETAMINOPHEN 500 MG PO TABS: 1000.0000 mg | ORAL_TABLET | Freq: Three times a day (TID) | ORAL | 0 refills | Status: DC | PRN

## 2024-04-03 NOTE — Progress Notes (Signed)
 PROGRESS NOTE    Melody Stone  FMW:[id number] DOB: 1951-08-25 DOA: 04/01/2024 PCP: Cleotilde Oneil FALCON, MD    Brief Narrative:  73 year old with history of dementia, cognitive delay, hepatitis C, HTN, HLD, GERD, dysphagia, aortic insufficiency, chronic anemia, frequent falls presented to the hospital for unwitnessed fall at the group home.  [facility] x-ray showed distal femur fracture, orthopedic was consulted.  Patient underwent ORIF on 8/4.   Assessment & Plan:  Principal Problem:   S/P right hip fracture    Unwitnessed fall causing displaced fracture of the right distal femoral metaphysis. -Status post ORIF by orthopedic on 8/4.  Postop management, wound care, weightbearing precautions, pain management and follow-up by surgical team.   GERD PPI   HLD Lipitor   H/o dementia Depression Aricept  and Namenda  Seroquel , Effexor   Renal artery stenosis - Follows outpatient vascular. Plavix  & Statin by outptn Vascular.   Essential hypertension - On verapamil .  IV as needed    DVT prophylaxis: SCD    Code Status: Full Code Family Communication:   Status is: Inpatient Remains inpatient appropriate because: Post Op management per ortho.     Subjective: Seen at bedside no complaints   Examination:  General exam: Appears calm and comfortable  Respiratory system: Clear to auscultation. Respiratory effort normal. Cardiovascular system: S1 & S2 heard, RRR. No JVD, murmurs, rubs, gallops or clicks. No pedal edema. Gastrointestinal system: Abdomen is nondistended, soft and nontender. No organomegaly or masses felt. Normal bowel sounds heard. Central nervous system: Alert and oriented. No focal neurological deficits. Extremities: Symmetric 5 x 5 power. Skin: Dressing noted, no bleeding Psychiatry: Judgement and insight appear normal. Mood & affect appropriate.                Diet Orders (From admission, onward)     Start     Ordered   04/02/24 1357  DIET DYS 3 Room  service appropriate? Yes; Fluid consistency: Thin  Diet effective now       Comments: Full supervision and assist with feeding  Question Answer Comment  Room service appropriate? Yes   Fluid consistency: Thin      04/02/24 1357            Objective: Vitals:   04/02/24 1751 04/02/24 2012 04/03/24 0503 04/03/24 0738  BP: (!) 143/82 (!) 130/90 115/85 (!) 141/63  Pulse:  (!) 104 92 89  Resp: 18 18 18 16   Temp: 97.6 F (36.4 C) (!) 97.5 F (36.4 C) 98.4 F (36.9 C) (!) 97.3 F (36.3 C)  TempSrc: Axillary Oral Oral Oral  SpO2: 94%  100% 100%  Weight:      Height:        Intake/Output Summary (Last 24 hours) at 04/03/2024 1058 Last data filed at 04/03/2024 0500 Gross per 24 hour  Intake 610 ml  Output 450 ml  Net 160 ml   Filed Weights   04/01/24 1053  Weight: 54.4 kg    Scheduled Meds:  acetaminophen   1,000 mg Oral Q8H   atorvastatin   10 mg Oral Daily   clopidogrel   75 mg Oral Daily   docusate sodium   100 mg Oral BID   donepezil   5 mg Oral QHS   memantine   10 mg Oral BID   methocarbamol   500 mg Oral TID   pantoprazole   40 mg Oral Daily   polyethylene glycol  17 g Oral Daily   QUEtiapine   25 mg Oral QHS   senna-docusate  1 tablet Oral QHS  venlafaxine  XR  37.5 mg Oral Daily   verapamil   180 mg Oral BID   Continuous Infusions:  Nutritional status     Body mass index is 20.59 kg/m.  Data Reviewed:   CBC: Recent Labs  Lab 04/01/24 0736 04/02/24 0646 04/03/24 0707  WBC 16.8* 13.0* 12.9*  NEUTROABS 13.4*  --   --   HGB 10.3* 8.4* 7.4*  HCT 29.8* 25.0* 21.4*  MCV 88.2 90.9 89.2  PLT 247 231 216   Basic Metabolic Panel: Recent Labs  Lab 04/01/24 0736 04/02/24 0646 04/03/24 0707  NA 140 136 135  K 4.1 4.5 3.8  CL 103 105 103  CO2 23 20* 21*  GLUCOSE 105* 161* 108*  BUN 21 31* 43*  CREATININE 0.65 1.00 1.05*  CALCIUM  9.5 9.0 8.9  MG  --   --  1.9  PHOS  --   --  2.5   GFR: Estimated Creatinine Clearance: 39.8 mL/min (A) (by C-G formula  based on SCr of 1.05 mg/dL (H)). Liver Function Tests: Recent Labs  Lab 04/01/24 0736  AST 23  ALT 16  ALKPHOS 90  BILITOT 1.0  PROT 6.5  ALBUMIN 3.5   No results for input(s): LIPASE, AMYLASE in the last 168 hours. No results for input(s): AMMONIA in the last 168 hours. Coagulation Profile: No results for input(s): INR, PROTIME in the last 168 hours. Cardiac Enzymes: No results for input(s): CKTOTAL, CKMB, CKMBINDEX, TROPONINI in the last 168 hours. BNP (last 3 results) No results for input(s): PROBNP in the last 8760 hours. HbA1C: No results for input(s): HGBA1C in the last 72 hours. CBG: No results for input(s): GLUCAP in the last 168 hours. Lipid Profile: No results for input(s): CHOL, HDL, LDLCALC, TRIG, CHOLHDL, LDLDIRECT in the last 72 hours. Thyroid Function Tests: No results for input(s): TSH, T4TOTAL, FREET4, T3FREE, THYROIDAB in the last 72 hours. Anemia Panel: No results for input(s): VITAMINB12, FOLATE, FERRITIN, TIBC, IRON, RETICCTPCT in the last 72 hours. Sepsis Labs: No results for input(s): PROCALCITON, LATICACIDVEN in the last 168 hours.  Recent Results (from the past 240 hours)  Surgical pcr screen     Status: None   Collection Time: 04/01/24 10:53 AM   Specimen: Nasal Mucosa; Nasal Swab  Result Value Ref Range Status   MRSA, PCR NEGATIVE NEGATIVE Final   Staphylococcus aureus NEGATIVE NEGATIVE Final    Comment: (NOTE) The Xpert SA Assay (FDA approved for NASAL specimens in patients 56 years of age and older), is one component of a comprehensive surveillance program. It is not intended to diagnose infection nor to guide or monitor treatment. Performed at [facility], [address] [place], [place] [address]          Radiology Studies: DG Knee Right Port Result Date: 04/01/2024 CLINICAL DATA:  Fracture, postop. EXAM: PORTABLE RIGHT KNEE - 1-2 VIEW COMPARISON:  Preoperative  imaging FINDINGS: Lateral plate and screw fixation of distal femur fracture. Improved fracture alignment from preoperative imaging. Mild residual displacement persists. Recent postsurgical change includes air and edema in the soft tissues. IMPRESSION: ORIF of distal femur fracture with improved fracture alignment from preoperative imaging. Electronically Signed   By: Andrea Gasman M.D.   On: 04/01/2024 17:07   DG FEMUR, MIN 2 VIEWS RIGHT Result Date: 04/01/2024 CLINICAL DATA:  Elective surgery. EXAM: RIGHT FEMUR 2 VIEWS COMPARISON:  Radiograph earlier today FINDINGS: Five fluoroscopic spot views of the right femur submitted from the operating room. Lateral plate and screw fixation of  distal femur fracture. Improved fracture alignment from preoperative imaging. Fluoroscopy time 1 minutes 6 seconds. Dose 4.88 mGy. IMPRESSION: Intraoperative fluoroscopy during ORIF of distal femur fracture. Electronically Signed   By: Andrea Gasman M.D.   On: 04/01/2024 17:06   DG C-Arm 1-60 Min-No Report Result Date: 04/01/2024 Fluoroscopy was utilized by the requesting physician.  No radiographic interpretation.   DG C-Arm 1-60 Min-No Report Result Date: 04/01/2024 Fluoroscopy was utilized by the requesting physician.  No radiographic interpretation.           LOS: 2 days   Time spent= 35 mins    Burgess JAYSON Dare, MD [facility]  If 7PM-7AM, please contact night-coverage  04/03/2024, 10:58 AM

## 2024-04-03 NOTE — Progress Notes (Signed)
 Ortho left Rx on chart for when pt is discharged.

## 2024-04-03 NOTE — TOC Initial Note (Signed)
 Transition of Care [facility]) - Initial/Assessment Note    Patient Details  Name: Melody Stone MRN: [id number] Date of Birth: Aug 26, 1951  [facility]) CM/SW Contact:    Melody LITTIE Moose, LCSW Phone Number: 04/03/2024, 2:28 PM  Clinical Narrative:                 Pt admitted from [facility] due to fall. Pt is nonverbal at baseline and her brother, Melody Stone, is her legal guardian. CSW called Melody Stone to complete SNF workup but the phone went straight to voicemail and CSW was unable to leave a message due to the voice mailbox being full.   Expected Discharge Plan: Skilled Nursing Facility Barriers to Discharge: Continued Medical Work up, SNF Pending bed offer, Insurance Authorization   Patient Goals and CMS Choice Patient states their goals for this hospitalization and ongoing recovery are:: SNF          Expected Discharge Plan and Services       Living arrangements for the past 2 months: Group Home                                      Prior Living Arrangements/Services Living arrangements for the past 2 months: Group Home Lives with:: Facility Resident Patient language and need for interpreter reviewed:: Yes Do you feel safe going back to the place where you live?: Yes      Need for Family Participation in Patient Care: Yes (Comment) Care giver support system in place?: Yes (comment)   Criminal Activity/Legal Involvement Pertinent to Current Situation/Hospitalization: No - Comment as needed  Activities of Daily Living   ADL Screening (condition at time of admission) Independently performs ADLs?: No Is the patient deaf or have difficulty hearing?: No Does the patient have difficulty seeing, even when wearing glasses/contacts?: No Does the patient have difficulty concentrating, remembering, or making decisions?: Yes  Permission Sought/Granted Permission sought to share information with : Family Supports                Emotional  Assessment Appearance:: Appears stated age Attitude/Demeanor/Rapport: Unable to Assess Affect (typically observed): Unable to Assess   Alcohol / Substance Use: Not Applicable Psych Involvement: No (comment)  Admission diagnosis:  S/P right hip fracture [Z87.81] Patient Active Problem List   Diagnosis Date Noted   Closed displaced supracondylar fracture of distal end of right femur without intracondylar extension (HCC) 04/01/2024   Unintentional weight loss 02/07/2023   Renal artery stenosis (HCC) 11/29/2022   Hypertension, 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   Pelvic fracture (HCC) 12/29/2021   Fall    Pubic ramus fracture, right, closed, initial encounter (HCC) 12/28/2021   Mild Alzheimer's dementia (HCC) 07/21/2021   Medicare annual wellness visit, initial 02/27/2019   Benign essential hypertension 08/14/2018   Dysphagia, oropharyngeal 07/08/2016   Lung nodule, solitary 03/30/2016   Moderate aortic insufficiency 03/30/2016   HCV (hepatitis C virus) 02/19/2016   HLD (hyperlipidemia) 02/19/2016   BP (high blood pressure) 02/19/2016   Intellectual disability 02/19/2016   Esophagitis, reflux 02/19/2016   Breathlessness on exertion 10/27/2015   PCP:  Melody Oneil FALCON, MD Pharmacy:   [facility] - [place], [place] - [address] [place] [address] Phone: [phone] Fax: [phone]  [facility] - [place], [place] [address] [place] [address] Phone: [phone] Fax: [phone]  Social Drivers of Health (SDOH) Social History: SDOH Screenings   Food Insecurity: Patient Unable To Answer (04/02/2024)  Housing: Patient Unable To Answer (04/02/2024)  Transportation Needs: Patient Unable To Answer (04/02/2024)  Utilities: Patient Unable To Answer (04/02/2024)  Social Connections: Patient Unable To Answer (04/02/2024)  Tobacco Use: Low Risk  (04/01/2024)   SDOH Interventions:     Readmission Risk Interventions      No data to display

## 2024-04-03 NOTE — Plan of Care (Signed)
 Pt eating about 50% of her meals when fed. Does not appear to be in any pain.

## 2024-04-03 NOTE — Progress Notes (Signed)
 Orthopaedic Trauma Service Progress Note  Patient ID: Melody Stone MRN: [id number] DOB/AGE: Apr 08, 1951 73 y.o.  Subjective:  Ortho issues stable Nurse assisting with feeding  No acute issues of note   Waiting for SNF    ROS As above  Today's  total administered Morphine Milligram Equivalents: 0 Yesterday's total administered Morphine Milligram Equivalents: 3.75  Objective:   VITALS:   Vitals:   04/02/24 1751 04/02/24 2012 04/03/24 0503 04/03/24 0738  BP: (!) 143/82 (!) 130/90 115/85 (!) 141/63  Pulse:  (!) 104 92 89  Resp: 18 18 18 16   Temp: 97.6 F (36.4 C) (!) 97.5 F (36.4 C) 98.4 F (36.9 C) (!) 97.3 F (36.3 C)  TempSrc: Axillary Oral Oral Oral  SpO2: 94%  100% 100%  Weight:      Height:        Estimated body mass index is 20.59 kg/m as calculated from the following:   Height as of this encounter: 5' 4 (1.626 m).   Weight as of this encounter: 54.4 kg.   Intake/Output      08/05 0701 08/06 0700 08/06 0701 08/07 0700   P.O. 710    IV Piggyback     Total Intake(mL/kg) 710 (13.1)    Urine (mL/kg/hr) 450 (0.3)    Blood     Total Output 450    Net +260           LABS  Results for orders placed or performed during the hospital encounter of 04/01/24 (from the past 24 hours)  CBC     Status: Abnormal   Collection Time: 04/03/24  7:07 AM  Result Value Ref Range   WBC 12.9 (H) 4.0 - 10.5 K/uL   RBC 2.40 (L) 3.87 - 5.11 MIL/uL   Hemoglobin 7.4 (L) 12.0 - 15.0 g/dL   HCT 78.5 (L) 63.9 - 53.9 %   MCV 89.2 80.0 - 100.0 fL   MCH 30.8 26.0 - 34.0 pg   MCHC 34.6 30.0 - 36.0 g/dL   RDW 85.5 88.4 - 84.4 %   Platelets 216 150 - 400 K/uL   nRBC 0.0 0.0 - 0.2 %  Basic metabolic panel     Status: Abnormal   Collection Time: 04/03/24  7:07 AM  Result Value Ref Range   Sodium 135 135 - 145 mmol/L   Potassium 3.8 3.5 - 5.1 mmol/L   Chloride 103 98 - 111 mmol/L   CO2 21 (L) 22 -  32 mmol/L   Glucose, Bld 108 (H) 70 - 99 mg/dL   BUN 43 (H) 8 - 23 mg/dL   Creatinine, Ser 8.94 (H) 0.44 - 1.00 mg/dL   Calcium  8.9 8.9 - 10.3 mg/dL   GFR, Estimated 55 (L) >60 mL/min   Anion gap 11 5 - 15  Magnesium     Status: None   Collection Time: 04/03/24  7:07 AM  Result Value Ref Range   Magnesium 1.9 1.7 - 2.4 mg/dL  Phosphorus     Status: None   Collection Time: 04/03/24  7:07 AM  Result Value Ref Range   Phosphorus 2.5 2.5 - 4.6 mg/dL     PHYSICAL EXAM:   Gen: awake and comfortable appearing. Tolerating bone foam  Lungs: unlabored Ext:       Right Lower Extremity  Dressing c/d/I   Dressings removed   Incisions look great    New mepilex applied              Ext warm              Minimal swelling              Difficult to assess motor/sensory function but spontaneously moving toes and ankle                Assessment/Plan: 2 Days Post-Op   Principal Problem:   Closed displaced supracondylar fracture of distal end of right femur without intracondylar extension (HCC)   Anti-infectives (From admission, onward)    Start     Dose/Rate Route Frequency Ordered Stop   04/01/24 2000  ceFAZolin  (ANCEF ) IVPB 2g/100 mL premix        2 g 200 mL/hr over 30 Minutes Intravenous Every 6 hours 04/01/24 1757 04/02/24 0222   04/01/24 1100  ceFAZolin  (ANCEF ) IVPB 2g/100 mL premix        2 g 200 mL/hr over 30 Minutes Intravenous On call to O.R. 04/01/24 1052 04/01/24 1433   04/01/24 1050  ceFAZolin  (ANCEF ) 2-4 GM/100ML-% IVPB       Note to Pharmacy: Coni Sensor M: cabinet override      04/01/24 1050 04/01/24 1451     .  POD/HD#: 2  40 female with history of cognitive delay, dementia amongst other medical issues who resides at a group home s/p with right supracondylar distal femur fracture   - fall    - Right supracondylar distal femur fracture s/p ORIF              NWB R LEX              Therapy evaluations             No motion restrictions              Will get bone foam to help maintain knee extension when not working on motion             Continue to float heels off bed to prevent pressure sores               Dressing changed today    Ok to change as needed   Ok to shower and clean wounds with soap and water    - Pain management:             Multimodal             Tylenol , minimize narcotics     - Medical issues              Per primary   - DVT/PE prophylaxis:             Home Plavix    - ID:              Perioperative antibiotics   - Metabolic Bone Disease:             Vitamin D  levels normal, Though fracture is suggestive of osteoporosis   - Activity:             As above   - FEN/GI prophylaxis/Foley/Lines:             Feeding assistance     - Impediments to fracture healing:             Osteoporosis  Cognitive delay which may impact ability to follow weightbearing restrictions   - Dispo:             As above             TOC consult for SNF   Ortho issues stable  Follow up in 10-14 days with ortho trauma service    Francis MICAEL Mt, PA-C [phone] (C) 04/03/2024, 10:28 AM  [facility] [address] [place] [address] [phone] (F)    After 5pm and on the weekends please log on to Amion, go to orthopaedics and the look under the [facility] Call for the provider(s) on call. You can also call our office at [phone] and then follow the prompts to be connected to the call team.  Patient ID: Melody Stone, female   DOB: [birth year], 73 y.o.   MRN: [id number]

## 2024-04-04 DIAGNOSIS — S72451K Displaced supracondylar fracture without intracondylar extension of lower end of right femur, subsequent encounter for closed fracture with nonunion: Secondary | ICD-10-CM | POA: Diagnosis not present

## 2024-04-04 LAB — ABO/RH: ABO/RH(D): O POS

## 2024-04-04 LAB — BASIC METABOLIC PANEL WITH GFR
Anion gap: 7 (ref 5–15)
BUN: 39 mg/dL — ABNORMAL HIGH (ref 8–23)
CO2: 25 mmol/L (ref 22–32)
Calcium: 8.7 mg/dL — ABNORMAL LOW (ref 8.9–10.3)
Chloride: 107 mmol/L (ref 98–111)
Creatinine, Ser: 0.76 mg/dL (ref 0.44–1.00)
GFR, Estimated: >60 mL/min (ref >60)
Glucose, Bld: 104 mg/dL — ABNORMAL HIGH (ref 70–99)
Potassium: 3.8 mmol/L (ref 3.5–5.1)
Sodium: 139 mmol/L (ref 135–145)

## 2024-04-04 LAB — CBC
HCT: 18.6 % — ABNORMAL LOW (ref 36.0–46.0)
Hemoglobin: 6.2 g/dL — CL (ref 12.0–15.0)
MCH: 30.4 pg (ref 26.0–34.0)
MCHC: 33.3 g/dL (ref 30.0–36.0)
MCV: 91.2 fL (ref 80.0–100.0)
Platelets: 176 K/uL (ref 150–400)
RBC: 2.04 MIL/uL — ABNORMAL LOW (ref 3.87–5.11)
RDW: 14.6 % (ref 11.5–15.5)
WBC: 10 K/uL (ref 4.0–10.5)
nRBC: 0 % (ref 0.0–0.2)

## 2024-04-04 LAB — PREPARE RBC (CROSSMATCH)

## 2024-04-04 LAB — MAGNESIUM: Magnesium: 2 mg/dL (ref 1.7–2.4)

## 2024-04-04 MED ORDER — SODIUM CHLORIDE 0.9% IV SOLUTION: Freq: Once | INTRAVENOUS | Status: AC

## 2024-04-04 NOTE — TOC Progression Note (Addendum)
 Transition of Care [facility]) - Progression Note    Patient Details  Name: Melody Stone MRN: [id number] Date of Birth: 04-10-1951  [facility]) CM/SW Contact  Shekira Drummer LITTIE Moose, LCSW Phone Number: 04/04/2024, 1:50 PM  Clinical Narrative:    CSW spoke with pt brother, [name] who is also her legal guardian. CSW completed SNF workup with [name]. [name] is agreeable for pt to go to SNF when medically ready. CSW completed Fl2 and sent out referrals- PASRR needs uploading.    Expected Discharge Plan: Skilled Nursing Facility Barriers to Discharge: Continued Medical Work up, SNF Pending bed offer               Expected Discharge Plan and Services     Post Acute Care Choice: Skilled Nursing Facility Living arrangements for the past 2 months: Group Home                                       Social Drivers of Health (SDOH) Interventions SDOH Screenings   Food Insecurity: Patient Unable To Answer (04/02/2024)  Housing: Patient Unable To Answer (04/02/2024)  Transportation Needs: Patient Unable To Answer (04/02/2024)  Utilities: Patient Unable To Answer (04/02/2024)  Social Connections: Patient Unable To Answer (04/02/2024)  Tobacco Use: Low Risk  (04/01/2024)    Readmission Risk Interventions     No data to display

## 2024-04-04 NOTE — Plan of Care (Signed)
  Problem: Elimination: Goal: Will not experience complications related to urinary retention Outcome: Progressing   Problem: Pain Managment: Goal: General experience of comfort will improve and/or be controlled Outcome: Progressing

## 2024-04-04 NOTE — Progress Notes (Signed)
    Patient Name: Melody Stone           DOB: 04/02/1951  MRN: [id number]      Admission Date: 04/01/2024  Attending Provider: Caleen Burgess BROCKS, MD  Primary Diagnosis: Closed displaced supracondylar fracture of distal end of right femur without intracondylar extension [facility])   Level of care: Med-Surg    CROSS COVER NOTE   Date of Service   04/04/2024   Melody Stone, 73 y.o. female, was admitted on 04/01/2024 for Closed displaced supracondylar fracture of distal end of right femur without intracondylar extension (HCC).    HPI/Events of Note   Acute Blood Loss Anemia Hemoglobin 7.4 -->  6.2.  No acute changes reported.  Hemodynamically stable. No melena, hematochezia, or other bleeding reported tonight.   Status post ORIF by orthopedic on 8/4. Surgical site clean and dry per RN.   Interventions/ Plan   Blood transfusion, 1 unit PRBC Recheck H&H after transfusion is completed, transfuse if HGB <7        Lavanda Horns, DNP, ACNPC- AG [facility] [place]

## 2024-04-04 NOTE — Progress Notes (Signed)
   04/04/24 9341  Provider Notification  Provider Name/Title A. Andrez, NP  Date Provider Notified 04/04/24  Time Provider Notified [phone]  Method of Notification Page (secure chat)  Notification Reason Critical Result  Test performed and critical result Hemoglobin 6.2  Date Critical Result Received 04/04/24  Time Critical Result Received 0656  Provider response Evaluate remotely;See new orders  Date of Provider Response 04/04/24  Time of Provider Response [phone]

## 2024-04-04 NOTE — Progress Notes (Signed)
 Blood transfusion completed. Dr. Caleen stated no post H/H needed for today but will get the previously ordered cbc tomorrow am.

## 2024-04-04 NOTE — Care Management Important Message (Signed)
 Important Message  Patient Details  Name: Melody Stone MRN: [id number] Date of Birth: 26-May-1951   Important Message Given:  Yes - Medicare IM     Jon Cruel 04/04/2024, 12:29 PM

## 2024-04-04 NOTE — Progress Notes (Signed)
 RE: Melody Stone Date of Birth: [birth year] Date: 04/04/24  Please be advised that the above-named patient will require a short-term nursing home stay - anticipated 30 days or less for rehabilitation and strengthening.  The plan is for return home.

## 2024-04-04 NOTE — Progress Notes (Signed)
 PROGRESS NOTE    Melody Stone  FMW:[id number] DOB: 08-23-1951 DOA: 04/01/2024 PCP: Cleotilde Oneil FALCON, MD    Brief Narrative:  73 year old with history of dementia, cognitive delay, hepatitis C, HTN, HLD, GERD, dysphagia, aortic insufficiency, chronic anemia, frequent falls presented to the hospital for unwitnessed fall at the group home.  [facility] x-ray showed distal femur fracture, orthopedic was consulted.  Patient underwent ORIF on 8/4.  Postop PT/OT recommended SNF.  Developed postop acute blood loss anemia requiring 1 unit PRBC transfusion. Currently awaiting placement.   Assessment & Plan:  Principal Problem:   S/P right hip fracture    Unwitnessed fall causing displaced fracture of the right distal femoral metaphysis. -Status post ORIF by orthopedic on 8/4.  Postop management, wound care, weightbearing precautions, pain management and follow-up by surgical team.  Acute blood loss anemia - Likely postop.  No obvious signs of bleeding.  Hemoglobin 6.2, 1 unit PRBC ordered   GERD PPI   HLD Lipitor   H/o dementia Depression Aricept  and Namenda  Seroquel , Effexor   Renal artery stenosis - Follows outpatient vascular. Plavix  & Statin by outptn Vascular.   Essential hypertension - On verapamil .  IV as needed    DVT prophylaxis: SCD    Code Status: Full Code Family Communication:   Status is: Inpatient Foley discharge tomorrow if hemoglobin remained stable  Subjective: No complaints. Doing ok.  No obvious signs of blood loss  Examination:  General exam: Appears calm and comfortable, difficult for her to communicate due to cognitive impairment Respiratory system: Clear to auscultation. Respiratory effort normal. Cardiovascular system: S1 & S2 heard, RRR. No JVD, murmurs, rubs, gallops or clicks. No pedal edema. Gastrointestinal system: Abdomen is nondistended, soft and nontender. No organomegaly or masses felt. Normal bowel sounds heard. Central nervous system:  Difficult to assess due to her cognitive impairment.  Grossly moving all the extremities Extremities: Symmetric 5 x 5 power. Skin: Dressing noted, no bleeding Psychiatry: Judgement and insight appea poor.                 Diet Orders (From admission, onward)     Start     Ordered   04/02/24 1357  DIET DYS 3 Room service appropriate? Yes; Fluid consistency: Thin  Diet effective now       Comments: Full supervision and assist with feeding  Question Answer Comment  Room service appropriate? Yes   Fluid consistency: Thin      04/02/24 1357            Objective: Vitals:   04/04/24 0452 04/04/24 0456 04/04/24 0818 04/04/24 0851  BP: (!) 143/89 (!) 131/54 131/76 131/76  Pulse: (!) 107 71 68   Resp: 16 16 16    Temp: 98 F (36.7 C) 98.5 F (36.9 C) 97.6 F (36.4 C)   TempSrc:  Oral Oral   SpO2: 96% 100% 100%   Weight:      Height:        Intake/Output Summary (Last 24 hours) at 04/04/2024 1137 Last data filed at 04/04/2024 0900 Gross per 24 hour  Intake 900 ml  Output --  Net 900 ml   Filed Weights   04/01/24 1053  Weight: 54.4 kg    Scheduled Meds:  sodium chloride    Intravenous Once   acetaminophen   1,000 mg Oral Q8H   atorvastatin   10 mg Oral Daily   clopidogrel   75 mg Oral Daily   docusate sodium   100 mg Oral BID   donepezil   5  mg Oral QHS   memantine   10 mg Oral BID   methocarbamol   500 mg Oral TID   pantoprazole   40 mg Oral Daily   polyethylene glycol  17 g Oral Daily   QUEtiapine   25 mg Oral QHS   senna-docusate  1 tablet Oral QHS   venlafaxine  XR  37.5 mg Oral Daily   verapamil   180 mg Oral BID   Continuous Infusions:  Nutritional status     Body mass index is 20.59 kg/m.  Data Reviewed:   CBC: Recent Labs  Lab 04/01/24 0736 04/02/24 0646 04/03/24 0707 04/04/24 0607  WBC 16.8* 13.0* 12.9* 10.0  NEUTROABS 13.4*  --   --   --   HGB 10.3* 8.4* 7.4* 6.2*  HCT 29.8* 25.0* 21.4* 18.6*  MCV 88.2 90.9 89.2 91.2  PLT 247 231 216 176    Basic Metabolic Panel: Recent Labs  Lab 04/01/24 0736 04/02/24 0646 04/03/24 0707 04/04/24 0607  NA 140 136 135 139  K 4.1 4.5 3.8 3.8  CL 103 105 103 107  CO2 23 20* 21* 25  GLUCOSE 105* 161* 108* 104*  BUN 21 31* 43* 39*  CREATININE 0.65 1.00 1.05* 0.76  CALCIUM  9.5 9.0 8.9 8.7*  MG  --   --  1.9 2.0  PHOS  --   --  2.5  --    GFR: Estimated Creatinine Clearance: 52.2 mL/min (by C-G formula based on SCr of 0.76 mg/dL). Liver Function Tests: Recent Labs  Lab 04/01/24 0736  AST 23  ALT 16  ALKPHOS 90  BILITOT 1.0  PROT 6.5  ALBUMIN 3.5   No results for input(s): LIPASE, AMYLASE in the last 168 hours. No results for input(s): AMMONIA in the last 168 hours. Coagulation Profile: No results for input(s): INR, PROTIME in the last 168 hours. Cardiac Enzymes: No results for input(s): CKTOTAL, CKMB, CKMBINDEX, TROPONINI in the last 168 hours. BNP (last 3 results) No results for input(s): PROBNP in the last 8760 hours. HbA1C: No results for input(s): HGBA1C in the last 72 hours. CBG: No results for input(s): GLUCAP in the last 168 hours. Lipid Profile: No results for input(s): CHOL, HDL, LDLCALC, TRIG, CHOLHDL, LDLDIRECT in the last 72 hours. Thyroid Function Tests: No results for input(s): TSH, T4TOTAL, FREET4, T3FREE, THYROIDAB in the last 72 hours. Anemia Panel: No results for input(s): VITAMINB12, FOLATE, FERRITIN, TIBC, IRON, RETICCTPCT in the last 72 hours. Sepsis Labs: No results for input(s): PROCALCITON, LATICACIDVEN in the last 168 hours.  Recent Results (from the past 240 hours)  Surgical pcr screen     Status: None   Collection Time: 04/01/24 10:53 AM   Specimen: Nasal Mucosa; Nasal Swab  Result Value Ref Range Status   MRSA, PCR NEGATIVE NEGATIVE Final   Staphylococcus aureus NEGATIVE NEGATIVE Final    Comment: (NOTE) The Xpert SA Assay (FDA approved for NASAL specimens in patients  54 years of age and older), is one component of a comprehensive surveillance program. It is not intended to diagnose infection nor to guide or monitor treatment. Performed at [facility], [address], [place], [place] [address]          Radiology Studies: No results found.         LOS: 3 days   Time spent= 35 mins    Burgess JAYSON Dare, MD [facility]  If 7PM-7AM, please contact night-coverage  04/04/2024, 11:37 AM

## 2024-04-04 NOTE — Progress Notes (Signed)
 Physical Therapy Treatment Patient Details Name: Melody Stone MRN: [id number] DOB: 05-Feb-1951 Today's Date: 04/04/2024   History of Present Illness Pt is 73 yo female who presents on 04/01/24 after unwitnessed fall at group home resulting in R hip fx. Underwent R femur ORIF. PMH: dementia, cognitive delay, hep C, HTN, HLD, GERD, dysphagia, anemia, aortic insufficiency.    PT Comments  Pt just finished blood transfusion.  Performed OOB to recliner via squat pivot to her L side.  Pt is fearful of situation d/t pain and baseline cognition.  Pt sitting in recliner with alarm set and performed B LE strengthening with intermittent participation.  Pt continues to benefit from longer duration post acute rehab to improve strength and function and decrease caregiver burden.      If plan is discharge home, recommend the following: A lot of help with walking and/or transfers;A lot of help with bathing/dressing/bathroom;Assist for transportation   Can travel by private vehicle     No  Equipment Recommendations  Wheelchair (measurements PT);Wheelchair cushion (measurements PT)    Recommendations for Other Services       Precautions / Restrictions Precautions Precautions: Fall Recall of Precautions/Restrictions: Impaired Precaution/Restrictions Comments: bone foam to help maintain knee extension when not working on motion. Required Braces or Orthoses: Other Brace Other Brace: bone foam (not in room yet) Restrictions Weight Bearing Restrictions Per Provider Order: Yes RLE Weight Bearing Per Provider Order: Touchdown weight bearing Other Position/Activity Restrictions: Patient does not have the cognitive capacity to follow TWB     Mobility  Bed Mobility Overal bed mobility: Needs Assistance Bed Mobility: Supine to Sit     Supine to sit: Max assist, +2 for physical assistance     General bed mobility comments: Required assistance for LE advancement to edge of bed and trunk elevation from bed  to sitting at edge of bed.  Presents with posterior pelvic tilt and posterior bias.  Pt swatting and attempting to pinch but able to redirect.    Transfers Overall transfer level: Needs assistance Equipment used: 2 person hand held assist Transfers: Bed to chair/wheelchair/BSC       Squat pivot transfers: Max assist, Total assist, +2 safety/equipment     General transfer comment: Pt unable to comprehend weight bearing status but did initaite movement from bed to recliner.  Lifted to avoid weight bearing and performed pivot to her L side to maintain weight bearing.    Ambulation/Gait Ambulation/Gait assistance:  (unable.)                 Stairs             Wheelchair Mobility     Tilt Bed    Modified Rankin (Stroke Patients Only)       Balance Overall balance assessment: Needs assistance Sitting-balance support: Feet supported Sitting balance-Leahy Scale: Poor Sitting balance - Comments: posterior lean until brought to EOB and then pt could maintain upright sitting with supervision Postural control: Posterior lean   Standing balance-Leahy Scale: Poor Standing balance comment: reliant on 2 person support                            Communication Communication Communication: Impaired Factors Affecting Communication: Difficulty expressing self  Cognition Arousal: Alert Behavior During Therapy: Anxious, Agitated   PT - Cognitive impairments: History of cognitive impairments  PT - Cognition Comments: does not follow verbal comands well but does follow visual and gestural cues about 25% of time.  Mild combativeness. Following commands: Impaired Following commands impaired: Follows one step commands inconsistently    Cueing Cueing Techniques: Verbal cues, Tactile cues, Gestural cues  Exercises General Exercises - Lower Extremity Long Arc Quad: AAROM, Both, 10 reps, Seated, PROM Heel Slides: AAROM, Both, 10 reps,  PROM, Supine Hip ABduction/ADduction: AAROM, Both, 10 reps, PROM, Supine    General Comments        Pertinent Vitals/Pain Pain Assessment Pain Assessment: Faces Faces Pain Scale: Hurts little more Pain Location: R leg- generalized ( no rhyme or reason to when she screams) Pain Descriptors / Indicators: Grimacing Pain Intervention(s): Monitored during session, Repositioned    Home Living                          Prior Function            PT Goals (current goals can now be found in the care plan section) Acute Rehab PT Goals Patient Stated Goal: none stated Potential to Achieve Goals: Fair Progress towards PT goals: Progressing toward goals    Frequency    Min 1X/week      PT Plan      Co-evaluation              AM-PAC PT 6 Clicks Mobility   Outcome Measure  Help needed turning from your back to your side while in a flat bed without using bedrails?: A Lot Help needed moving from lying on your back to sitting on the side of a flat bed without using bedrails?: A Lot Help needed moving to and from a bed to a chair (including a wheelchair)?: A Lot Help needed standing up from a chair using your arms (e.g., wheelchair or bedside chair)?: Total Help needed to walk in hospital room?: Total Help needed climbing 3-5 steps with a railing? : Total 6 Click Score: 9    End of Session Equipment Utilized During Treatment: Gait belt Activity Tolerance: Treatment limited secondary to agitation;Patient limited by pain Patient left: in chair;with call bell/phone within reach;with chair alarm set (sitter alarm belt in place) Nurse Communication: Mobility status PT Visit Diagnosis: Repeated falls (R29.6);Difficulty in walking, not elsewhere classified (R26.2);Pain Pain - Right/Left: Right Pain - part of body: Leg     Time: 8462-8384 PT Time Calculation (min) (ACUTE ONLY): 38 min  Charges:    $Therapeutic Exercise: 8-22 mins $Therapeutic Activity: 23-37  mins PT General Charges $$ ACUTE PT VISIT: 1 Visit                     Toya HAMS , PTA Acute Rehabilitation Services Office [phone]    Toya JINNY Gosling 04/04/2024, 4:29 PM

## 2024-04-04 NOTE — Progress Notes (Signed)
 Once blood started, lost IV site and Iv team came and started a new site. 15 min vitals based on the timing of 15 post transfusion start in new site.

## 2024-04-04 NOTE — [place] FL2 (Signed)
   MEDICAID FL2 LEVEL OF CARE FORM     IDENTIFICATION  Patient Name: Melody Stone Birthdate: 1951-06-27 Sex: female Admission Date (Current Location): 04/01/2024  [facility] [place] [facility] and [name] Number:  Chiropodist and Address:  [facility] [place], [address], [place], [place] [address]      Provider Number: [id number]  Attending Physician Name and Address:  Caleen Burgess BROCKS, MD  Relative Name and Phone Number:       Current Level of Care: Hospital Recommended Level of Care: Skilled Nursing Facility Prior Approval Number:    Date Approved/Denied:   PASRR Number:    Discharge Plan: SNF    Current Diagnoses: Patient Active Problem List   Diagnosis Date Noted   Closed displaced supracondylar fracture of distal end of right femur without intracondylar extension (HCC) 04/01/2024   Unintentional weight loss 02/07/2023   Renal artery stenosis (HCC) 11/29/2022   Hypertension, 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   Pelvic fracture (HCC) 12/29/2021   Fall    Pubic ramus fracture, right, closed, initial encounter (HCC) 12/28/2021   Mild Alzheimer's dementia (HCC) 07/21/2021   Medicare annual wellness visit, initial 02/27/2019   Benign essential hypertension 08/14/2018   Dysphagia, oropharyngeal 07/08/2016   Lung nodule, solitary 03/30/2016   Moderate aortic insufficiency 03/30/2016   HCV (hepatitis C virus) 02/19/2016   HLD (hyperlipidemia) 02/19/2016   BP (high blood pressure) 02/19/2016   Intellectual disability 02/19/2016   Esophagitis, reflux 02/19/2016   Breathlessness on exertion 10/27/2015    Orientation RESPIRATION BLADDER Height & Weight      (Cognitively impaired)  Normal Incontinent Weight: 119 lb 14.9 oz (54.4 kg) Height:  5' 4 (162.6 cm)  BEHAVIORAL SYMPTOMS/MOOD NEUROLOGICAL BOWEL NUTRITION STATUS      Continent Diet (See DC Summary)  AMBULATORY STATUS COMMUNICATION OF NEEDS Skin   Extensive Assist Does not communicate Other (Comment)  (Closed surgical incision on right hip; wound on face)                       Personal Care Assistance Level of Assistance  Bathing, Feeding, Dressing Bathing Assistance: Maximum assistance Feeding assistance: Limited assistance Dressing Assistance: Maximum assistance     Functional Limitations Info  Sight, Speech, Hearing Sight Info: Adequate Hearing Info: Adequate Speech Info: Impaired    SPECIAL CARE FACTORS FREQUENCY  PT (By licensed PT), OT (By licensed OT)     PT Frequency: 5x/week OT Frequency: 5x/week            Contractures Contractures Info: Not present    Additional Factors Info  Allergies, Code Status Code Status Info: Full Allergies Info: Ciproflaxcin; Psychotropic drugs cause kidney failure           Current Medications (04/04/2024):  This is the current hospital active medication list Current Facility-Administered Medications  Medication Dose Route Frequency Provider Last Rate Last Admin   acetaminophen  (TYLENOL ) tablet 1,000 mg  1,000 mg Oral Q8H Deward Eck, PA-C   1,000 mg at 04/04/24 9370   acetaminophen  (TYLENOL ) tablet 325-650 mg  325-650 mg Oral Q6H PRN Deward Eck, PA-C       atorvastatin  (LIPITOR) tablet 10 mg  10 mg Oral Daily Deward Eck, PA-C   10 mg at 04/04/24 0851   clopidogrel  (PLAVIX ) tablet 75 mg  75 mg Oral Daily Deward Eck, PA-C   75 mg at 04/04/24 9148   docusate sodium  (COLACE) capsule 100 mg  100 mg Oral BID Deward Eck, PA-C  100 mg at 04/04/24 9148   donepezil  (ARICEPT ) tablet 5 mg  5 mg Oral QHS Deward Eck, PA-C   5 mg at 04/03/24 2053   hydrALAZINE  (APRESOLINE ) injection 10 mg  10 mg Intravenous Q4H PRN Amin, Ankit C, MD       HYDROmorphone  (DILAUDID ) injection 0.5-1 mg  0.5-1 mg Intravenous Q4H PRN Deward Eck, PA-C       ipratropium-albuterol  (DUONEB) 0.5-2.5 (3) MG/3ML nebulizer solution 3 mL  3 mL Nebulization Q4H PRN Amin, Ankit C, MD       memantine  (NAMENDA ) tablet 10 mg  10 mg Oral BID Deward Eck, PA-C   10 mg  at 04/04/24 9148   menthol -cetylpyridinium (CEPACOL) lozenge 3 mg  1 lozenge Oral PRN Deward Eck, PA-C       Or   phenol (CHLORASEPTIC) mouth spray 1 spray  1 spray Mouth/Throat PRN Deward Eck, PA-C       methocarbamol  (ROBAXIN ) tablet 500 mg  500 mg Oral TID Deward Eck, PA-C   500 mg at 04/04/24 9147   metoCLOPramide  (REGLAN ) tablet 5-10 mg  5-10 mg Oral Q8H PRN Deward Eck, PA-C       Or   metoCLOPramide  (REGLAN ) injection 5-10 mg  5-10 mg Intravenous Q8H PRN Deward Eck, PA-C       metoprolol  tartrate (LOPRESSOR ) injection 5 mg  5 mg Intravenous Q4H PRN Amin, Ankit C, MD       ondansetron  (ZOFRAN ) tablet 4 mg  4 mg Oral Q6H PRN Deward Eck, PA-C       Or   ondansetron  (ZOFRAN ) injection 4 mg  4 mg Intravenous Q6H PRN Deward Eck, PA-C       oxyCODONE  (Oxy IR/ROXICODONE ) immediate release tablet 2.5 mg  2.5 mg Oral Q4H PRN Deward Eck, PA-C   2.5 mg at 04/02/24 0152   oxyCODONE  (Oxy IR/ROXICODONE ) immediate release tablet 5 mg  5 mg Oral Q4H PRN Deward Eck, PA-C   5 mg at 04/04/24 0853   pantoprazole  (PROTONIX ) EC tablet 40 mg  40 mg Oral Daily Deward Eck, PA-C   40 mg at 04/04/24 9148   polyethylene glycol (MIRALAX  / GLYCOLAX ) packet 17 g  17 g Oral Daily Deward Eck, PA-C   17 g at 04/04/24 0848   QUEtiapine  (SEROQUEL ) tablet 25 mg  25 mg Oral QHS Deward Eck, PA-C   25 mg at 04/03/24 2053   senna-docusate (Senokot-S) tablet 1 tablet  1 tablet Oral QHS Deward Eck, PA-C   1 tablet at 04/03/24 2051   senna-docusate (Senokot-S) tablet 1 tablet  1 tablet Oral QHS PRN Amin, Ankit C, MD       traZODone  (DESYREL ) tablet 50 mg  50 mg Oral QHS PRN Amin, Ankit C, MD       venlafaxine  XR (EFFEXOR -XR) 24 hr capsule 37.5 mg  37.5 mg Oral Daily Deward Eck, PA-C   37.5 mg at 04/04/24 9148   verapamil  (CALAN -SR) CR tablet 180 mg  180 mg Oral BID Deward Eck, PA-C   180 mg at 04/04/24 9148     Discharge Medications: Please see discharge summary for a list of discharge medications.  Relevant  Imaging Results:  Relevant Lab Results:   Additional Information SSN: [id number]  Jeoffrey LITTIE Moose, LCSW

## 2024-04-04 NOTE — Progress Notes (Signed)
 PT Cancellation Note  Patient Details Name: Melody Stone MRN: [id number] DOB: [birth year]   Cancelled Treatment:    Reason Eval/Treat Not Completed: (P) Medical issues which prohibited therapy (HGB low at 6.2 awaiting blood transfusion, will defer PT at this time.)   Tashema Tiller J Cashawn Yanko 04/04/2024, 11:09 AM  Toya HAMS , PTA Acute Rehabilitation Services Office [phone]

## 2024-04-05 ENCOUNTER — Other Ambulatory Visit: Payer: Self-pay

## 2024-04-05 DIAGNOSIS — S72451A Displaced supracondylar fracture without intracondylar extension of lower end of right femur, initial encounter for closed fracture: Secondary | ICD-10-CM

## 2024-04-05 LAB — MAGNESIUM: Magnesium: 1.9 mg/dL (ref 1.7–2.4)

## 2024-04-05 LAB — CBC
HCT: 23.6 % — ABNORMAL LOW (ref 36.0–46.0)
Hemoglobin: 8 g/dL — ABNORMAL LOW (ref 12.0–15.0)
MCH: 30.1 pg (ref 26.0–34.0)
MCHC: 33.9 g/dL (ref 30.0–36.0)
MCV: 88.7 fL (ref 80.0–100.0)
Platelets: 186 K/uL (ref 150–400)
RBC: 2.66 MIL/uL — ABNORMAL LOW (ref 3.87–5.11)
RDW: 16 % — ABNORMAL HIGH (ref 11.5–15.5)
WBC: 10.5 K/uL (ref 4.0–10.5)
nRBC: 0.2 % (ref 0.0–0.2)

## 2024-04-05 LAB — BPAM RBC
Blood Product Expiration Date: 202508312359
ISSUE DATE / TIME: 202508071219
Unit Type and Rh: 5100

## 2024-04-05 LAB — TYPE AND SCREEN
ABO/RH(D): O POS
Antibody Screen: NEGATIVE
Unit division: 0

## 2024-04-05 LAB — BASIC METABOLIC PANEL WITH GFR
Anion gap: 8 (ref 5–15)
BUN: 44 mg/dL — ABNORMAL HIGH (ref 8–23)
CO2: 25 mmol/L (ref 22–32)
Calcium: 9 mg/dL (ref 8.9–10.3)
Chloride: 109 mmol/L (ref 98–111)
Creatinine, Ser: 0.72 mg/dL (ref 0.44–1.00)
GFR, Estimated: >60 mL/min (ref >60)
Glucose, Bld: 105 mg/dL — ABNORMAL HIGH (ref 70–99)
Potassium: 4.2 mmol/L (ref 3.5–5.1)
Sodium: 142 mmol/L (ref 135–145)

## 2024-04-05 NOTE — Plan of Care (Signed)
  Problem: Pain Managment: Goal: General experience of comfort will improve and/or be controlled Outcome: Progressing   Problem: Safety: Goal: Ability to remain free from injury will improve Outcome: Progressing

## 2024-04-05 NOTE — Progress Notes (Signed)
 PROGRESS NOTE  Melody Stone  FMW:[id number] DOB: 11-21-1950 DOA: 04/01/2024 PCP: Cleotilde Oneil FALCON, MD  Consultants  Brief Narrative: 73 year old with history of dementia, cognitive delay, hepatitis C, HTN, HLD, GERD, dysphagia, aortic insufficiency, chronic anemia, frequent falls presented to the hospital for unwitnessed fall at the group home.  [facility] x-ray showed distal femur fracture, orthopedic was consulted.  Patient underwent ORIF on 8/4.  Postop PT/OT recommended SNF.  Developed postop acute blood loss anemia requiring 1 unit PRBC transfusion. 8/8: Hemoglobin improved status post 1 unit packed red blood cell.     Assessment & Plan:  Principal Problem:   S/P right hip fracture    Acute blood loss anemia - Likely postop.  No obvious signs of bleeding/melena.  Hemoglobin 6.2, 1 unit PRBC ordered - Downtrending for several days.  Status post 1 unit PRBC 8/8. - Hemoglobin up to 8 today. - Will trend and if remains good at that point she should be ready medically for discharge  Unwitnessed fall causing displaced fracture of the right distal femoral metaphysis. - Reason for admission  - status post ORIF by orthopedic on 8/4.  Postop management, wound care, weightbearing precautions, pain management and follow-up by surgical team.   GERD PPI   HLD Lipitor   H/o dementia Depression Aricept  and Namenda  Difficult to understand/make herself understood at baseline secondary to her dementia. Seroquel , Effexor    Renal artery stenosis - Follows outpatient vascular. Plavix  & Statin by outptn Vascular.    Essential hypertension - On verapamil .  IV as needed -Good thus far     DVT prophylaxis:  SCDs Start: 04/01/24 1758 SCDs Start: 04/01/24 1617  Code Status:   Code Status: Full Code Level of care: Med-Surg Status is: Inpatient   Consults called: Orthopedic surgery  Subjective: Patient without complaints this morning  Objective: Vitals:   04/04/24 1522 04/04/24 2009  04/05/24 0430 04/05/24 0856  BP: 133/63 128/63 (!) 137/42 (!) 133/46  Pulse: 67 (!) 56 86 87  Resp: 16 16 16 15   Temp: 97.8 F (36.6 C) (!) 97 F (36.1 C) 97.7 F (36.5 C) 98.6 F (37 C)  TempSrc: Axillary   Oral  SpO2: 100% 90% 100% 100%  Weight:      Height:        Intake/Output Summary (Last 24 hours) at 04/05/2024 1613 Last data filed at 04/05/2024 1255 Gross per 24 hour  Intake 1084 ml  Output 450 ml  Net 634 ml   Filed Weights   04/01/24 1053  Weight: 54.4 kg   Body mass index is 20.59 kg/m.  Gen: 73 y.o. female in no apparent distress.  Nontoxic, comfortable appearing.  Difficult for her to communicate due to her cognitive impairment Pulm: Non-labored breathing.  Clear to auscultation bilaterally.  CV: Regular rate and rhythm. No murmur, rub, or gallop. No JVD GI: Abdomen soft, non-tender, non-distended Ext: Warm, no deformities, bandage in place over hip. Skin: No rashes, lesions  Neuro: Alert but does not appear to be oriented.  Difficult to assess secondary to cognitive impairment.  Grossly moving all extremities.   I have personally reviewed the following labs and images: CBC: Recent Labs  Lab 04/01/24 0736 04/02/24 0646 04/03/24 0707 04/04/24 0607 04/05/24 0706  WBC 16.8* 13.0* 12.9* 10.0 10.5  NEUTROABS 13.4*  --   --   --   --   HGB 10.3* 8.4* 7.4* 6.2* 8.0*  HCT 29.8* 25.0* 21.4* 18.6* 23.6*  MCV 88.2 90.9 89.2 91.2 88.7  PLT  247 231 216 176 186   BMP &GFR Recent Labs  Lab 04/01/24 0736 04/02/24 0646 04/03/24 0707 04/04/24 0607 04/05/24 0706  NA 140 136 135 139 142  K 4.1 4.5 3.8 3.8 4.2  CL 103 105 103 107 109  CO2 23 20* 21* 25 25  GLUCOSE 105* 161* 108* 104* 105*  BUN 21 31* 43* 39* 44*  CREATININE 0.65 1.00 1.05* 0.76 0.72  CALCIUM  9.5 9.0 8.9 8.7* 9.0  MG  --   --  1.9 2.0 1.9  PHOS  --   --  2.5  --   --    Estimated Creatinine Clearance: 52.2 mL/min (by C-G formula based on SCr of 0.72 mg/dL). Liver & Pancreas: Recent Labs   Lab 04/01/24 0736  AST 23  ALT 16  ALKPHOS 90  BILITOT 1.0  PROT 6.5  ALBUMIN 3.5   No results for input(s): LIPASE, AMYLASE in the last 168 hours. No results for input(s): AMMONIA in the last 168 hours. Diabetic: No results for input(s): HGBA1C in the last 72 hours. No results for input(s): GLUCAP in the last 168 hours. Cardiac Enzymes: No results for input(s): CKTOTAL, CKMB, CKMBINDEX, TROPONINI in the last 168 hours. No results for input(s): PROBNP in the last 8760 hours. Coagulation Profile: No results for input(s): INR, PROTIME in the last 168 hours. Thyroid Function Tests: No results for input(s): TSH, T4TOTAL, FREET4, T3FREE, THYROIDAB in the last 72 hours. Lipid Profile: No results for input(s): CHOL, HDL, LDLCALC, TRIG, CHOLHDL, LDLDIRECT in the last 72 hours. Anemia Panel: No results for input(s): VITAMINB12, FOLATE, FERRITIN, TIBC, IRON, RETICCTPCT in the last 72 hours. Urine analysis:    Component Value Date/Time   COLORURINE YELLOW (A) 11/01/2022 2127   APPEARANCEUR CLOUDY (A) 11/01/2022 2127   LABSPEC 1.033 (H) 11/01/2022 2127   PHURINE 8.0 11/01/2022 2127   GLUCOSEU NEGATIVE 11/01/2022 2127   HGBUR NEGATIVE 11/01/2022 2127   BILIRUBINUR NEGATIVE 11/01/2022 2127   KETONESUR NEGATIVE 11/01/2022 2127   PROTEINUR NEGATIVE 11/01/2022 2127   NITRITE NEGATIVE 11/01/2022 2127   LEUKOCYTESUR NEGATIVE 11/01/2022 2127   Sepsis Labs: Invalid input(s): PROCALCITONIN, LACTICIDVEN  Microbiology: Recent Results (from the past 240 hours)  Surgical pcr screen     Status: None   Collection Time: 04/01/24 10:53 AM   Specimen: Nasal Mucosa; Nasal Swab  Result Value Ref Range Status   MRSA, PCR NEGATIVE NEGATIVE Final   Staphylococcus aureus NEGATIVE NEGATIVE Final    Comment: (NOTE) The Xpert SA Assay (FDA approved for NASAL specimens in patients 80 years of age and older), is one component of a  comprehensive surveillance program. It is not intended to diagnose infection nor to guide or monitor treatment. Performed at [facility], [address], [place], [place] [address]     Radiology Studies: No results found.  Scheduled Meds:  acetaminophen   1,000 mg Oral Q8H   atorvastatin   10 mg Oral Daily   clopidogrel   75 mg Oral Daily   docusate sodium   100 mg Oral BID   donepezil   5 mg Oral QHS   memantine   10 mg Oral BID   methocarbamol   500 mg Oral TID   pantoprazole   40 mg Oral Daily   polyethylene glycol  17 g Oral Daily   QUEtiapine   25 mg Oral QHS   senna-docusate  1 tablet Oral QHS   venlafaxine  XR  37.5 mg Oral Daily   verapamil   180 mg Oral BID   Continuous Infusions:  LOS: 4 days   35 minutes with more than 50% spent in reviewing records, counseling patient/family and coordinating care.  Reyes VEAR Gaw, MD [facility] [email or web address] 04/05/2024, 4:13 PM

## 2024-04-05 NOTE — TOC Progression Note (Addendum)
 Transition of Care [facility]) - Progression Note    Patient Details  Name: Melody Stone MRN: [id number] Date of Birth: 1950-10-04  Transition [facility]) CM/SW Contact  Asmara Backs LITTIE Moose, LCSW Phone Number: 04/05/2024, 10:52 AM  Clinical Narrative:    CSW contacted pt brother, [name] who is also her legal guardian to discuss bed offers. [name] provided CSW with his email and CSW sent him the list of bed offers. CSW will follow up about SNF choice.  12:18 PM- CSW received a call from [place], he chose [facility] when pt is medically ready to DC to facility. CSW still waiting on PASRR to be approved. CSW confirmed bed availability with [facility] for Monday 8/11.   Expected Discharge Plan: Skilled Nursing Facility Barriers to Discharge: Continued Medical Work up, SNF Pending bed offer               Expected Discharge Plan and Services     Post Acute Care Choice: Skilled Nursing Facility Living arrangements for the past 2 months: Group Home                                       Social Drivers of Health (SDOH) Interventions SDOH Screenings   Food Insecurity: Patient Unable To Answer (04/02/2024)  Housing: Patient Unable To Answer (04/02/2024)  Transportation Needs: Patient Unable To Answer (04/02/2024)  Utilities: Patient Unable To Answer (04/02/2024)  Social Connections: Patient Unable To Answer (04/02/2024)  Tobacco Use: Low Risk  (04/01/2024)    Readmission Risk Interventions     No data to display

## 2024-04-05 NOTE — NC FL2 (Signed)
 [place]  MEDICAID FL2 LEVEL OF CARE FORM     IDENTIFICATION  Patient Name: Melody Stone Birthdate: 1951/04/03 Sex: female Admission Date (Current Location): 04/01/2024  [facility] and [name] Number:  Chiropodist and Address:  [facility], [address] [place], [place] [address]      Provider Number: [id number]  Attending Physician Name and Address:  Elpidio Reyes DEL, MD  Relative Name and Phone Number:       Current Level of Care: Hospital Recommended Level of Care: Skilled Nursing Facility Prior Approval Number:    Date Approved/Denied:   PASRR Number: [id number]  Discharge Plan: SNF    Current Diagnoses: Patient Active Problem List   Diagnosis Date Noted   Closed displaced supracondylar fracture of distal end of right femur without intracondylar extension (HCC) 04/01/2024   Unintentional weight loss 02/07/2023   Renal artery stenosis (HCC) 11/29/2022   Hypertension, 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   Pelvic fracture (HCC) 12/29/2021   Fall    Pubic ramus fracture, right, closed, initial encounter (HCC) 12/28/2021   Mild Alzheimer's dementia (HCC) 07/21/2021   Medicare annual wellness visit, initial 02/27/2019   Benign essential hypertension 08/14/2018   Dysphagia, oropharyngeal 07/08/2016   Lung nodule, solitary 03/30/2016   Moderate aortic insufficiency 03/30/2016   HCV (hepatitis C virus) 02/19/2016   HLD (hyperlipidemia) 02/19/2016   BP (high blood pressure) 02/19/2016   Intellectual disability 02/19/2016   Esophagitis, reflux 02/19/2016   Breathlessness on exertion 10/27/2015    Orientation RESPIRATION BLADDER Height & Weight      (Cognitively impaired)  Normal Incontinent Weight: 119 lb 14.9 oz (54.4 kg) Height:  5' 4 (162.6 cm)  BEHAVIORAL SYMPTOMS/MOOD NEUROLOGICAL BOWEL NUTRITION STATUS      Continent Diet (See DC Summary)  AMBULATORY STATUS COMMUNICATION OF NEEDS Skin   Extensive Assist Does not communicate Other  (Comment) (Closed surgical incision on right hip; wound on face)                       Personal Care Assistance Level of Assistance  Bathing, Feeding, Dressing Bathing Assistance: Maximum assistance Feeding assistance: Limited assistance Dressing Assistance: Maximum assistance     Functional Limitations Info  Sight, Speech, Hearing Sight Info: Adequate Hearing Info: Adequate Speech Info: Impaired    SPECIAL CARE FACTORS FREQUENCY  PT (By licensed PT), OT (By licensed OT)     PT Frequency: 5x/week OT Frequency: 5x/week            Contractures Contractures Info: Not present    Additional Factors Info  Allergies, Code Status Code Status Info: Full Allergies Info: Ciproflaxcin; Psychotropic drugs cause kidney failure           Current Medications (04/05/2024):  This is the current hospital active medication list Current Facility-Administered Medications  Medication Dose Route Frequency Provider Last Rate Last Admin   acetaminophen  (TYLENOL ) tablet 1,000 mg  1,000 mg Oral Q8H Deward Eck, PA-C   1,000 mg at 04/05/24 1459   acetaminophen  (TYLENOL ) tablet 325-650 mg  325-650 mg Oral Q6H PRN Deward Eck, PA-C       atorvastatin  (LIPITOR) tablet 10 mg  10 mg Oral Daily Deward Eck, PA-C   10 mg at 04/05/24 1016   clopidogrel  (PLAVIX ) tablet 75 mg  75 mg Oral Daily Deward Eck, PA-C   75 mg at 04/05/24 1016   docusate sodium  (COLACE) capsule 100 mg  100 mg Oral BID Deward Eck, PA-C   100  mg at 04/05/24 1016   donepezil  (ARICEPT ) tablet 5 mg  5 mg Oral QHS Deward Eck, PA-C   5 mg at 04/04/24 2239   hydrALAZINE  (APRESOLINE ) injection 10 mg  10 mg Intravenous Q4H PRN Amin, Ankit C, MD       HYDROmorphone  (DILAUDID ) injection 0.5-1 mg  0.5-1 mg Intravenous Q4H PRN Deward Eck, PA-C       ipratropium-albuterol  (DUONEB) 0.5-2.5 (3) MG/3ML nebulizer solution 3 mL  3 mL Nebulization Q4H PRN Amin, Ankit C, MD       memantine  (NAMENDA ) tablet 10 mg  10 mg Oral BID Deward Eck, PA-C    10 mg at 04/05/24 1156   menthol -cetylpyridinium (CEPACOL) lozenge 3 mg  1 lozenge Oral PRN Deward Eck, PA-C       Or   phenol (CHLORASEPTIC) mouth spray 1 spray  1 spray Mouth/Throat PRN Deward Eck, PA-C       methocarbamol  (ROBAXIN ) tablet 500 mg  500 mg Oral TID Deward Eck, PA-C   500 mg at 04/05/24 1459   metoCLOPramide  (REGLAN ) tablet 5-10 mg  5-10 mg Oral Q8H PRN Deward Eck, PA-C       Or   metoCLOPramide  (REGLAN ) injection 5-10 mg  5-10 mg Intravenous Q8H PRN Deward Eck, PA-C       metoprolol  tartrate (LOPRESSOR ) injection 5 mg  5 mg Intravenous Q4H PRN Amin, Ankit C, MD       ondansetron  (ZOFRAN ) tablet 4 mg  4 mg Oral Q6H PRN Deward Eck, PA-C       Or   ondansetron  (ZOFRAN ) injection 4 mg  4 mg Intravenous Q6H PRN Deward Eck, PA-C       oxyCODONE  (Oxy IR/ROXICODONE ) immediate release tablet 2.5 mg  2.5 mg Oral Q4H PRN Deward Eck, PA-C   2.5 mg at 04/02/24 0152   oxyCODONE  (Oxy IR/ROXICODONE ) immediate release tablet 5 mg  5 mg Oral Q4H PRN Deward Eck, PA-C   5 mg at 04/04/24 0853   pantoprazole  (PROTONIX ) EC tablet 40 mg  40 mg Oral Daily Deward Eck, PA-C   40 mg at 04/05/24 1016   polyethylene glycol (MIRALAX  / GLYCOLAX ) packet 17 g  17 g Oral Daily Deward Eck, PA-C   17 g at 04/05/24 1016   QUEtiapine  (SEROQUEL ) tablet 25 mg  25 mg Oral QHS Deward Eck, PA-C   25 mg at 04/04/24 2239   senna-docusate (Senokot-S) tablet 1 tablet  1 tablet Oral QHS Deward Eck, PA-C   1 tablet at 04/04/24 2240   senna-docusate (Senokot-S) tablet 1 tablet  1 tablet Oral QHS PRN Amin, Ankit C, MD       traZODone  (DESYREL ) tablet 50 mg  50 mg Oral QHS PRN Amin, Ankit C, MD       venlafaxine  XR (EFFEXOR -XR) 24 hr capsule 37.5 mg  37.5 mg Oral Daily Deward Eck, PA-C   37.5 mg at 04/05/24 1016   verapamil  (CALAN -SR) CR tablet 180 mg  180 mg Oral BID Deward Eck, PA-C   180 mg at 04/05/24 1016     Discharge Medications: Please see discharge summary for a list of discharge  medications.  Relevant Imaging Results:  Relevant Lab Results:   Additional Information SSN: [id number]  Jeoffrey LITTIE Moose, LCSW

## 2024-04-05 NOTE — Plan of Care (Signed)
  Problem: Pain Managment: Goal: General experience of comfort will improve and/or be controlled Outcome: Progressing   Problem: Safety: Goal: Ability to remain free from injury will improve Outcome: Progressing   Problem: Skin Integrity: Goal: Risk for impaired skin integrity will decrease Outcome: Progressing

## 2024-04-05 NOTE — Plan of Care (Signed)
   Problem: Coping: Goal: Level of anxiety will decrease Outcome: Progressing

## 2024-04-06 DIAGNOSIS — S72451A Displaced supracondylar fracture without intracondylar extension of lower end of right femur, initial encounter for closed fracture: Secondary | ICD-10-CM | POA: Diagnosis not present

## 2024-04-06 MED ORDER — ENSURE PLUS HIGH PROTEIN PO LIQD
237.0000 mL | Freq: Two times a day (BID) | ORAL | Status: DC
  Administered 2024-04-06 – 2024-04-08 (×6): 237 mL via ORAL

## 2024-04-06 MED ORDER — MUPIROCIN 2 % EX OINT
TOPICAL_OINTMENT | Freq: Two times a day (BID) | CUTANEOUS | Status: DC
  Administered 2024-04-06: 1 via TOPICAL
  Filled 2024-04-06: qty 22

## 2024-04-06 MED ORDER — GERHARDT'S BUTT CREAM
TOPICAL_CREAM | Freq: Two times a day (BID) | CUTANEOUS | Status: DC
  Administered 2024-04-06: 1 via TOPICAL
  Filled 2024-04-06: qty 60

## 2024-04-06 NOTE — Plan of Care (Signed)
 Dependent feeder. Pt tolerating dysphagia diet, eating 100% meals and ensure plus protein shakes.  WOCN consulted for wounds (see flowsheet). Photos taken.  Sx site c/d/I, covered with foam dressing. Patient turned q2hr and pressure points offloaded.   Problem: Education: Goal: Knowledge of General Education information will improve Description: Including pain rating scale, medication(s)/side effects and non-pharmacologic comfort measures Outcome: Progressing   Problem: Health Behavior/Discharge Planning: Goal: Ability to manage health-related needs will improve Outcome: Progressing   Problem: Clinical Measurements: Goal: Ability to maintain clinical measurements within normal limits will improve Outcome: Progressing Goal: Will remain free from infection Outcome: Progressing Goal: Diagnostic test results will improve Outcome: Progressing Goal: Respiratory complications will improve Outcome: Progressing Goal: Cardiovascular complication will be avoided Outcome: Progressing

## 2024-04-06 NOTE — Progress Notes (Addendum)
 PROGRESS NOTE  Melody Stone Record  FMW:[id number] DOB: 03/13/1951 DOA: 04/01/2024 PCP: Cleotilde Oneil FALCON, MD  Consultants  Brief Narrative: 73 year old with history of dementia, cognitive delay, hepatitis C, HTN, HLD, GERD, dysphagia, aortic insufficiency, chronic anemia, frequent falls presented to the hospital for unwitnessed fall at the group home.  [facility] x-ray showed distal femur fracture, orthopedic was consulted.  Patient underwent ORIF on 8/4.  Postop PT/OT recommended SNF.  Developed postop acute blood loss anemia requiring 1 unit PRBC transfusion. 8/8: Hemoglobin improved status post 1 unit packed red blood cell.     Assessment & Plan:  Principal Problem:   S/P right hip fracture    Acute blood loss anemia - Likely postop.  No obvious signs of bleeding/melena.  Hemoglobin 6.2, 1 unit PRBC ordered.  Hemoglobin up to 8, labs pending for today.   - Will trend and if remains good she should be ready medically for discharge  Unwitnessed fall causing displaced fracture of the right distal femoral metaphysis. - Reason for admission  - status post ORIF by orthopedic on 8/4.  Postop management, wound care, weightbearing precautions, pain management and follow-up by surgical team.   GERD PPI   HLD Lipitor   H/o dementia Depression Aricept  and Namenda  Difficult to understand/make herself understood at baseline secondary to her dementia. Seroquel , Effexor    Renal artery stenosis - Follows outpatient vascular. Plavix  & Statin by outptn Vascular.    Essential hypertension - On verapamil .  IV as needed -Good thus far     DVT prophylaxis:  SCDs Start: 04/01/24 1758 SCDs Start: 04/01/24 1617  Code Status:   Code Status: Full Code Level of care: Med-Surg Status is: Inpatient   Consults called: Orthopedic surgery  Subjective: Patient without complaints this morning, sleeping but easily arousable.    Objective: Vitals:   04/05/24 0856 04/05/24 2001 04/06/24 0508 04/06/24 0920   BP: (!) 133/46 118/74 119/61 (!) 124/48  Pulse: 87 77 80 88  Resp: 15 16 20 14   Temp: 98.6 F (37 C) 98 F (36.7 C) 98.5 F (36.9 C) 98.5 F (36.9 C)  TempSrc: Oral   Oral  SpO2: 100% 100% 100% 99%  Weight:      Height:        Intake/Output Summary (Last 24 hours) at 04/06/2024 1355 Last data filed at 04/06/2024 1125 Gross per 24 hour  Intake 717 ml  Output 950 ml  Net -233 ml   Filed Weights   04/01/24 1053  Weight: 54.4 kg   Body mass index is 20.59 kg/m.  Gen: 73 y.o. female in no apparent distress.  Nontoxic, comfortable appearing.  Difficult for her to communicate due to her cognitive impairment Pulm: Non-labored breathing.  Clear to auscultation bilaterally.  CV: Regular rate and rhythm. No murmur, rub, or gallop. No JVD GI: Abdomen soft, non-tender, non-distended Ext: Warm, no deformities, bandage in place over hip. Skin: No rashes, lesions  Neuro: Alert but does not appear to be oriented.  Difficult to assess secondary to cognitive impairment.  Grossly moving all extremities.   I have personally reviewed the following labs and images: CBC: Recent Labs  Lab 04/01/24 0736 04/02/24 0646 04/03/24 0707 04/04/24 0607 04/05/24 0706  WBC 16.8* 13.0* 12.9* 10.0 10.5  NEUTROABS 13.4*  --   --   --   --   HGB 10.3* 8.4* 7.4* 6.2* 8.0*  HCT 29.8* 25.0* 21.4* 18.6* 23.6*  MCV 88.2 90.9 89.2 91.2 88.7  PLT 247 231 216 176 186  BMP &GFR Recent Labs  Lab 04/01/24 0736 04/02/24 0646 04/03/24 0707 04/04/24 0607 04/05/24 0706  NA 140 136 135 139 142  K 4.1 4.5 3.8 3.8 4.2  CL 103 105 103 107 109  CO2 23 20* 21* 25 25  GLUCOSE 105* 161* 108* 104* 105*  BUN 21 31* 43* 39* 44*  CREATININE 0.65 1.00 1.05* 0.76 0.72  CALCIUM  9.5 9.0 8.9 8.7* 9.0  MG  --   --  1.9 2.0 1.9  PHOS  --   --  2.5  --   --    Estimated Creatinine Clearance: 52.2 mL/min (by C-G formula based on SCr of 0.72 mg/dL). Liver & Pancreas: Recent Labs  Lab 04/01/24 0736  AST 23  ALT 16   ALKPHOS 90  BILITOT 1.0  PROT 6.5  ALBUMIN 3.5   No results for input(s): LIPASE, AMYLASE in the last 168 hours. No results for input(s): AMMONIA in the last 168 hours. Diabetic: No results for input(s): HGBA1C in the last 72 hours. No results for input(s): GLUCAP in the last 168 hours. Cardiac Enzymes: No results for input(s): CKTOTAL, CKMB, CKMBINDEX, TROPONINI in the last 168 hours. No results for input(s): PROBNP in the last 8760 hours. Coagulation Profile: No results for input(s): INR, PROTIME in the last 168 hours. Thyroid Function Tests: No results for input(s): TSH, T4TOTAL, FREET4, T3FREE, THYROIDAB in the last 72 hours. Lipid Profile: No results for input(s): CHOL, HDL, LDLCALC, TRIG, CHOLHDL, LDLDIRECT in the last 72 hours. Anemia Panel: No results for input(s): VITAMINB12, FOLATE, FERRITIN, TIBC, IRON, RETICCTPCT in the last 72 hours. Urine analysis:    Component Value Date/Time   COLORURINE YELLOW (A) 11/01/2022 2127   APPEARANCEUR CLOUDY (A) 11/01/2022 2127   LABSPEC 1.033 (H) 11/01/2022 2127   PHURINE 8.0 11/01/2022 2127   GLUCOSEU NEGATIVE 11/01/2022 2127   HGBUR NEGATIVE 11/01/2022 2127   BILIRUBINUR NEGATIVE 11/01/2022 2127   KETONESUR NEGATIVE 11/01/2022 2127   PROTEINUR NEGATIVE 11/01/2022 2127   NITRITE NEGATIVE 11/01/2022 2127   LEUKOCYTESUR NEGATIVE 11/01/2022 2127   Sepsis Labs: Invalid input(s): PROCALCITONIN, LACTICIDVEN  Microbiology: Recent Results (from the past 240 hours)  Surgical pcr screen     Status: None   Collection Time: 04/01/24 10:53 AM   Specimen: Nasal Mucosa; Nasal Swab  Result Value Ref Range Status   MRSA, PCR NEGATIVE NEGATIVE Final   Staphylococcus aureus NEGATIVE NEGATIVE Final    Comment: (NOTE) The Xpert SA Assay (FDA approved for NASAL specimens in patients 64 years of age and older), is one component of a comprehensive surveillance program. It is not  intended to diagnose infection nor to guide or monitor treatment. Performed at [facility], [address] [place], [place] [address]     Radiology Studies: No results found.  Scheduled Meds:  acetaminophen   1,000 mg Oral Q8H   atorvastatin   10 mg Oral Daily   clopidogrel   75 mg Oral Daily   docusate sodium   100 mg Oral BID   donepezil   5 mg Oral QHS   feeding supplement  237 mL Oral BID BM   memantine   10 mg Oral BID   methocarbamol   500 mg Oral TID   pantoprazole   40 mg Oral Daily   polyethylene glycol  17 g Oral Daily   QUEtiapine   25 mg Oral QHS   senna-docusate  1 tablet Oral QHS   venlafaxine  XR  37.5 mg Oral Daily   verapamil   180 mg Oral BID  Continuous Infusions:   LOS: 5 days   35 minutes with more than 50% spent in reviewing records, counseling patient/family and coordinating care.  Reyes VEAR Gaw, MD [facility] [email or web address] 04/06/2024, 1:55 PM

## 2024-04-06 NOTE — Consult Note (Addendum)
 WOC Nurse Consult Note: Reason for Consult: wounds  Wound type: 1.  R posterior heel Stage 2 Pressure Injury pink moist  2.  L knee Stage 2 Pressure Injury serum filled blister 3.  L forehead full thickness unknown etioloy  4.  Sacral Deep Tissue Pressure Injury purple maroon discoloration with some scattered areas of red moist tissue  Pressure Injury POA: no  Measurement: see nursing flowsheet  Wound bed: as above  Drainage (amount, consistency, odor) see nursing flowsheet  Periwound:erythema to buttocks  Dressing procedure/placement/frequency:  Cleanse L knee and R posterior heel wounds with NS, apply Xeroform gauze ([place] [phone]) every other day and cover with silicone foam.   Cleanse sacral wound with NS, apply Xeroform gauze [place] [phone]) to wound daily and secure with silicone foam or ABD pad whichever is preferred.   Cleanse R forehead wound with NS, apply Mupirocin  ointment to wound bed 2 times daily and leave open to air or cover with silicone foam whichever is preferred.   Patient should be placed on a low air loss mattress for pressure redistribution.  WOC team will follow sacral wound every 7 to 10 days to assess and change POC as needed.   POC discussed with bedside nurse.   Thank you,    Powell Bar MSN, RN-BC, [employer]

## 2024-04-07 DIAGNOSIS — S72451A Displaced supracondylar fracture without intracondylar extension of lower end of right femur, initial encounter for closed fracture: Secondary | ICD-10-CM | POA: Diagnosis not present

## 2024-04-07 LAB — BASIC METABOLIC PANEL WITH GFR
Anion gap: 10 (ref 5–15)
BUN: 40 mg/dL — ABNORMAL HIGH (ref 8–23)
CO2: 23 mmol/L (ref 22–32)
Calcium: 8.8 mg/dL — ABNORMAL LOW (ref 8.9–10.3)
Chloride: 111 mmol/L (ref 98–111)
Creatinine, Ser: 0.71 mg/dL (ref 0.44–1.00)
GFR, Estimated: >60 mL/min (ref >60)
Glucose, Bld: 119 mg/dL — ABNORMAL HIGH (ref 70–99)
Potassium: 3.9 mmol/L (ref 3.5–5.1)
Sodium: 144 mmol/L (ref 135–145)

## 2024-04-07 LAB — CBC
HCT: 24.3 % — ABNORMAL LOW (ref 36.0–46.0)
Hemoglobin: 7.9 g/dL — ABNORMAL LOW (ref 12.0–15.0)
MCH: 30 pg (ref 26.0–34.0)
MCHC: 32.5 g/dL (ref 30.0–36.0)
MCV: 92.4 fL (ref 80.0–100.0)
Platelets: 199 K/uL (ref 150–400)
RBC: 2.63 MIL/uL — ABNORMAL LOW (ref 3.87–5.11)
RDW: 16.1 % — ABNORMAL HIGH (ref 11.5–15.5)
WBC: 9.8 K/uL (ref 4.0–10.5)
nRBC: 0 % (ref 0.0–0.2)

## 2024-04-07 LAB — MAGNESIUM: Magnesium: 2 mg/dL (ref 1.7–2.4)

## 2024-04-07 NOTE — Plan of Care (Signed)
  Problem: Pain Managment: Goal: General experience of comfort will improve and/or be controlled Outcome: Progressing   Problem: Safety: Goal: Ability to remain free from injury will improve Outcome: Progressing

## 2024-04-07 NOTE — Progress Notes (Addendum)
 PROGRESS NOTE  Melody Stone  FMW:[id number] DOB: [birth year] DOA: 04/01/2024 PCP: Cleotilde Oneil FALCON, MD  Consultants  Brief Narrative: 73 year old with history of dementia, cognitive delay, hepatitis C, HTN, HLD, GERD, dysphagia, aortic insufficiency, chronic anemia, frequent falls presented to the hospital for unwitnessed fall at the group home.  [facility] x-ray showed distal femur fracture, orthopedic was consulted.  Patient underwent ORIF on 8/4.  Postop PT/OT recommended SNF.  Developed postop acute blood loss anemia requiring 1 unit PRBC transfusion. 8/8: Hemoglobin improved status post 1 unit packed red blood cell, trending since then.  Now awaiting SNF bed.       Assessment & Plan:  Principal Problem:   S/P right hip fracture    Acute blood loss anemia - Likely postop.  No obvious signs of bleeding/melena.  Hemoglobin 6.2, 1 unit PRBC ordered.  Hemoglobin up to 8, labs pending for today.   - Will trend and if remains good she should be ready medically for discharge-->has been stable over the weekend.    Unwitnessed fall causing displaced fracture of the right distal femoral metaphysis. - Reason for admission  - status post ORIF by orthopedic on 8/4.  Postop management, wound care, weightbearing precautions, pain management and follow-up by surgical team.   GERD PPI   HLD Lipitor   H/o dementia Depression Aricept  and Namenda  Difficult to understand/make herself understood at baseline secondary to her dementia. Seroquel , Effexor    Renal artery stenosis - Follows outpatient vascular. Plavix  & Statin by outptn Vascular.    Essential hypertension - On verapamil .  IV as needed -Good thus far     DVT prophylaxis:  SCDs Start: 04/01/24 1758 SCDs Start: 04/01/24 1617-->holding PPX AC in light of dropping Hgb and b/c she is a fall risk w/ hx/o dementia  Code Status:   Code Status: Full Code Level of care: Med-Surg Status is: Inpatient   Consults called: Orthopedic  surgery  Subjective: Patient without complaints this morning, sleeping but easily arousable.    Objective: Vitals:   04/06/24 1956 04/07/24 0512 04/07/24 0837 04/07/24 0839  BP: (!) 126/48 (!) 122/46  (!) 121/53  Pulse: 91 85  78  Resp:    14  Temp: 98.9 F (37.2 C) 98.9 F (37.2 C) 98.6 F (37 C) 98.6 F (37 C)  TempSrc:  Oral  Oral  SpO2: 100% 99%  90%  Weight:      Height:        Intake/Output Summary (Last 24 hours) at 04/07/2024 1453 Last data filed at 04/07/2024 1200 Gross per 24 hour  Intake 1014 ml  Output 150 ml  Net 864 ml   Filed Weights   04/01/24 1053  Weight: 54.4 kg   Body mass index is 20.59 kg/m.  Gen: 73 y.o. female in no apparent distress.  Nontoxic, comfortable appearing.  Difficult for her to communicate due to her cognitive impairment Pulm: Non-labored breathing.  Clear to auscultation bilaterally.  CV: Regular rate and rhythm. No murmur, rub, or gallop. No JVD GI: Abdomen soft, non-tender, non-distended Ext: Warm, no deformities, bandage in place over hip. Skin: No rashes, lesions  Neuro: Alert but does not appear to be oriented.  Difficult to assess secondary to cognitive impairment.  Grossly moving all extremities.   I have personally reviewed the following labs and images: CBC: Recent Labs  Lab 04/01/24 0736 04/02/24 0646 04/03/24 0707 04/04/24 0607 04/05/24 0706 04/07/24 0830  WBC 16.8* 13.0* 12.9* 10.0 10.5 9.8  NEUTROABS 13.4*  --   --   --   --   --  HGB 10.3* 8.4* 7.4* 6.2* 8.0* 7.9*  HCT 29.8* 25.0* 21.4* 18.6* 23.6* 24.3*  MCV 88.2 90.9 89.2 91.2 88.7 92.4  PLT 247 231 216 176 186 199   BMP &GFR Recent Labs  Lab 04/02/24 0646 04/03/24 0707 04/04/24 0607 04/05/24 0706 04/07/24 0830  NA 136 135 139 142 144  K 4.5 3.8 3.8 4.2 3.9  CL 105 103 107 109 111  CO2 20* 21* 25 25 23   GLUCOSE 161* 108* 104* 105* 119*  BUN 31* 43* 39* 44* 40*  CREATININE 1.00 1.05* 0.76 0.72 0.71  CALCIUM  9.0 8.9 8.7* 9.0 8.8*  MG  --   1.9 2.0 1.9 2.0  PHOS  --  2.5  --   --   --    Estimated Creatinine Clearance: 52.2 mL/min (by C-G formula based on SCr of 0.71 mg/dL). Liver & Pancreas: Recent Labs  Lab 04/01/24 0736  AST 23  ALT 16  ALKPHOS 90  BILITOT 1.0  PROT 6.5  ALBUMIN 3.5   No results for input(s): LIPASE, AMYLASE in the last 168 hours. No results for input(s): AMMONIA in the last 168 hours. Diabetic: No results for input(s): HGBA1C in the last 72 hours. No results for input(s): GLUCAP in the last 168 hours. Cardiac Enzymes: No results for input(s): CKTOTAL, CKMB, CKMBINDEX, TROPONINI in the last 168 hours. No results for input(s): PROBNP in the last 8760 hours. Coagulation Profile: No results for input(s): INR, PROTIME in the last 168 hours. Thyroid Function Tests: No results for input(s): TSH, T4TOTAL, FREET4, T3FREE, THYROIDAB in the last 72 hours. Lipid Profile: No results for input(s): CHOL, HDL, LDLCALC, TRIG, CHOLHDL, LDLDIRECT in the last 72 hours. Anemia Panel: No results for input(s): VITAMINB12, FOLATE, FERRITIN, TIBC, IRON, RETICCTPCT in the last 72 hours. Urine analysis:    Component Value Date/Time   COLORURINE YELLOW (A) 11/01/2022 2127   APPEARANCEUR CLOUDY (A) 11/01/2022 2127   LABSPEC 1.033 (H) 11/01/2022 2127   PHURINE 8.0 11/01/2022 2127   GLUCOSEU NEGATIVE 11/01/2022 2127   HGBUR NEGATIVE 11/01/2022 2127   BILIRUBINUR NEGATIVE 11/01/2022 2127   KETONESUR NEGATIVE 11/01/2022 2127   PROTEINUR NEGATIVE 11/01/2022 2127   NITRITE NEGATIVE 11/01/2022 2127   LEUKOCYTESUR NEGATIVE 11/01/2022 2127   Sepsis Labs: Invalid input(s): PROCALCITONIN, LACTICIDVEN  Microbiology: Recent Results (from the past 240 hours)  Surgical pcr screen     Status: None   Collection Time: 04/01/24 10:53 AM   Specimen: Nasal Mucosa; Nasal Swab  Result Value Ref Range Status   MRSA, PCR NEGATIVE NEGATIVE Final   Staphylococcus aureus  NEGATIVE NEGATIVE Final    Comment: (NOTE) The Xpert SA Assay (FDA approved for NASAL specimens in patients 78 years of age and older), is one component of a comprehensive surveillance program. It is not intended to diagnose infection nor to guide or monitor treatment. Performed at [facility], [address], [place], [place] [address]     Radiology Studies: No results found.  Scheduled Meds:  acetaminophen   1,000 mg Oral Q8H   atorvastatin   10 mg Oral Daily   clopidogrel   75 mg Oral Daily   docusate sodium   100 mg Oral BID   donepezil   5 mg Oral QHS   feeding supplement  237 mL Oral BID BM   Gerhardt's butt cream   Topical BID   memantine   10 mg Oral BID   methocarbamol   500 mg Oral TID   mupirocin  ointment   Topical BID   pantoprazole   40 mg Oral Daily   polyethylene glycol  17 g Oral Daily   QUEtiapine   25 mg Oral QHS   senna-docusate  1 tablet Oral QHS   venlafaxine  XR  37.5 mg Oral Daily   verapamil   180 mg Oral BID   Continuous Infusions:   LOS: 6 days   35 minutes with more than 50% spent in reviewing records, counseling patient/family and coordinating care.  Reyes VEAR Gaw, MD [facility] [email or web address] 04/07/2024, 2:53 PM

## 2024-04-08 ENCOUNTER — Encounter (HOSPITAL_COMMUNITY): Payer: Self-pay | Admitting: Hospitalist

## 2024-04-08 DIAGNOSIS — S72451D Displaced supracondylar fracture without intracondylar extension of lower end of right femur, subsequent encounter for closed fracture with routine healing: Secondary | ICD-10-CM | POA: Diagnosis not present

## 2024-04-08 MED ORDER — CLONAZEPAM 0.5 MG PO TABS: 0.2500 mg | ORAL_TABLET | Freq: Every day | ORAL | 0 refills | Status: DC

## 2024-04-08 NOTE — Plan of Care (Signed)

## 2024-04-08 NOTE — TOC Transition Note (Signed)
 Transition of Care [facility]) - Discharge Note   Patient Details  Name: Melody Stone MRN: [id number] Date of Birth: [birth year]  Transition [facility]) CM/SW Contact:  Jeoffrey LITTIE Moose, LCSW Phone Number: 04/08/2024, 2:15 PM   Clinical Narrative:    Patient will DC to: [facility] Anticipated DC date: 04/08/24 Family notified: Yes Transport by: [facility]   Per MD patient ready for DC to [facility]. RN to call report prior to discharge [phone] Room 120. RN, patient, patient's family, and facility notified of DC. Discharge Summary and FL2 sent to facility. DC packet on chart along with scripts. Ambulance transport requested for patient.   CSW will sign off for now as social work intervention is no longer needed. Please consult us  again if new needs arise.     Final next level of care: Skilled Nursing Facility Barriers to Discharge: Barriers Resolved   Patient Goals and CMS Choice Patient states their goals for this hospitalization and ongoing recovery are:: SNF CMS Medicare.gov Compare Post Acute Care list provided to:: Legal Guardian Choice offered to / list presented to : Sibling      Discharge Placement   Existing PASRR number confirmed : 04/08/24          Patient chooses bed at: [facility] Patient to be transferred to facility by: [facility] Name of family member notified: [name] Patient and family notified of of transfer: 04/08/24  Discharge Plan and Services Additional resources added to the After Visit Summary for       Post Acute Care Choice: Skilled Nursing Facility                               Social Drivers of Health (SDOH) Interventions SDOH Screenings   Food Insecurity: Patient Unable To Answer (04/02/2024)  Housing: Patient Unable To Answer (04/02/2024)  Transportation Needs: Patient Unable To Answer (04/02/2024)  Utilities: Patient Unable To Answer (04/02/2024)  Social Connections: Patient Unable To Answer (04/02/2024)  Tobacco Use: Low Risk   (04/01/2024)     Readmission Risk Interventions     No data to display

## 2024-04-08 NOTE — Plan of Care (Signed)
  Problem: Education: Goal: Knowledge of General Education information will improve Description: Including pain rating scale, medication(s)/side effects and non-pharmacologic comfort measures Outcome: Adequate for Discharge   Problem: Health Behavior/Discharge Planning: Goal: Ability to manage health-related needs will improve Outcome: Adequate for Discharge   Problem: Clinical Measurements: Goal: Ability to maintain clinical measurements within normal limits will improve Outcome: Adequate for Discharge Goal: Will remain free from infection Outcome: Adequate for Discharge Goal: Diagnostic test results will improve Outcome: Adequate for Discharge Goal: Respiratory complications will improve Outcome: Adequate for Discharge Goal: Cardiovascular complication will be avoided Outcome: Adequate for Discharge   Problem: Activity: Goal: Risk for activity intolerance will decrease Outcome: Adequate for Discharge   Problem: Nutrition: Goal: Adequate nutrition will be maintained Outcome: Adequate for Discharge   Problem: Coping: Goal: Level of anxiety will decrease Outcome: Adequate for Discharge   Problem: Elimination: Goal: Will not experience complications related to bowel motility Outcome: Adequate for Discharge Goal: Will not experience complications related to urinary retention Outcome: Adequate for Discharge   Problem: Pain Managment: Goal: General experience of comfort will improve and/or be controlled Outcome: Adequate for Discharge   Problem: Safety: Goal: Ability to remain free from injury will improve Outcome: Adequate for Discharge   Problem: Skin Integrity: Goal: Risk for impaired skin integrity will decrease Outcome: Adequate for Discharge   Problem: Education: Goal: Verbalization of understanding the information provided (i.e., activity precautions, restrictions, etc) will improve Outcome: Adequate for Discharge Goal: Individualized Educational  Video(s) Outcome: Adequate for Discharge   Problem: Activity: Goal: Ability to ambulate and perform ADLs will improve Outcome: Adequate for Discharge   Problem: Clinical Measurements: Goal: Postoperative complications will be avoided or minimized Outcome: Adequate for Discharge   Problem: Self-Concept: Goal: Ability to maintain and perform role responsibilities to the fullest extent possible will improve Outcome: Adequate for Discharge   Problem: Pain Management: Goal: Pain level will decrease Outcome: Adequate for Discharge   Problem: Acute Rehab PT Goals(only PT should resolve) Goal: Pt Will Go Supine/Side To Sit Outcome: Adequate for Discharge Goal: Pt Will Go Sit To Supine/Side Outcome: Adequate for Discharge Goal: Pt Will Transfer Bed To Chair/Chair To Bed Outcome: Adequate for Discharge

## 2024-04-08 NOTE — Discharge Summary (Addendum)
 Physician Discharge Summary  Melody Stone FMW:[id number] DOB: 1951/01/27 DOA: 04/01/2024  PCP: Cleotilde Oneil FALCON, MD  Admit date: 04/01/2024 Discharge date: 04/08/2024  Admitted From: home Disposition:  SNF  Recommendations for Outpatient Follow-up:  Follow up with PCP in 1-2 weeks Please obtain BMP/CBC in one week  Home Health: none Equipment/Devices: none  Discharge Condition: stable CODE STATUS: Full code Diet Orders (From admission, onward)     Start     Ordered   04/02/24 1357  DIET DYS 3 Room service appropriate? Yes; Fluid consistency: Thin  Diet effective now       Comments: Full supervision and assist with feeding  Question Answer Comment  Room service appropriate? Yes   Fluid consistency: Thin      04/02/24 1357            Brief Narrative: 73 year old with history of dementia, cognitive delay, hepatitis C, HTN, HLD, GERD, dysphagia, aortic insufficiency, chronic anemia, frequent falls presented to the hospital for unwitnessed fall at the group home.  [facility] x-ray showed distal femur fracture, orthopedic was consulted.  Patient underwent ORIF on 8/4.  Postop PT/OT recommended SNF.  Developed postop acute blood loss anemia requiring 1 unit PRBC transfusion.  Hospital Course / Discharge diagnoses: Principal problem Unwitnessed fall causing displaced fracture of the right distal femoral metaphysis - reason for admission, status post ORIF by orthopedic on 8/4.  She did well postoperatively, PT recommends SNF, will be discharged in stable condition  Active problems Acute blood loss anemia -postoperatively, received a unit of packed red blood cells.  Hemoglobin overall stable, she has no bleeding.  Continue periodically monitoring as an outpatient  HLD - Lipitor H/o dementia Depression - Aricept  and Namenda . Difficult to understand/make herself understood at baseline secondary to her dementia.  Renal artery stenosis - Follows outpatient vascular. Plavix  & Statin by  outpatient vascular  surgery  Essential hypertension -Normotensive on verapamil , continue on dc  Sepsis ruled out   Discharge Instructions   Allergies as of 04/08/2024       Reactions   Other Other (See Comments)   Psychotropic drugs cause kidney failure. Psychotropic drug cause renal failure. Psychotropic drugs cause kidney failure.   Ciprofloxacin Rash   Per caregiver.        Medication List     STOP taking these medications    losartan  50 MG tablet Commonly known as: COZAAR        TAKE these medications    acetaminophen  500 MG tablet Commonly known as: TYLENOL  Take 2 tablets (1,000 mg total) by mouth every 8 (eight) hours as needed for mild pain (pain score 1-3).   Aloe Vera Oil Apply 1 Application topically as directed. Cream   aspirin  EC 81 MG tablet Take 81 mg by mouth in the morning. Swallow whole.   atorvastatin  10 MG tablet Commonly known as: Lipitor Take 1 tablet (10 mg total) by mouth daily. What changed: when to take this   chlorhexidine  0.12 % solution Commonly known as: PERIDEX  Use as directed 15 mLs in the mouth or throat 2 (two) times daily.   clonazePAM  0.5 MG tablet Commonly known as: KLONOPIN  Take 0.5 tablets (0.25 mg total) by mouth at bedtime.   clopidogrel  75 MG tablet Commonly known as: Plavix  Take 1 tablet (75 mg total) by mouth daily. What changed: when to take this   docusate sodium  100 MG capsule Commonly known as: COLACE Take 200 mg by mouth in the morning.   donepezil  10 MG  tablet Commonly known as: ARICEPT  Take 10 mg by mouth in the morning.   loratadine 10 MG tablet Commonly known as: CLARITIN Take 10 mg by mouth daily as needed for allergies.   Lumify 0.025 % Soln Generic drug: Brimonidine Tartrate Place 1 drop into both eyes in the morning.   memantine  10 MG tablet Commonly known as: NAMENDA  Take 10 mg by mouth 2 (two) times daily.   oxyCODONE  5 MG immediate release tablet Commonly known as: Oxy  IR/ROXICODONE  Take 0.5 tablets (2.5 mg total) by mouth every 4 (four) hours as needed for moderate pain (pain score 4-6) or severe pain (pain score 7-10).   pantoprazole  40 MG tablet Commonly known as: Protonix  Take 1 tablet (40 mg total) by mouth daily for 14 days. What changed: when to take this   polyethylene glycol 17 g packet Commonly known as: MiraLax  Take 17 g by mouth daily. What changed:  when to take this reasons to take this   QUEtiapine  25 MG tablet Commonly known as: SEROQUEL  Take 25 mg by mouth at bedtime.   senna 8.6 MG tablet Commonly known as: SENOKOT Take 1 tablet by mouth in the morning.   triamcinolone cream 0.1 % Commonly known as: KENALOG Apply 1 Application topically as needed.   venlafaxine  XR 75 MG 24 hr capsule Commonly known as: EFFEXOR -XR Take 75 mg by mouth in the morning.   verapamil  180 MG CR tablet Commonly known as: CALAN -SR Take 180 mg by mouth 2 (two) times daily.   Vitamin D3 25 MCG (1000 UT) Caps Take 2,000 Units by mouth in the morning.        Follow-up Information     Celena Sharper, MD. Schedule an appointment as soon as possible for a visit in 2 week(s).   Specialty: Orthopedic Surgery Contact information: [address] [place] [address] [phone]                 Consultations: Orthopedic surgery  Procedures/Studies:  DG Knee Right Port Result Date: 04/01/2024 CLINICAL DATA:  Fracture, postop. EXAM: PORTABLE RIGHT KNEE - 1-2 VIEW COMPARISON:  Preoperative imaging FINDINGS: Lateral plate and screw fixation of distal femur fracture. Improved fracture alignment from preoperative imaging. Mild residual displacement persists. Recent postsurgical change includes air and edema in the soft tissues. IMPRESSION: ORIF of distal femur fracture with improved fracture alignment from preoperative imaging. Electronically Signed   By: Andrea Gasman M.D.   On: 04/01/2024 17:07   DG FEMUR, MIN 2 VIEWS RIGHT Result  Date: 04/01/2024 CLINICAL DATA:  Elective surgery. EXAM: RIGHT FEMUR 2 VIEWS COMPARISON:  Radiograph earlier today FINDINGS: Five fluoroscopic spot views of the right femur submitted from the operating room. Lateral plate and screw fixation of distal femur fracture. Improved fracture alignment from preoperative imaging. Fluoroscopy time 1 minutes 6 seconds. Dose 4.88 mGy. IMPRESSION: Intraoperative fluoroscopy during ORIF of distal femur fracture. Electronically Signed   By: Andrea Gasman M.D.   On: 04/01/2024 17:06   DG C-Arm 1-60 Min-No Report Result Date: 04/01/2024 Fluoroscopy was utilized by the requesting physician.  No radiographic interpretation.   DG C-Arm 1-60 Min-No Report Result Date: 04/01/2024 Fluoroscopy was utilized by the requesting physician.  No radiographic interpretation.   CT Cervical Spine Wo Contrast Result Date: 04/01/2024 CLINICAL DATA:  Neck trauma (Age >= 65y) EXAM: CT CERVICAL SPINE WITHOUT CONTRAST TECHNIQUE: Multidetector CT imaging of the cervical spine was performed without intravenous contrast. Multiplanar CT image reconstructions were also generated. RADIATION DOSE REDUCTION:  This exam was performed according to the departmental dose-optimization program which includes automated exposure control, adjustment of the mA and/or kV according to patient size and/or use of iterative reconstruction technique. COMPARISON:  March 22, 2024 FINDINGS: Alignment: Unchanged reversal of the normal cervical lordosis. No spondylolisthesis, uncovering of the facet joints, or significant widening of the spinous processes. No subluxation or abnormality identified at the craniovertebral junction. Skull base and vertebrae: Vertebral body heights are preserved. No acute fracture. No primary bone lesion or focal pathologic process.The lateral masses of C1 are well aligned with C2. The odontoid is intact. Soft tissues and spinal canal: No prevertebral edema or soft tissue thickening. No visible canal  hematoma. Disc levels: Redemonstrated multilevel intervertebral disc height loss with bridging osteophyte formation involving C4-C5 through C7-T1. Similar severe degenerative changes on the right at C1-C2. Multilevel moderate facet arthropathy. Upper chest: Centrilobular emphysema. No focal airspace consolidation or pleural effusion. Other: None IMPRESSION: 1. No acute fracture or traumatic malalignment of the cervical spine. 2. Similarly appearing moderate multilevel degenerative disc disease of the spine. Electronically Signed   By: Rogelia Myers M.D.   On: 04/01/2024 08:48   CT Head Wo Contrast Result Date: 04/01/2024 CLINICAL DATA:  Head trauma, minor (Age >= 65y) EXAM: CT HEAD WITHOUT CONTRAST TECHNIQUE: Contiguous axial images were obtained from the base of the skull through the vertex without intravenous contrast. RADIATION DOSE REDUCTION: This exam was performed according to the departmental dose-optimization program which includes automated exposure control, adjustment of the mA and/or kV according to patient size and/or use of iterative reconstruction technique. COMPARISON:  March 22, 2024 FINDINGS: Motion degraded study. Brain: Chronic proportional prominence of the ventricles and sulci, consistent with diffuse cerebral parenchymal volume loss. The ventricles otherwise maintained midline position without midline shift. Gray-white differentiation is preserved.Periventricular and subcortical white matter hypoattenuation, most consistent with changes of moderate chronic ischemic microvascular disease.No evidence of acute territorial infarction, extra-axial fluid collection, hemorrhage, or mass lesion. The basilar cisterns are patent without downward herniation. The cerebellar hemispheres and vermis are well formed without mass lesion or focal attenuation abnormality. Vascular: No hyperdense vessel. Skull: Normal. Negative for fracture or focal lesion. Sinuses/Orbits: The paranasal sinuses and mastoids are  clear.The globes appear intact. No retrobulbar hematoma. Other: None. IMPRESSION: 1. No acute intracranial abnormality, specifically, no acute hemorrhage, territorial infarction, or intracranial mass. 2. Similar global cerebral volume loss with sequelae of advanced chronic ischemic microvascular disease. Electronically Signed   By: Rogelia Myers M.D.   On: 04/01/2024 08:41   DG Pelvis 1-2 Views Result Date: 04/01/2024 CLINICAL DATA:  [id number] Fall [id number] EXAM: PELVIS - 1-2 VIEW; RIGHT FEMUR 2 VIEWS COMPARISON:  Dec 28, 2021 FINDINGS: Pelvis Osteopenia.Cortical irregularity of the superior and inferior pubic rami adjacent to the symphyseal pubis.No acute hip fracture or dislocation.There is no evidence of arthropathy or other focal bone abnormality.Soft tissues are unremarkable. Femur Mildly-comminuted, predominantly obliquely oriented, angulated, and displaced fracture of the distal femoral metaphysis. There is approximately 30 degrees of posterior angulation of the largest distal femoral fragment. This fragment is 1.6 cm displaced with 2.5 cm of bony overlap. There is no evidence of arthropathy or other focal bone abnormality. Soft tissues are unremarkable. IMPRESSION: 1. Cortical irregularity of the parasymphyseal right superior and inferior pubic rami, which are in a region of fracture on the prior CT. These could represent healed fractures or nondisplaced acute fractures. Correlation with point tenderness recommended. 2. Mildly comminuted, angulated, and displaced fracture of the distal right femoral  metaphysis. Electronically Signed   By: Rogelia Myers M.D.   On: 04/01/2024 08:24   DG FEMUR, MIN 2 VIEWS RIGHT Result Date: 04/01/2024 CLINICAL DATA:  [id number] Fall [id number] EXAM: PELVIS - 1-2 VIEW; RIGHT FEMUR 2 VIEWS COMPARISON:  Dec 28, 2021 FINDINGS: Pelvis Osteopenia.Cortical irregularity of the superior and inferior pubic rami adjacent to the symphyseal pubis.No acute hip fracture or dislocation.There is no  evidence of arthropathy or other focal bone abnormality.Soft tissues are unremarkable. Femur Mildly-comminuted, predominantly obliquely oriented, angulated, and displaced fracture of the distal femoral metaphysis. There is approximately 30 degrees of posterior angulation of the largest distal femoral fragment. This fragment is 1.6 cm displaced with 2.5 cm of bony overlap. There is no evidence of arthropathy or other focal bone abnormality. Soft tissues are unremarkable. IMPRESSION: 1. Cortical irregularity of the parasymphyseal right superior and inferior pubic rami, which are in a region of fracture on the prior CT. These could represent healed fractures or nondisplaced acute fractures. Correlation with point tenderness recommended. 2. Mildly comminuted, angulated, and displaced fracture of the distal right femoral metaphysis. Electronically Signed   By: Rogelia Myers M.D.   On: 04/01/2024 08:24   CT Cervical Spine Wo Contrast Result Date: 03/22/2024 CLINICAL DATA:  Neck trauma (Age >= 65y).  Fall. EXAM: CT CERVICAL SPINE WITHOUT CONTRAST TECHNIQUE: Multidetector CT imaging of the cervical spine was performed without intravenous contrast. Multiplanar CT image reconstructions were also generated. RADIATION DOSE REDUCTION: This exam was performed according to the departmental dose-optimization program which includes automated exposure control, adjustment of the mA and/or kV according to patient size and/or use of iterative reconstruction technique. COMPARISON:  01/25/2023 FINDINGS: Alignment: No subluxation Skull base and vertebrae: No acute fracture. No primary bone lesion or focal pathologic process. Soft tissues and spinal canal: No prevertebral fluid or swelling. No visible canal hematoma. Disc levels: Degenerative disc disease changes with disc space narrowing and anterior spurring C4-5 through C7-T1. Moderate bilateral degenerative facet disease. Upper chest: No acute findings Other: None IMPRESSION:  Moderate cervical spondylosis.  No acute bony abnormality. Electronically Signed   By: Franky Crease M.D.   On: 03/22/2024 18:10   CT Head Wo Contrast Result Date: 03/22/2024 CLINICAL DATA:  Head trauma, minor (Age >= 65y) Daily Aspirin . Fall. EXAM: CT HEAD WITHOUT CONTRAST TECHNIQUE: Contiguous axial images were obtained from the base of the skull through the vertex without intravenous contrast. RADIATION DOSE REDUCTION: This exam was performed according to the departmental dose-optimization program which includes automated exposure control, adjustment of the mA and/or kV according to patient size and/or use of iterative reconstruction technique. COMPARISON:  01/25/2023 FINDINGS: Brain: There is atrophy and chronic small vessel disease changes. No acute intracranial abnormality. Specifically, no hemorrhage, hydrocephalus, mass lesion, acute infarction, or significant intracranial injury. Vascular: No hyperdense vessel or unexpected calcification. Skull: No acute calvarial abnormality. Sinuses/Orbits: No acute findings Other: None IMPRESSION: Atrophy, chronic microvascular disease. No acute intracranial abnormality. Electronically Signed   By: Franky Crease M.D.   On: 03/22/2024 18:08     Subjective: - no chest pain, shortness of breath, no abdominal pain, nausea or vomiting.   Discharge Exam: BP (!) 118/47 (BP Location: Left Arm)   Pulse 85   Temp 98.3 F (36.8 C) (Oral)   Resp 16   Ht 5' 4 (1.626 m)   Wt 54.4 kg   SpO2 100%   BMI 20.59 kg/m   General: Pt is alert, awake, not in acute distress Cardiovascular: RRR, S1/S2 +, no  rubs, no gallops Respiratory: CTA bilaterally, no wheezing, no rhonchi Abdominal: Soft, NT, ND, bowel sounds + Extremities: no edema, no cyanosis    The results of significant diagnostics from this hospitalization (including imaging, microbiology, ancillary and laboratory) are listed below for reference.     Microbiology: Recent Results (from the past 240  hours)  Surgical pcr screen     Status: None   Collection Time: 04/01/24 10:53 AM   Specimen: Nasal Mucosa; Nasal Swab  Result Value Ref Range Status   MRSA, PCR NEGATIVE NEGATIVE Final   Staphylococcus aureus NEGATIVE NEGATIVE Final    Comment: (NOTE) The Xpert SA Assay (FDA approved for NASAL specimens in patients 38 years of age and older), is one component of a comprehensive surveillance program. It is not intended to diagnose infection nor to guide or monitor treatment. Performed at [facility], [address] [place], [place] [address]      Labs: Basic Metabolic Panel: Recent Labs  Lab 04/02/24 [phone] 04/03/24 0707 04/04/24 0607 04/05/24 0706 04/07/24 0830  NA 136 135 139 142 144  K 4.5 3.8 3.8 4.2 3.9  CL 105 103 107 109 111  CO2 20* 21* 25 25 23   GLUCOSE 161* 108* 104* 105* 119*  BUN 31* 43* 39* 44* 40*  CREATININE 1.00 1.05* 0.76 0.72 0.71  CALCIUM  9.0 8.9 8.7* 9.0 8.8*  MG  --  1.9 2.0 1.9 2.0  PHOS  --  2.5  --   --   --    Liver Function Tests: No results for input(s): AST, ALT, ALKPHOS, BILITOT, PROT, ALBUMIN in the last 168 hours. CBC: Recent Labs  Lab 04/02/24 0646 04/03/24 0707 04/04/24 0607 04/05/24 0706 04/07/24 0830  WBC 13.0* 12.9* 10.0 10.5 9.8  HGB 8.4* 7.4* 6.2* 8.0* 7.9*  HCT 25.0* 21.4* 18.6* 23.6* 24.3*  MCV 90.9 89.2 91.2 88.7 92.4  PLT 231 216 176 186 199   CBG: No results for input(s): GLUCAP in the last 168 hours. Hgb A1c No results for input(s): HGBA1C in the last 72 hours. Lipid Profile No results for input(s): CHOL, HDL, LDLCALC, TRIG, CHOLHDL, LDLDIRECT in the last 72 hours. Thyroid function studies No results for input(s): TSH, T4TOTAL, T3FREE, THYROIDAB in the last 72 hours.  Invalid input(s): FREET3 Urinalysis    Component Value Date/Time   COLORURINE YELLOW (A) 11/01/2022 2127   APPEARANCEUR CLOUDY (A) 11/01/2022 2127   LABSPEC 1.033 (H) 11/01/2022 2127   PHURINE 8.0  11/01/2022 2127   GLUCOSEU NEGATIVE 11/01/2022 2127   HGBUR NEGATIVE 11/01/2022 2127   BILIRUBINUR NEGATIVE 11/01/2022 2127   KETONESUR NEGATIVE 11/01/2022 2127   PROTEINUR NEGATIVE 11/01/2022 2127   NITRITE NEGATIVE 11/01/2022 2127   LEUKOCYTESUR NEGATIVE 11/01/2022 2127    FURTHER DISCHARGE INSTRUCTIONS:   Get Medicines reviewed and adjusted: Please take all your medications with you for your next visit with your Primary MD   Laboratory/radiological data: Please request your Primary MD to go over all hospital tests and procedure/radiological results at the follow up, please ask your Primary MD to get all Hospital records sent to his/her office.   In some cases, they will be blood work, cultures and biopsy results pending at the time of your discharge. Please request that your primary care M.D. goes through all the records of your hospital data and follows up on these results.   Also Note the following: If you experience worsening of your admission symptoms, develop shortness of breath, life threatening emergency, suicidal or homicidal  thoughts you must seek medical attention immediately by calling 911 or calling your MD immediately  if symptoms less severe.   You must read complete instructions/literature along with all the possible adverse reactions/side effects for all the Medicines you take and that have been prescribed to you. Take any new Medicines after you have completely understood and accpet all the possible adverse reactions/side effects.    Do not drive when taking Pain medications or sleeping medications (Benzodaizepines)   Do not take more than prescribed Pain, Sleep and Anxiety Medications. It is not advisable to combine anxiety,sleep and pain medications without talking with your primary care practitioner   Special Instructions: If you have smoked or chewed Tobacco  in the last 2 yrs please stop smoking, stop any regular Alcohol  and or any Recreational drug use.    Wear Seat belts while driving.   Please note: You were cared for by a hospitalist during your hospital stay. Once you are discharged, your primary care physician will handle any further medical issues. Please note that NO REFILLS for any discharge medications will be authorized once you are discharged, as it is imperative that you return to your primary care physician (or establish a relationship with a primary care physician if you do not have one) for your post hospital discharge needs so that they can reassess your need for medications and monitor your lab values.  Time coordinating discharge: 35 minutes  SIGNED:  Nilda Fendt, MD, PhD 04/08/2024, 10:50 AM

## 2024-05-14 ENCOUNTER — Other Ambulatory Visit (INDEPENDENT_AMBULATORY_CARE_PROVIDER_SITE_OTHER): Payer: Self-pay | Admitting: Nurse Practitioner

## 2024-05-14 DIAGNOSIS — I701 Atherosclerosis of renal artery: Secondary | ICD-10-CM

## 2024-05-16 ENCOUNTER — Encounter (INDEPENDENT_AMBULATORY_CARE_PROVIDER_SITE_OTHER)

## 2024-05-16 ENCOUNTER — Ambulatory Visit (INDEPENDENT_AMBULATORY_CARE_PROVIDER_SITE_OTHER): Admitting: Nurse Practitioner

## 2024-06-20 ENCOUNTER — Encounter (INDEPENDENT_AMBULATORY_CARE_PROVIDER_SITE_OTHER)

## 2024-06-20 ENCOUNTER — Ambulatory Visit (INDEPENDENT_AMBULATORY_CARE_PROVIDER_SITE_OTHER): Admitting: Nurse Practitioner

## 2024-06-26 ENCOUNTER — Encounter: Payer: Self-pay | Admitting: Obstetrics

## 2024-06-26 ENCOUNTER — Encounter: Admitting: Obstetrics

## 2024-06-26 NOTE — Progress Notes (Deleted)
 ANNUAL PREVENTATIVE CARE GYNECOLOGY  ENCOUNTER NOTE  SUBJECTIVE:       Melody Stone is a 73 y.o. G0P0000 female here for a routine annual gynecologic exam. The patient is not sexually active. The patient {is/is not:13135} taking hormone replacement therapy. {post-men bleed:13152::Patient denies post-menopausal vaginal bleeding.} Family history of breast, uterine, ovarian cancer: {yes/no:311178}. The patient wears seatbelts: {yes/no:311178}. The patient participates in regular exercise: {yes/no/not asked:9010}. Has the patient ever been transfused or tattooed?: {yes/no/not asked:9010}. The patient reports that there {is/is not:9024} domestic violence in her life. Has the patient completed the Gardasil vaccine? no.  Current complaints: 1.  ***    Gynecologic History No LMP recorded. Patient has had a hysterectomy. Contraception: post menopausal status Last Pap: unsure, post total abdominal hysterectomy with BSO History of abnormal pap: unsure History of STIs: no Last Mammogram: 04/30/2020. Results were: normal Last Colonoscopy:  Last Dexa Scan:   PHQ-2:      No data to display          Obstetric History OB History  Gravida Para Term Preterm AB Living  0 0 0 0 0 0  SAB IAB Ectopic Multiple Live Births  0 0 0 0 0    Past Medical History:  Diagnosis Date   Anemia    Anxiety    Aortic insufficiency    a. 10/2015 Echo: Nl LV fxn, mild to moderate AI; b. 02/2017 Echo: EF 60-65%, no rwma, mild AI.   Autistic disorder    nonverbal at baseline   Cataracts, bilateral    Chest pain    a. 10/2015 Ex MV Avelina): EF 69%, no ischemia; b. 01/2017 Lexiscan  MV: EF 45%, no ischemia/infarct-->Low risk.   Dementia (HCC)    Dyspnea on exertion    Esophagitis    Hepatitis C    HLD (hyperlipidemia)    Hypertension    Intermittent explosive disorder    Mental retardation    Panic disorder    Reflux esophagitis    Renal failure     Family History  Problem Relation Age of Onset    Heart attack Mother        CABG in her 87's   Cancer Mother    CAD Brother        71   Hypertension Brother        42    Past Surgical History:  Procedure Laterality Date   ABDOMINAL HYSTERECTOMY     APPENDECTOMY     ESOPHAGOGASTRODUODENOSCOPY (EGD) WITH PROPOFOL  N/A 02/07/2023   Procedure: ESOPHAGOGASTRODUODENOSCOPY (EGD) WITH PROPOFOL ;  Surgeon: Therisa Bi, MD;  Location: [facility] ENDOSCOPY;  Service: Gastroenterology;  Laterality: N/A;  [name] Taylor - BROTHER IS LEGAL GUARDIAN WILL GIVE VERBAL CONSENT ON PHONE # [phone] PATIENT IS IN [facility]   ORIF FEMUR FRACTURE Right 04/01/2024   Procedure: OPEN REDUCTION INTERNAL FIXATION (ORIF) DISTAL FEMUR FRACTURE;  Surgeon: Celena Sharper, MD;  Location: [facility] OR;  Service: Orthopedics;  Laterality: Right;   RENAL ANGIOGRAPHY N/A 12/15/2022   Procedure: RENAL ANGIOGRAPHY;  Surgeon: Marea Selinda RAMAN, MD;  Location: [facility] INVASIVE CV LAB;  Service: Cardiovascular;  Laterality: N/A;    Social History   Socioeconomic History   Marital status: Single    Spouse name: Not on file   Number of children: Not on file   Years of education: Not on file   Highest education level: Not on file  Occupational History   Not on file  Tobacco Use   Smoking status: Never  Smokeless tobacco: Never  Vaping Use   Vaping status: Never Used  Substance and Sexual Activity   Alcohol use: No   Drug use: No   Sexual activity: Not on file  Other Topics Concern   Not on file  Social History Narrative   Patient lives in a group home.  She participates in activities regularly.  Her exercise tolerance has fallen off over the past year.  She is no longer routinely exercising/going for walks.   Social Drivers of Corporate Investment Banker Strain: Not on file  Food Insecurity: Patient Unable To Answer (04/02/2024)   Hunger Vital Sign    Worried About Running Out of Food in the Last Year: Patient unable to answer    Ran Out of Food in the Last Year:  Patient unable to answer  Transportation Needs: Patient Unable To Answer (04/02/2024)   PRAPARE - Transportation    Lack of Transportation (Medical): Patient unable to answer    Lack of Transportation (Non-Medical): Patient unable to answer  Physical Activity: Not on file  Stress: Not on file  Social Connections: Patient Unable To Answer (04/02/2024)   Social Connection and Isolation Panel    Frequency of Communication with Friends and Family: Patient unable to answer    Frequency of Social Gatherings with Friends and Family: Patient unable to answer    Attends Religious Services: Patient unable to answer    Active Member of Clubs or Organizations: Patient unable to answer    Attends Banker Meetings: Patient unable to answer    Marital Status: Patient unable to answer  Intimate Partner Violence: Patient Unable To Answer (04/02/2024)   Humiliation, Afraid, Rape, and Kick questionnaire    Fear of Current or Ex-Partner: Patient unable to answer    Emotionally Abused: Patient unable to answer    Physically Abused: Patient unable to answer    Sexually Abused: Patient unable to answer    Current Outpatient Medications on File Prior to Visit  Medication Sig Dispense Refill   acetaminophen  (TYLENOL ) 500 MG tablet Take 2 tablets (1,000 mg total) by mouth every 8 (eight) hours as needed for mild pain (pain score 1-3). 30 tablet 0   Aloe Vera OIL Apply 1 Application topically as directed. Cream     aspirin  EC 81 MG tablet Take 81 mg by mouth in the morning. Swallow whole.     atorvastatin  (LIPITOR) 10 MG tablet Take 1 tablet (10 mg total) by mouth daily. (Patient taking differently: Take 10 mg by mouth in the morning.) 30 tablet 11   Brimonidine Tartrate (LUMIFY) 0.025 % SOLN Place 1 drop into both eyes in the morning.     chlorhexidine  (PERIDEX ) 0.12 % solution Use as directed 15 mLs in the mouth or throat 2 (two) times daily.     Cholecalciferol (VITAMIN D3) 1000 units CAPS Take 2,000  Units by mouth in the morning.     clonazePAM  (KLONOPIN ) 0.5 MG tablet Take 0.5 tablets (0.25 mg total) by mouth at bedtime. 4 tablet 0   clopidogrel  (PLAVIX ) 75 MG tablet Take 1 tablet (75 mg total) by mouth daily. (Patient taking differently: Take 75 mg by mouth in the morning.) 30 tablet 11   docusate sodium  (COLACE) 100 MG capsule Take 200 mg by mouth in the morning.     donepezil  (ARICEPT ) 10 MG tablet Take 10 mg by mouth in the morning.     loratadine (CLARITIN) 10 MG tablet Take 10 mg by mouth daily as  needed for allergies.     memantine  (NAMENDA ) 10 MG tablet Take 10 mg by mouth 2 (two) times daily.     oxyCODONE  (OXY IR/ROXICODONE ) 5 MG immediate release tablet Take 0.5 tablets (2.5 mg total) by mouth every 4 (four) hours as needed for moderate pain (pain score 4-6) or severe pain (pain score 7-10). 30 tablet 0   pantoprazole  (PROTONIX ) 40 MG tablet Take 1 tablet (40 mg total) by mouth daily for 14 days. (Patient taking differently: Take 40 mg by mouth 2 (two) times daily.) 14 tablet 0   polyethylene glycol (MIRALAX ) 17 g packet Take 17 g by mouth daily. (Patient taking differently: Take 17 g by mouth daily as needed for mild constipation or moderate constipation.) 14 each 0   QUEtiapine  (SEROQUEL ) 25 MG tablet Take 25 mg by mouth at bedtime.     senna (SENOKOT) 8.6 MG tablet Take 1 tablet by mouth in the morning.     triamcinolone cream (KENALOG) 0.1 % Apply 1 Application topically as needed.     venlafaxine  XR (EFFEXOR -XR) 75 MG 24 hr capsule Take 75 mg by mouth in the morning.     verapamil  (CALAN -SR) 180 MG CR tablet Take 180 mg by mouth 2 (two) times daily.      [DISCONTINUED] PARoxetine (PAXIL) 30 MG tablet Take 30 mg by mouth at bedtime.      No current facility-administered medications on file prior to visit.    Allergies  Allergen Reactions   Other Other (See Comments)    Psychotropic drugs cause kidney failure. Psychotropic drug cause renal failure. Psychotropic drugs  cause kidney failure.   Ciprofloxacin Rash    Per caregiver.     Review of Systems ROS Review of Systems - General ROS: negative for - chills, fatigue, fever, hot flashes, night sweats, weight gain or weight loss Psychological ROS: negative for - anxiety, decreased libido, depression, mood swings, physical abuse or sexual abuse Ophthalmic ROS: negative for - blurry vision, eye pain or loss of vision ENT ROS: negative for - headaches, hearing change, visual changes or vocal changes Allergy and Immunology ROS: negative for - hives, itchy/watery eyes or seasonal allergies Hematological and Lymphatic ROS: negative for - bleeding problems, bruising, swollen lymph nodes or weight loss Endocrine ROS: negative for - galactorrhea, hair pattern changes, hot flashes, malaise/lethargy, mood swings, palpitations, polydipsia/polyuria, skin changes, temperature intolerance or unexpected weight changes Breast ROS: negative for - new or changing breast lumps or nipple discharge Respiratory ROS: negative for - cough or shortness of breath Cardiovascular ROS: negative for - chest pain, irregular heartbeat, palpitations or shortness of breath Gastrointestinal ROS: no abdominal pain, change in bowel habits, or black or bloody stools Genito-Urinary ROS: no dysuria, trouble voiding, or hematuria Musculoskeletal ROS: negative for - joint pain or joint stiffness Neurological ROS: negative for - bowel and bladder control changes Dermatological ROS: negative for rash and skin lesion changes   OBJECTIVE:   There were no vitals taken for this visit.  CONSTITUTIONAL: Well-developed, well-nourished female in no acute distress.  PSYCHIATRIC: Normal mood and affect. Normal behavior. Normal judgment and thought content. NEUROLGIC: Alert and oriented to person, place, and time. Normal muscle tone coordination. No cranial nerve deficit noted. HENT:  Normocephalic, atraumatic, External right and left ear normal.  Oropharynx is clear and moist EYES: Conjunctivae and EOM are normal. No scleral icterus.  NECK: Normal range of motion, supple, no masses.  Normal thyroid.  SKIN: Skin is warm and dry. No rash noted.  Not diaphoretic. No erythema. No pallor. CARDIOVASCULAR: Normal heart rate noted, regular rhythm, no murmur. RESPIRATORY: Clear to auscultation bilaterally. Effort and breath sounds normal, no problems with respiration noted. BREASTS: Symmetric in size. No masses, skin changes, nipple drainage, or lymphadenopathy. ABDOMEN: Soft, normal bowel sounds, no distention noted.  No tenderness, rebound or guarding.  PELVIC:  Bladder {:311640}  Urethra: {:311719}  Vulva: {:311722}  Vagina: {:311643}  Cervix: {:311644}  Uterus: {:311718}  Adnexa: {:311645}  RV: {Blank multiple:19196::External Exam NormaI,No Rectal Masses,Normal Sphincter tone}  MUSCULOSKELETAL: Normal range of motion. No tenderness.  No cyanosis, clubbing, or edema.  2+ distal pulses. LYMPHATIC: No Axillary, Supraclavicular, or Inguinal Adenopathy.  Labs: Lab Results  Component Value Date   WBC 9.8 04/07/2024   HGB 7.9 (L) 04/07/2024   HCT 24.3 (L) 04/07/2024   MCV 92.4 04/07/2024   PLT 199 04/07/2024    Lab Results  Component Value Date   CREATININE 0.71 04/07/2024   BUN 40 (H) 04/07/2024   NA 144 04/07/2024   K 3.9 04/07/2024   CL 111 04/07/2024   CO2 23 04/07/2024    Lab Results  Component Value Date   ALT 16 04/01/2024   AST 23 04/01/2024   ALKPHOS 90 04/01/2024   BILITOT 1.0 04/01/2024    No results found for: CHOL, HDL, LDLCALC, LDLDIRECT, TRIG, CHOLHDL  No results found for: TSH  No results found for: HGBA1C   ASSESSMENT:   No diagnosis found.   PLAN:   Nadean Montanaro is a 73 y.o. G0P0000 female here today for her annual exam, doing well.  Pap: done with cotesting today Mammogram: ordered***due *** Colon: PCP *** ordered colonoscopy***Cologuard -OR- due *** Labs: ***A1C,  CMP, HepC, Lipid panel, Vit D, TSH PHQ-2 = ***, discussed coping techniques; RTC if worsens or develops concern Contraception: *** Healthy lifestyle modifications discussed: multivitamin, diet, exercise, sunscreen, tobacco and alcohol use. Emphasized importance of regular physical activity.  Calcium  and Vit D recommendation reviewed.  All questions answered to patient's satisfaction.   Follow up 1 yr for annual, sooner prn.    Estil Mangle, DO [facility]

## 2024-06-27 NOTE — Progress Notes (Signed)
 ANNUAL EXAM  Melody Stone is a 73 y.o. female  CHIEF COMPLAINT: Chief Complaint  Patient presents with  . Annual Exam    SUBJECTIVE:  Patient has gone a intermediate care facility at [facility].  No focal complaints.  Getting physical therapy to help with the walking.  Appetite is reasonable.  Labs 1 month ago overall look good ______________________________________________________________________ A comprehensive ROS was negative in all 10 systems reviewed.  ALLERGIES: Ciprofloxacin  Past Medical History:  Diagnosis Date  . Anxiety, generalized   . Benign essential hypertension 03/30/2016  . Esophagitis   . GAD (generalized anxiety disorder)   . HTN (hypertension)   . Hx of renal failure   . Intermittent explosive disorder   . 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 Nuclear sclerotic cataract of both eyes   . Panic disorder without agoraphobia   . Reflux esophagitis   . Severe intellectual disabilities     Past Surgical History:  Procedure Laterality Date  . femur surgery Right 04/2024  . APPENDECTOMY    . OTHER SURGERY     Stent placement per caregiver 12/16/2022.  SABRA TAH-BSO      Current Outpatient Medications  Medication Sig Dispense Refill  . acetaminophen  (TYLENOL ) 500 MG tablet Take 1 tablet (500 mg total) by mouth every 6 (six) hours as needed for Pain 100 tablet 4  . ALLERGY RELIEF, LORATADINE, 10 mg tablet TAKE 1 TABLET BY MOUTH ONCE DAILY AS NEEDED 30 tablet 10  . ALOE VERA TOPICAL Apply topically Cream as directed as needed    . aspirin  81 MG EC tablet TAKE 1 TABLET BY MOUTH EVERY MORNING *DO NOT CRUSH OR CHEW* 4 tablet 10  . atorvastatin  (LIPITOR) 10 MG tablet TAKE 1 TABLET BY MOUTH ONCE DAILY 30 tablet 3  . chlorhexidine  (PERIDEX ) 0.12 % solution Take 15 mls twice a day    . cholecalciferol (VITAMIN D3) 1000 unit tablet TAKE 2 TABLETS (2000 INTERNATIONAL UNITS) BY MOUTH ONCE DAILY FOR SUPPLEMENT 6 tablet 10  . clonazePAM  (KLONOPIN )  0.5 MG tablet TAKE 1/2 TABLET BY MOUTH EVERY NIGHT AT BEDTIME *HAZARDOUS DRUG: WEAR GLOVES* 30 tablet 5  . clopidogreL  (PLAVIX ) 75 mg tablet TAKE 1 TABLET BY MOUTH EVERY MORNING 4 tablet 10  . docusate (COLACE) 100 MG capsule TAKE 2 CAPSULES =200MG  BY MOUTH EVERY MORNING *DO NOT CRUSH OR CHEW* 8 capsule 10  . donepeziL  (ARICEPT ) 10 MG tablet TAKE 1 TABLET BY MOUTH EVERY MORNING 4 tablet 10  . LUMIFY 0.025 % Drop APPLY 1 DROP INTO EACH EYE ONCE DAILY. 2.5 mL 11  . memantine  (NAMENDA ) 10 MG tablet TAKE 1 TABLET BY MOUTH TWICE DAILY 9 tablet 10  . oxyCODONE  (ROXICODONE ) 5 MG immediate release tablet Take 2.5 mg by mouth    . pantoprazole  (PROTONIX ) 40 MG DR tablet TAKE 1 TABLET BY MOUTH TWICE A DAY (7AM AND 5PM) 90 tablet 3  . polyethylene glycol (MIRALAX ) powder Take 17 g by mouth once daily as needed for Constipation Mix in 4-8ounces of fluid prior to taking. 238 g 1  . QUEtiapine  (SEROQUEL ) 25 MG tablet TAKE 1 TABLET BY MOUTH ONCE DAILY 30 tablet 3  . SENNA 8.6 mg tablet TAKE 1 TABLET BY MOUTH EVERY MORNING 4 tablet 10  . venlafaxine  (EFFEXOR -XR) 75 MG XR capsule TAKE 1 CAPSULE BY MOUTH EVERY MORNING *DO NOT CRUSH OR CHEW* 4 capsule 10  . verapamiL  (CALAN -SR) 180 MG SR tablet TAKE 1 TABLET BY MOUTH  TWICE DAILY *DO NOT CRUSH OR CHEW* 9 tablet 10  . VITAMIN D3 25 mcg (1,000 unit) capsule TAKE 2 CAPSULES (2000 UNITS) BY MOUTH EVERY MORNING 8 capsule 10   No current facility-administered medications for this visit.    PHYSICAL EXAM: There were no vitals taken for this visit. There is no height or weight on file to calculate BMI.  Wt Readings from Last 3 Encounters:  06/03/24 57.6 kg (127 lb)  09/01/23 50.5 kg (111 lb 6.4 oz)  06/27/23 53.5 kg (118 lb)     BP Readings from Last 3 Encounters:  06/03/24 (!) 140/73  09/01/23 130/60  06/27/23 110/70    General: Alert oriented x3  Skin: No suspicious lesions or moles.   Eyes: Sclera and conjunctiva clear; pupils equal round and reactive to  light and accommodation; extraocular movements intact Ears: External ears and canal normal; tympanic membranes normal.   Nose: Mucosa healthy without drainage or ulceration Oropharynx: No suspicious lesions Neck: No swelling, masses, stiffness, pain, limited movement, carotid pulses normal bilaterally, thyroid normal size, no masses palpated. No bruits heard. Lungs: Respirations unlabored; clear to auscultation bilaterally Back: No spinal deformity Cardiovascular: Heart regular rate and rhythm without murmurs, gallops, or rubs Abdomen: Soft; non tender; non distended;  no masses or organomegaly Lymph Nodes: No significant cervical, supraclavicular, or axillary lymphadenopathy noted Musculoskeletal: No active joint inflammation Extremities: Normal, no edema Pulses: Dorsalis pedis palpable and symmetric bilaterally Neurologic: Alert and oriented times 3; speech intact; face symmetrical; moves all extremities well    ASSESSMENT/PLAN  Anxiety/severe depression-on multiple medications, seems reasonable to help her outbursts. Alzheimer's dementia-on medication, seems reasonably stable Lower extremity weakness-getting physical therapy for transferring and walking better Hyperlipidemia-numbers good on Lipitor Aortic insufficiency-no sign for decompensation, no fluid retention   Goals Addressed             This Visit's Progress   . Maintain health/healthy lifestyle   On track       .phq2  Return in about 6 months (around 12/26/2024) for followup.

## 2024-07-01 ENCOUNTER — Encounter: Payer: Self-pay | Admitting: Registered Nurse

## 2024-07-01 ENCOUNTER — Ambulatory Visit: Admitting: Registered Nurse

## 2024-07-01 DIAGNOSIS — N760 Acute vaginitis: Secondary | ICD-10-CM

## 2024-07-01 DIAGNOSIS — B9689 Other specified bacterial agents as the cause of diseases classified elsewhere: Secondary | ICD-10-CM | POA: Insufficient documentation

## 2024-07-01 MED ORDER — METRONIDAZOLE 500 MG PO TABS: 500.0000 mg | ORAL_TABLET | Freq: Two times a day (BID) | ORAL | 0 refills | Status: AC

## 2024-07-01 NOTE — Progress Notes (Signed)
 ANNUAL GYNECOLOGICAL EXAM  HPI  Latisia Hilaire Kirchhoff is a 73 y.o.-year-old G0P0000 who presents for a problem visit today.  She has a caretaker with her. Ms. Haslem is unable to tell me what symptoms she's noticing. Her carer says that other care providers have noticed a fishy vaginal odor for the last few weeks. Ms. Torpey is incontinent and wears incontinence underwear.  Medical/Surgical History Past Medical History:  Diagnosis Date   Anemia    Anxiety    Aortic insufficiency    a. 10/2015 Echo: Nl LV fxn, mild to moderate AI; b. 02/2017 Echo: EF 60-65%, no rwma, mild AI.   Autistic disorder    nonverbal at baseline   Cataracts, bilateral    Chest pain    a. 10/2015 Ex MV Avelina): EF 69%, no ischemia; b. 01/2017 Lexiscan  MV: EF 45%, no ischemia/infarct-->Low risk.   Dementia (HCC)    Dyspnea on exertion    Esophagitis    Hepatitis C    HLD (hyperlipidemia)    Hypertension    Intermittent explosive disorder    Mental retardation    Panic disorder    Reflux esophagitis    Renal failure    Past Surgical History:  Procedure Laterality Date   ABDOMINAL HYSTERECTOMY     APPENDECTOMY     ESOPHAGOGASTRODUODENOSCOPY (EGD) WITH PROPOFOL  N/A 02/07/2023   Procedure: ESOPHAGOGASTRODUODENOSCOPY (EGD) WITH PROPOFOL ;  Surgeon: Therisa Bi, MD;  Location: [facility] ENDOSCOPY;  Service: Gastroenterology;  Laterality: N/A;  [name] Rottman - BROTHER IS LEGAL GUARDIAN WILL GIVE VERBAL CONSENT ON PHONE # [phone] PATIENT IS IN [facility]   ORIF FEMUR FRACTURE Right 04/01/2024   Procedure: OPEN REDUCTION INTERNAL FIXATION (ORIF) DISTAL FEMUR FRACTURE;  Surgeon: Celena Sharper, MD;  Location: [facility] OR;  Service: Orthopedics;  Laterality: Right;   RENAL ANGIOGRAPHY N/A 12/15/2022   Procedure: RENAL ANGIOGRAPHY;  Surgeon: Marea Selinda RAMAN, MD;  Location: [facility] INVASIVE CV LAB;  Service: Cardiovascular;  Laterality: N/A;    Social History Lives in an assisted living facility  Obstetric History OB History      Gravida  0   Para  0   Term  0   Preterm  0   AB  0   Living  0      SAB  0   IAB  0   Ectopic  0   Multiple  0   Live Births  0            GYN/Menstrual History Postmenopausal, hx of TAH w/ BSO  Prevention Has PCP  Current Medications Outpatient Medications Prior to Visit  Medication Sig   acetaminophen  (TYLENOL ) 500 MG tablet Take 2 tablets (1,000 mg total) by mouth every 8 (eight) hours as needed for mild pain (pain score 1-3).   Aloe Vera OIL Apply 1 Application topically as directed. Cream   aspirin  EC 81 MG tablet Take 81 mg by mouth in the morning. Swallow whole.   atorvastatin  (LIPITOR) 10 MG tablet Take 1 tablet (10 mg total) by mouth daily. (Patient taking differently: Take 10 mg by mouth in the morning.)   Brimonidine Tartrate (LUMIFY) 0.025 % SOLN Place 1 drop into both eyes in the morning.   chlorhexidine  (PERIDEX ) 0.12 % solution Use as directed 15 mLs in the mouth or throat 2 (two) times daily.   Cholecalciferol (VITAMIN D3) 1000 units CAPS Take 2,000 Units by mouth in the morning.   clonazePAM  (KLONOPIN ) 0.5 MG tablet Take 0.5 tablets (0.25 mg total)  by mouth at bedtime.   clopidogrel  (PLAVIX ) 75 MG tablet Take 1 tablet (75 mg total) by mouth daily. (Patient taking differently: Take 75 mg by mouth in the morning.)   docusate sodium  (COLACE) 100 MG capsule Take 200 mg by mouth in the morning.   donepezil  (ARICEPT ) 10 MG tablet Take 10 mg by mouth in the morning.   loratadine (CLARITIN) 10 MG tablet Take 10 mg by mouth daily as needed for allergies.   memantine  (NAMENDA ) 10 MG tablet Take 10 mg by mouth 2 (two) times daily.   oxyCODONE  (OXY IR/ROXICODONE ) 5 MG immediate release tablet Take 0.5 tablets (2.5 mg total) by mouth every 4 (four) hours as needed for moderate pain (pain score 4-6) or severe pain (pain score 7-10).   pantoprazole  (PROTONIX ) 40 MG tablet Take 1 tablet (40 mg total) by mouth daily for 14 days. (Patient taking differently:  Take 40 mg by mouth 2 (two) times daily.)   polyethylene glycol (MIRALAX ) 17 g packet Take 17 g by mouth daily. (Patient taking differently: Take 17 g by mouth daily as needed for mild constipation or moderate constipation.)   QUEtiapine  (SEROQUEL ) 25 MG tablet Take 25 mg by mouth at bedtime.   senna (SENOKOT) 8.6 MG tablet Take 1 tablet by mouth in the morning.   triamcinolone cream (KENALOG) 0.1 % Apply 1 Application topically as needed.   venlafaxine  XR (EFFEXOR -XR) 75 MG 24 hr capsule Take 75 mg by mouth in the morning.   verapamil  (CALAN -SR) 180 MG CR tablet Take 180 mg by mouth 2 (two) times daily.    No facility-administered medications prior to visit.    ROS: Patient is unable to participate in ROS   OBJECTIVE  There were no vitals taken for this visit.   Physical examination General Vocal, but non-verbal  Neuro: Wheelchair bound. Moves all extremities. Normal mood. Normal affect.     Pelvic: Unable to consent    ASSESSMENT  1) Based on history, suspicious for BV 2) Patient unable to consent to genital exam or specimen collection 3) Patient unable to self swab for vaginitis.  PLAN 1) Presumptive tx for BV with po metronidazole  500mg  bid x7d. Okay to crush tablets, prn. 2) RTC if sx return or do not resolve.   Lauraine Lakes, CNM

## 2024-07-12 ENCOUNTER — Ambulatory Visit (INDEPENDENT_AMBULATORY_CARE_PROVIDER_SITE_OTHER): Admitting: Nurse Practitioner

## 2024-07-12 ENCOUNTER — Ambulatory Visit (INDEPENDENT_AMBULATORY_CARE_PROVIDER_SITE_OTHER)

## 2024-07-29 DEATH — deceased

## 2024-08-14 ENCOUNTER — Ambulatory Visit: Admitting: Certified Nurse Midwife

## 2024-08-15 ENCOUNTER — Encounter (INDEPENDENT_AMBULATORY_CARE_PROVIDER_SITE_OTHER): Payer: Self-pay

## 2024-08-15 ENCOUNTER — Encounter (INDEPENDENT_AMBULATORY_CARE_PROVIDER_SITE_OTHER)

## 2024-08-15 ENCOUNTER — Ambulatory Visit (INDEPENDENT_AMBULATORY_CARE_PROVIDER_SITE_OTHER): Admitting: Nurse Practitioner
